# Patient Record
Sex: Male | Born: 1940 | Race: White | Hispanic: No | Marital: Married | Smoking: Former smoker
Health system: Southern US, Community
[De-identification: ages and names within clinical notes are randomized; demographics above are authoritative.]

## PROBLEM LIST (undated history)

## (undated) DIAGNOSIS — E7800 Pure hypercholesterolemia, unspecified: Secondary | ICD-10-CM

## (undated) DIAGNOSIS — I2510 Atherosclerotic heart disease of native coronary artery without angina pectoris: Secondary | ICD-10-CM

## (undated) DIAGNOSIS — J189 Pneumonia, unspecified organism: Secondary | ICD-10-CM

## (undated) DIAGNOSIS — I10 Essential (primary) hypertension: Secondary | ICD-10-CM

## (undated) DIAGNOSIS — E119 Type 2 diabetes mellitus without complications: Secondary | ICD-10-CM

## (undated) HISTORY — PX: BACK SURGERY: SHX140

## (undated) HISTORY — PX: ROTATOR CUFF REPAIR: SHX139

## (undated) HISTORY — PX: CARDIAC SURGERY: SHX584

## (undated) HISTORY — PX: CORONARY ARTERY BYPASS GRAFT: SHX141

## (undated) HISTORY — PX: APPENDECTOMY: SHX54

## (undated) HISTORY — PX: KNEE SURGERY: SHX244

---

## 2010-07-07 ENCOUNTER — Ambulatory Visit: Admission: RE | Admit: 2010-07-07 | Discharge: 2010-07-07 | Payer: Self-pay | Source: Home / Self Care

## 2010-07-08 ENCOUNTER — Ambulatory Visit: Admit: 2010-07-08 | Payer: Self-pay

## 2010-07-08 NOTE — Assessment & Plan Note (Unsigned)
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  The patient is doing well.  His sternum is stable.  His wounds are well healed.  He is walking for exercise.  He has seen the cardiologist and they are pleased with his progress.  The patient was reminded about his sternal precautions, not to lift anything more than 15 pounds for the next 6 weeks.  He will continue his followup with the cardiologist and he will return to see [place] on a p.r.n. basis.  Tera Mater. Arvilla Market, MD  BC/MEDQ  D:  07/07/2010  T:  07/08/2010  Job:  [id number]

## 2010-07-15 DIAGNOSIS — Z0279 Encounter for issue of other medical certificate: Secondary | ICD-10-CM

## 2013-02-11 ENCOUNTER — Encounter (HOSPITAL_BASED_OUTPATIENT_CLINIC_OR_DEPARTMENT_OTHER): Payer: Self-pay | Admitting: Emergency Medicine

## 2013-02-11 ENCOUNTER — Emergency Department (HOSPITAL_BASED_OUTPATIENT_CLINIC_OR_DEPARTMENT_OTHER)
Admission: EM | Admit: 2013-02-11 | Discharge: 2013-02-12 | Disposition: A | Discharge status: Home / Self Care | Payer: Medicare Other | Attending: Emergency Medicine | Admitting: Emergency Medicine

## 2013-02-11 DIAGNOSIS — C7A020 Malignant carcinoid tumor of the appendix: Secondary | ICD-10-CM | POA: Insufficient documentation

## 2013-02-11 DIAGNOSIS — E119 Type 2 diabetes mellitus without complications: Secondary | ICD-10-CM | POA: Insufficient documentation

## 2013-02-11 DIAGNOSIS — Z7982 Long term (current) use of aspirin: Secondary | ICD-10-CM | POA: Insufficient documentation

## 2013-02-11 DIAGNOSIS — Z951 Presence of aortocoronary bypass graft: Secondary | ICD-10-CM | POA: Insufficient documentation

## 2013-02-11 DIAGNOSIS — Z79899 Other long term (current) drug therapy: Secondary | ICD-10-CM | POA: Insufficient documentation

## 2013-02-11 DIAGNOSIS — N200 Calculus of kidney: Secondary | ICD-10-CM | POA: Insufficient documentation

## 2013-02-11 DIAGNOSIS — I2510 Atherosclerotic heart disease of native coronary artery without angina pectoris: Secondary | ICD-10-CM | POA: Insufficient documentation

## 2013-02-11 DIAGNOSIS — Z9889 Other specified postprocedural states: Secondary | ICD-10-CM | POA: Insufficient documentation

## 2013-02-11 DIAGNOSIS — E7800 Pure hypercholesterolemia, unspecified: Secondary | ICD-10-CM | POA: Insufficient documentation

## 2013-02-11 DIAGNOSIS — R112 Nausea with vomiting, unspecified: Secondary | ICD-10-CM | POA: Insufficient documentation

## 2013-02-11 DIAGNOSIS — D3A020 Benign carcinoid tumor of the appendix: Secondary | ICD-10-CM

## 2013-02-11 DIAGNOSIS — I10 Essential (primary) hypertension: Secondary | ICD-10-CM | POA: Insufficient documentation

## 2013-02-11 HISTORY — DX: Essential (primary) hypertension: I10

## 2013-02-11 HISTORY — DX: Pure hypercholesterolemia, unspecified: E78.00

## 2013-02-11 HISTORY — DX: Atherosclerotic heart disease of native coronary artery without angina pectoris: I25.10

## 2013-02-11 HISTORY — DX: Type 2 diabetes mellitus without complications: E11.9

## 2013-02-11 LAB — URINALYSIS, ROUTINE W REFLEX MICROSCOPIC
Glucose, UA: >1000 mg/dL — AB
Leukocytes, UA: NEGATIVE
Specific Gravity, Urine: 1.026 (ref 1.005–1.030)
pH: 5.5 (ref 5.0–8.0)

## 2013-02-11 LAB — URINE MICROSCOPIC-ADD ON

## 2013-02-11 LAB — CBC WITH DIFFERENTIAL/PLATELET
Basophils Absolute: 0 10*3/uL (ref 0.0–0.1)
HCT: 46.7 % (ref 39.0–52.0)
Hemoglobin: 16.7 g/dL (ref 13.0–17.0)
Lymphocytes Relative: 8 % — ABNORMAL LOW (ref 12–46)
Monocytes Absolute: 0.6 10*3/uL (ref 0.1–1.0)
Monocytes Relative: 5 % (ref 3–12)
Neutro Abs: 11.4 10*3/uL — ABNORMAL HIGH (ref 1.7–7.7)
Neutrophils Relative %: 87 % — ABNORMAL HIGH (ref 43–77)
RBC: 4.95 MIL/uL (ref 4.22–5.81)
WBC: 13.1 10*3/uL — ABNORMAL HIGH (ref 4.0–10.5)

## 2013-02-11 NOTE — ED Notes (Signed)
Pt states he is having LLQ abdominal pain since this am.  Pt vomited after eating lunch.  Pt states he feels bloated.

## 2013-02-12 ENCOUNTER — Emergency Department (HOSPITAL_BASED_OUTPATIENT_CLINIC_OR_DEPARTMENT_OTHER): Payer: Medicare Other

## 2013-02-12 LAB — BASIC METABOLIC PANEL
BUN: 26 mg/dL — ABNORMAL HIGH (ref 6–23)
CO2: 23 mEq/L (ref 19–32)
Chloride: 99 mEq/L (ref 96–112)
Creatinine, Ser: 1.1 mg/dL (ref 0.50–1.35)
GFR calc non Af Amer: 54 mL/min — ABNORMAL LOW (ref >90)
Glucose, Bld: 281 mg/dL — ABNORMAL HIGH (ref 70–99)
Potassium: 4.6 mEq/L (ref 3.5–5.1)
Potassium: 5.9 mEq/L — ABNORMAL HIGH (ref 3.5–5.1)
Sodium: 137 mEq/L (ref 135–145)

## 2013-02-12 MED ORDER — MORPHINE SULFATE 2 MG/ML IJ SOLN
2.0000 mg | Freq: Once | INTRAMUSCULAR | Status: AC
  Administered 2013-02-12: 2 mg via INTRAVENOUS
  Filled 2013-02-12: qty 1

## 2013-02-12 MED ORDER — ONDANSETRON HCL 4 MG/2ML IJ SOLN
4.0000 mg | Freq: Once | INTRAMUSCULAR | Status: AC
  Administered 2013-02-12: 4 mg via INTRAVENOUS
  Filled 2013-02-12: qty 2

## 2013-02-12 MED ORDER — HYDROCODONE-ACETAMINOPHEN 5-325 MG PO TABS: 1.0000 | ORAL_TABLET | Freq: Four times a day (QID) | ORAL | Status: DC | PRN

## 2013-02-12 MED ORDER — TAMSULOSIN HCL 0.4 MG PO CAPS: 0.4000 mg | ORAL_CAPSULE | Freq: Every day | ORAL | Status: DC

## 2013-02-12 MED ORDER — ONDANSETRON 8 MG PO TBDP: ORAL_TABLET | ORAL | Status: DC

## 2013-02-12 MED ORDER — KETOROLAC TROMETHAMINE 30 MG/ML IJ SOLN
30.0000 mg | Freq: Once | INTRAMUSCULAR | Status: AC
  Administered 2013-02-12: 30 mg via INTRAVENOUS
  Filled 2013-02-12: qty 1

## 2013-02-12 MED ORDER — IBUPROFEN 800 MG PO TABS: 800.0000 mg | ORAL_TABLET | Freq: Three times a day (TID) | ORAL | Status: DC

## 2013-02-12 MED ORDER — SULFAMETHOXAZOLE-TMP DS 800-160 MG PO TABS
1.0000 | ORAL_TABLET | Freq: Once | ORAL | Status: AC
  Administered 2013-02-12: 1 via ORAL
  Filled 2013-02-12: qty 1

## 2013-02-12 MED ORDER — SULFAMETHOXAZOLE-TMP DS 800-160 MG PO TABS: 1.0000 | ORAL_TABLET | Freq: Two times a day (BID) | ORAL | Status: DC

## 2013-02-12 NOTE — ED Notes (Signed)
Pt ambulating independently w/ steady gait on d/c in no acute distress, A&Ox4. D/c instructions reviewed w/ pt and family - pt and family deny any further questions or concerns at present. Rx given x5, Pt instructed to not use alcohol, drive, or operate heavy machinery while take the prescription pain medications as they could make him drowsy - pt verbalized understanding.

## 2013-02-12 NOTE — ED Provider Notes (Signed)
CSN: [id number]     Arrival date & time 02/11/13  2228 History   This chart was scribed for Michael Morton Smitty Cords, MD by Caryn Bee, ED Scribe. This patient was seen in room MH08/MH08 and the patient's care was started 12:12 AM.    Chief Complaint  Patient presents with  . Abdominal Pain  . Emesis   Patient is a 72 y.o. male presenting with abdominal pain and vomiting. The history is provided by the patient. No language interpreter was used.  Abdominal Pain Pain location:  L flank (right flank and inguinal area) Pain quality: aching and dull   Pain radiates to:  Does not radiate Pain severity:  Moderate Onset quality:  Gradual Duration:  1 day Timing:  Constant Progression:  Unchanged Chronicity:  New Context: not suspicious food intake   Relieved by:  Nothing Worsened by:  Nothing tried Associated symptoms: dysuria, hematuria, nausea and vomiting   Associated symptoms comment:  Hematuria Risk factors: being elderly   Emesis Associated symptoms: abdominal pain (right flank)    HPI Comments: Michael Morton is a 72 y.o. male who presents to the Emergency Department complaining of constant, unchanged left lower flank that began about 9 hours ago. He describes his pain as dull and aching. Pt has associated emesis (about 3-4 episodes today). He believes it may be related to a hamburger he ate today, but his wife thinks it's related to the intensity of the pain. Pt has associated dysuria and hematuria without clotting. His last normal BM was this morning. Pt feels bloated and states he was unable to pass gas today. Pt has a h/o kidney stones. Pt denies back pain or increased urinary frequency. He reports that he is not allergic to medications. Pt denies any h/o abdominal surgeries.   Past Medical History  Diagnosis Date  . Diabetes mellitus without complication   . Hypertension   . Hypercholesteremia   . Coronary artery disease    Past Surgical History  Procedure Laterality Date   . Cardiac surgery    . Back surgery    . Knee surgery    . Rotator cuff repair    . Coronary artery bypass graft     No family history on file. History  Substance Use Topics  . Smoking status: Former Games developer  . Smokeless tobacco: Not on file  . Alcohol Use: Yes    Review of Systems  Gastrointestinal: Positive for nausea, vomiting and abdominal pain (right flank).  Endocrine: Negative for polyuria.  Genitourinary: Positive for dysuria, hematuria and flank pain.  All other systems reviewed and are negative.    Allergies  Ace inhibitors and Oxycodone  Home Medications   Current Outpatient Rx  Name  Route  Sig  Dispense  Refill  . acyclovir (ZOVIRAX) 400 MG tablet   Oral   Take 400 mg by mouth every 4 (four) hours while awake.         Marland Kitchen aspirin 81 MG tablet   Oral   Take 81 mg by mouth daily.         Marland Kitchen etodolac (LODINE) 400 MG tablet   Oral   Take 400 mg by mouth 2 (two) times daily.         Marland Kitchen glyBURIDE (DIABETA) 5 MG tablet   Oral   Take 5 mg by mouth 2 (two) times daily with a meal.         . metoprolol (LOPRESSOR) 50 MG tablet   Oral   Take 50  mg by mouth 2 (two) times daily.         . nitroGLYCERIN (NITROSTAT) 0.4 MG SL tablet   Sublingual   Place 0.4 mg under the tongue every 5 (five) minutes as needed for chest pain.         . simvastatin (ZOCOR) 40 MG tablet   Oral   Take 40 mg by mouth every evening.         . traZODone (DESYREL) 50 MG tablet   Oral   Take 50 mg by mouth at bedtime.         Marland Kitchen venlafaxine (EFFEXOR) 50 MG tablet   Oral   Take 25 mg by mouth 3 (three) times daily.          BP 174/87  Pulse 85  Temp(Src) 98.7 F (37.1 C) (Oral)  Resp 16  Ht 6' (1.829 m)  Wt 200 lb (90.719 kg)  BMI 27.12 kg/m2  SpO2 99% Physical Exam  Nursing note and vitals reviewed. Constitutional: He is oriented to person, place, and time. He appears well-developed and well-nourished.  HENT:  Head: Normocephalic and atraumatic.   Mouth/Throat: Oropharynx is clear and moist.  Eyes: Pupils are equal, round, and reactive to light.  Neck: Normal range of motion. Neck supple.  Cardiovascular: Normal rate, regular rhythm and normal heart sounds.   Pulmonary/Chest: Effort normal and breath sounds normal. He has no wheezes. He has no rales.  Abdominal: Soft. Bowel sounds are normal. He exhibits no distension. There is no tenderness. There is no rebound and no guarding.  Musculoskeletal: Normal range of motion.  Neurological: He is alert and oriented to person, place, and time.  Skin: Skin is warm and dry.  Psychiatric: He has a normal mood and affect.    ED Course  Procedures (including critical care time) DIAGNOSTIC STUDIES: Oxygen Saturation is 99% on room air, normal by my interpretation.    COORDINATION OF CARE: 12:40 AM-Discussed treatment plan with pt at bedside and pt agreed to plan.   Labs Review Labs Reviewed  URINALYSIS, ROUTINE W REFLEX MICROSCOPIC - Abnormal; Notable for the following:    Glucose, UA >1000 (*)    Hgb urine dipstick TRACE (*)    Bilirubin Urine MODERATE (*)    Ketones, ur 15 (*)    All other components within normal limits  CBC WITH DIFFERENTIAL - Abnormal; Notable for the following:    WBC 13.1 (*)    Neutrophils Relative % 87 (*)    Neutro Abs 11.4 (*)    Lymphocytes Relative 8 (*)    All other components within normal limits  URINE MICROSCOPIC-ADD ON  BASIC METABOLIC PANEL  URINALYSIS, ROUTINE W REFLEX MICROSCOPIC   Imaging Review No results found.  MDM  No diagnosis found. Case d/w Dr. Ezzard Standing of surgery will need to follow up closely in the office for elective appendectomy.    Patient and wife informed of all CT findings including likely mass in the appendix.  Will need to follow up with urology and general surgery  Medical screening examination/treatment/procedure(s) were performed by non-physician practitioner and as supervising physician I was immediately available  for consultation/collaboration.     Jasmine Awe, MD 02/12/13 [phone]

## 2013-10-28 ENCOUNTER — Emergency Department (HOSPITAL_BASED_OUTPATIENT_CLINIC_OR_DEPARTMENT_OTHER): Payer: Medicare Other

## 2013-10-28 ENCOUNTER — Encounter (HOSPITAL_BASED_OUTPATIENT_CLINIC_OR_DEPARTMENT_OTHER): Payer: Self-pay | Admitting: Emergency Medicine

## 2013-10-28 ENCOUNTER — Emergency Department (HOSPITAL_BASED_OUTPATIENT_CLINIC_OR_DEPARTMENT_OTHER)
Admission: EM | Admit: 2013-10-28 | Discharge: 2013-10-28 | Disposition: A | Discharge status: Home / Self Care | Payer: Medicare Other | Attending: Emergency Medicine | Admitting: Emergency Medicine

## 2013-10-28 DIAGNOSIS — Z951 Presence of aortocoronary bypass graft: Secondary | ICD-10-CM | POA: Insufficient documentation

## 2013-10-28 DIAGNOSIS — R5381 Other malaise: Secondary | ICD-10-CM | POA: Insufficient documentation

## 2013-10-28 DIAGNOSIS — Z7982 Long term (current) use of aspirin: Secondary | ICD-10-CM | POA: Insufficient documentation

## 2013-10-28 DIAGNOSIS — E119 Type 2 diabetes mellitus without complications: Secondary | ICD-10-CM | POA: Insufficient documentation

## 2013-10-28 DIAGNOSIS — R079 Chest pain, unspecified: Secondary | ICD-10-CM | POA: Insufficient documentation

## 2013-10-28 DIAGNOSIS — Z792 Long term (current) use of antibiotics: Secondary | ICD-10-CM | POA: Insufficient documentation

## 2013-10-28 DIAGNOSIS — Z791 Long term (current) use of non-steroidal anti-inflammatories (NSAID): Secondary | ICD-10-CM | POA: Insufficient documentation

## 2013-10-28 DIAGNOSIS — IMO0001 Reserved for inherently not codable concepts without codable children: Secondary | ICD-10-CM | POA: Insufficient documentation

## 2013-10-28 DIAGNOSIS — I2510 Atherosclerotic heart disease of native coronary artery without angina pectoris: Secondary | ICD-10-CM | POA: Insufficient documentation

## 2013-10-28 DIAGNOSIS — R531 Weakness: Secondary | ICD-10-CM

## 2013-10-28 DIAGNOSIS — I10 Essential (primary) hypertension: Secondary | ICD-10-CM | POA: Insufficient documentation

## 2013-10-28 DIAGNOSIS — K5900 Constipation, unspecified: Secondary | ICD-10-CM | POA: Insufficient documentation

## 2013-10-28 DIAGNOSIS — Z79899 Other long term (current) drug therapy: Secondary | ICD-10-CM | POA: Insufficient documentation

## 2013-10-28 DIAGNOSIS — R5383 Other fatigue: Principal | ICD-10-CM

## 2013-10-28 DIAGNOSIS — Z8509 Personal history of malignant neoplasm of other digestive organs: Secondary | ICD-10-CM | POA: Insufficient documentation

## 2013-10-28 DIAGNOSIS — Z87891 Personal history of nicotine dependence: Secondary | ICD-10-CM | POA: Insufficient documentation

## 2013-10-28 DIAGNOSIS — L97509 Non-pressure chronic ulcer of other part of unspecified foot with unspecified severity: Secondary | ICD-10-CM | POA: Insufficient documentation

## 2013-10-28 DIAGNOSIS — E785 Hyperlipidemia, unspecified: Secondary | ICD-10-CM | POA: Insufficient documentation

## 2013-10-28 DIAGNOSIS — Z9889 Other specified postprocedural states: Secondary | ICD-10-CM | POA: Insufficient documentation

## 2013-10-28 DIAGNOSIS — R011 Cardiac murmur, unspecified: Secondary | ICD-10-CM | POA: Insufficient documentation

## 2013-10-28 LAB — CBC WITH DIFFERENTIAL/PLATELET
Basophils Absolute: 0 10*3/uL (ref 0.0–0.1)
Basophils Relative: 0 % (ref 0–1)
Eosinophils Absolute: 0.1 10*3/uL (ref 0.0–0.7)
Eosinophils Relative: 1 % (ref 0–5)
HCT: 38.6 % — ABNORMAL LOW (ref 39.0–52.0)
HEMOGLOBIN: 13.5 g/dL (ref 13.0–17.0)
LYMPHS ABS: 1.2 10*3/uL (ref 0.7–4.0)
LYMPHS PCT: 11 % — AB (ref 12–46)
MCH: 34.3 pg — ABNORMAL HIGH (ref 26.0–34.0)
MCHC: 35 g/dL (ref 30.0–36.0)
MCV: 98 fL (ref 78.0–100.0)
MONOS PCT: 12 % (ref 3–12)
Monocytes Absolute: 1.3 10*3/uL — ABNORMAL HIGH (ref 0.1–1.0)
NEUTROS ABS: 8.5 10*3/uL — AB (ref 1.7–7.7)
NEUTROS PCT: 77 % (ref 43–77)
Platelets: 167 10*3/uL (ref 150–400)
RBC: 3.94 MIL/uL — AB (ref 4.22–5.81)
RDW: 11.9 % (ref 11.5–15.5)
WBC: 11.1 10*3/uL — ABNORMAL HIGH (ref 4.0–10.5)

## 2013-10-28 LAB — COMPREHENSIVE METABOLIC PANEL
ALT: 27 U/L (ref 0–53)
AST: 16 U/L (ref 0–37)
Albumin: 3.5 g/dL (ref 3.5–5.2)
Alkaline Phosphatase: 99 U/L (ref 39–117)
BUN: 31 mg/dL — ABNORMAL HIGH (ref 6–23)
CALCIUM: 9.8 mg/dL (ref 8.4–10.5)
CO2: 23 meq/L (ref 19–32)
CREATININE: 1.3 mg/dL (ref 0.50–1.35)
Chloride: 100 mEq/L (ref 96–112)
GFR, EST AFRICAN AMERICAN: 62 mL/min — AB (ref >90)
GFR, EST NON AFRICAN AMERICAN: 54 mL/min — AB (ref >90)
GLUCOSE: 216 mg/dL — AB (ref 70–99)
Potassium: 4.9 mEq/L (ref 3.7–5.3)
Sodium: 139 mEq/L (ref 137–147)
Total Bilirubin: 0.7 mg/dL (ref 0.3–1.2)
Total Protein: 7.7 g/dL (ref 6.0–8.3)

## 2013-10-28 LAB — URINE MICROSCOPIC-ADD ON

## 2013-10-28 LAB — URINALYSIS, ROUTINE W REFLEX MICROSCOPIC
GLUCOSE, UA: NEGATIVE mg/dL
HGB URINE DIPSTICK: NEGATIVE
Ketones, ur: NEGATIVE mg/dL
Leukocytes, UA: NEGATIVE
Nitrite: NEGATIVE
Protein, ur: 30 mg/dL — AB
SPECIFIC GRAVITY, URINE: 1.026 (ref 1.005–1.030)
UROBILINOGEN UA: 0.2 mg/dL (ref 0.0–1.0)
pH: 5 (ref 5.0–8.0)

## 2013-10-28 LAB — CK: Total CK: 84 U/L (ref 7–232)

## 2013-10-28 LAB — TROPONIN I

## 2013-10-28 MED ORDER — SODIUM CHLORIDE 0.9 % IV BOLUS (SEPSIS)
1000.0000 mL | Freq: Once | INTRAVENOUS | Status: AC
Start: 2013-10-28 — End: 2013-10-28
  Administered 2013-10-28: 1000 mL via INTRAVENOUS

## 2013-10-28 MED ORDER — ACETAMINOPHEN 325 MG PO TABS
975.0000 mg | ORAL_TABLET | Freq: Once | ORAL | Status: AC
  Administered 2013-10-28: 975 mg via ORAL
  Filled 2013-10-28: qty 3

## 2013-10-28 NOTE — ED Notes (Signed)
Patient c/o generalized body aches and fatigue since Tuesday. Went to primary MD on Wednesday for labs, but no meds given

## 2013-10-28 NOTE — Discharge Instructions (Signed)
Stop amlodipine (norvasc) for the next 2 days. Take Tylenol every 4 hours as directed for aches as needed. Call your primary care physician to arrange to be seen in the office this week. Your blood pressure should be rechecked in the office urine, and you may need further evaluation Drink six eight ounce glasses of water daily. Return if you feel worse for any reason

## 2013-10-28 NOTE — ED Provider Notes (Signed)
CSN: [id number]     Arrival date & time 10/28/13  8099 History   First MD Initiated Contact with Patient 10/28/13 [phone]     Chief Complaint  Patient presents with  . Fatigue     (Consider location/radiation/quality/duration/timing/severity/associated sxs/prior Treatment) HPI Complains of bilateral rib pain, generalized weakness and diffuse bodyaches onset 5 days ago, constant. Symptoms accompanied by diminished appetite, constipation. Last bowel movement was yesterday after taking a laxative. No blood per rectum. Nothing makes symptoms better or worse. He did eat quesadilla and eggs , half of a hot dog and half a hamburger yesterday . He denies fever denies cough denies shortness of breath. rib pain is worse when he takes a deep breath. He saw his primary care physician 4 days ago stating "she didn't know what was ". He tried treating himself with Tamiflu, without relief. No other associated symptoms. No fever. No abdominal pain No urinary symptoms. No other associated symptoms Nothing makes symptoms better or worse. Past Medical History  Diagnosis Date  . Diabetes mellitus without complication   . Hypertension   . Hypercholesteremia   . Coronary artery disease    cancer the appendix, heart murmur Past Surgical History  Procedure Laterality Date  . Cardiac surgery    . Back surgery    . Knee surgery    . Rotator cuff repair    . Coronary artery bypass graft     appendectomy No family history on file. History  Substance Use Topics  . Smoking status: Former Research scientist (life sciences)  . Smokeless tobacco: Not on file  . Alcohol Use: Yes    Review of Systems  Constitutional: Positive for appetite change and fatigue.  HENT: Negative.   Respiratory: Negative.   Cardiovascular: Negative.        Bilateral rib pain  Gastrointestinal: Positive for constipation.  Musculoskeletal: Positive for myalgias.       Diffuse myalgias  Skin: Positive for wound.       Ulcer at right great toe x1.5 years   Neurological: Negative.   Psychiatric/Behavioral: Negative.   All other systems reviewed and are negative.     Allergies  Ace inhibitors and Oxycodone  Home Medications   Prior to Admission medications   Medication Sig Start Date End Date Taking? Authorizing Provider  acyclovir (ZOVIRAX) 400 MG tablet Take 400 mg by mouth every 4 (four) hours while awake.    Historical Provider, MD  aspirin 81 MG tablet Take 81 mg by mouth daily.    Historical Provider, MD  etodolac (LODINE) 400 MG tablet Take 400 mg by mouth 2 (two) times daily.    Historical Provider, MD  glyBURIDE (DIABETA) 5 MG tablet Take 5 mg by mouth 2 (two) times daily with a meal.    Historical Provider, MD  HYDROcodone-acetaminophen (NORCO) 5-325 MG per tablet Take 1 tablet by mouth every 6 (six) hours as needed for pain. 02/12/13   [name], MD  ibuprofen (ADVIL,MOTRIN) 800 MG tablet Take 1 tablet (800 mg total) by mouth 3 (three) times daily. 02/12/13   [name], MD  metoprolol (LOPRESSOR) 50 MG tablet Take 50 mg by mouth 2 (two) times daily.    Historical Provider, MD  nitroGLYCERIN (NITROSTAT) 0.4 MG SL tablet Place 0.4 mg under the tongue every 5 (five) minutes as needed for chest pain.    Historical Provider, MD  ondansetron (ZOFRAN ODT) 8 MG disintegrating tablet 8mg  ODT q8 hours prn nausea 02/12/13   April Alfonso Patten, MD  simvastatin (ZOCOR) 40 MG tablet Take 40 mg by mouth every evening.    Historical Provider, MD  sulfamethoxazole-trimethoprim (BACTRIM DS) 800-160 MG per tablet Take 1 tablet by mouth 2 (two) times daily. 02/12/13   [name], MD  tamsulosin (FLOMAX) 0.4 MG CAPS capsule Take 1 capsule (0.4 mg total) by mouth daily. 02/12/13   [name], MD  traZODone (DESYREL) 50 MG tablet Take 50 mg by mouth at bedtime.    Historical Provider, MD  venlafaxine (EFFEXOR) 50 MG tablet Take 25 mg by mouth 3 (three) times daily.    Historical Provider, MD   BP 127/61  Pulse  80  Temp(Src) 97.7 F (36.5 C) (Oral)  Resp 18  Ht 6\' 2"  (1.88 m)  Wt 198 lb (89.812 kg)  BMI 25.41 kg/m2  SpO2 100% Physical Exam  Nursing note and vitals reviewed. Constitutional: He appears well-developed and well-nourished. No distress.  HENT:  Head: Normocephalic and atraumatic.  Eyes: Conjunctivae are normal. Pupils are equal, round, and reactive to light.  Neck: Neck supple. No tracheal deviation present. No thyromegaly present.  Cardiovascular: Normal rate and regular rhythm.   Murmur heard. 2/6 systolic left sternal border  Pulmonary/Chest: Effort normal and breath sounds normal.  Abdominal: Soft. Bowel sounds are normal. He exhibits no distension. There is no tenderness.  Musculoskeletal: Normal range of motion. He exhibits no edema and no tenderness.  Neurological: He is alert. Coordination normal.  Skin: Skin is warm and dry. No rash noted.  Dime sized clean appearing ulcer at plantar surface of right great toe. No rash  Psychiatric: He has a normal mood and affect.    ED Course  Procedures (including critical care time) Labs Review Labs Reviewed - No data to display  Imaging Review No results found.   EKG Interpretation   Date/Time:  Sunday Oct 28 2013 07:26:07 EDT Ventricular Rate:  80 PR Interval:  178 QRS Duration: 146 QT Interval:  372 QTC Calculation: 429 R Axis:   86 Text Interpretation:  Normal sinus rhythm Right bundle branch block  Inferior infarct , age undetermined Anterolateral infarct , age  undetermined Abnormal ECG No old tracing to compare Confirmed by  Winfred Leeds  MD, [facility] [phone]) on 10/28/2013 7:31:05 AM     Chest xray viewed by me. Results for orders placed during the hospital encounter of 10/28/13  COMPREHENSIVE METABOLIC PANEL      Result Value Ref Range   Sodium 139  137 - 147 mEq/L   Potassium 4.9  3.7 - 5.3 mEq/L   Chloride 100  96 - 112 mEq/L   CO2 23  19 - 32 mEq/L   Glucose, Bld 216 (*) 70 - 99 mg/dL   BUN 31 (*) 6 - 23  mg/dL   Creatinine, Ser 1.30  0.50 - 1.35 mg/dL   Calcium 9.8  8.4 - 10.5 mg/dL   Total Protein 7.7  6.0 - 8.3 g/dL   Albumin 3.5  3.5 - 5.2 g/dL   AST 16  0 - 37 U/L   ALT 27  0 - 53 U/L   Alkaline Phosphatase 99  39 - 117 U/L   Total Bilirubin 0.7  0.3 - 1.2 mg/dL   GFR calc non Af Amer 54 (*) >90 mL/min   GFR calc Af Amer 62 (*) >90 mL/min  CBC WITH DIFFERENTIAL      Result Value Ref Range   WBC 11.1 (*) 4.0 - 10.5 K/uL   RBC 3.94 (*) 4.22 -  5.81 MIL/uL   Hemoglobin 13.5  13.0 - 17.0 g/dL   HCT 38.6 (*) 39.0 - 52.0 %   MCV 98.0  78.0 - 100.0 fL   MCH 34.3 (*) 26.0 - 34.0 pg   MCHC 35.0  30.0 - 36.0 g/dL   RDW 11.9  11.5 - 15.5 %   Platelets 167  150 - 400 K/uL   Neutrophils Relative % 77  43 - 77 %   Neutro Abs 8.5 (*) 1.7 - 7.7 K/uL   Lymphocytes Relative 11 (*) 12 - 46 %   Lymphs Abs 1.2  0.7 - 4.0 K/uL   Monocytes Relative 12  3 - 12 %   Monocytes Absolute 1.3 (*) 0.1 - 1.0 K/uL   Eosinophils Relative 1  0 - 5 %   Eosinophils Absolute 0.1  0.0 - 0.7 K/uL   Basophils Relative 0  0 - 1 %   Basophils Absolute 0.0  0.0 - 0.1 K/uL  URINALYSIS, ROUTINE W REFLEX MICROSCOPIC      Result Value Ref Range   Color, Urine YELLOW  YELLOW   APPearance CLOUDY (*) CLEAR   Specific Gravity, Urine 1.026  1.005 - 1.030   pH 5.0  5.0 - 8.0   Glucose, UA NEGATIVE  NEGATIVE mg/dL   Hgb urine dipstick NEGATIVE  NEGATIVE   Bilirubin Urine LARGE (*) NEGATIVE   Ketones, ur NEGATIVE  NEGATIVE mg/dL   Protein, ur 30 (*) NEGATIVE mg/dL   Urobilinogen, UA 0.2  0.0 - 1.0 mg/dL   Nitrite NEGATIVE  NEGATIVE   Leukocytes, UA NEGATIVE  NEGATIVE  TROPONIN I      Result Value Ref Range   Troponin I <0.30  <0.30 ng/mL  CK      Result Value Ref Range   Total CK 84  7 - 232 U/L  URINE MICROSCOPIC-ADD ON      Result Value Ref Range   Squamous Epithelial / LPF RARE  RARE   Bacteria, UA MANY (*) RARE   Crystals URIC ACID CRYSTALS (*) NEGATIVE   Urine-Other MUCOUS PRESENT     Dg Chest 2  View  10/28/2013   CLINICAL DATA:  Several week history of body aches  EXAM: CHEST  2 VIEW  COMPARISON:  Prior chest x-ray 05/28/2010  FINDINGS: Cardiac and mediastinal contours are within normal limits. Patient is status post median sternotomy with evidence of prior multivessel CABG. Trace atherosclerotic calcifications in the transverse aorta. No focal airspace consolidation, pulmonary edema, pleural effusion or pneumothorax. Central bronchitic changes and mild interstitial prominence are stable compared to prior. Multilevel degenerative change throughout the thoracic spine. No acute osseous abnormality there is a  IMPRESSION: No active cardiopulmonary disease.   Electronically Signed   By: Jacqulynn Cadet M.D.   On: 10/28/2013 07:49    10 AM feels much improved after treatment with intravenous fluids and Tylenol.  MDM  I suspect the patient feels weak secondary to mild dehydration and relative hypotension. He has a history of hypertension. He is mildly hyperglycemia which may be leading to dehydration Final diagnoses:  None   plan encourage oral hydration. Stop Norvasc for the next 2 days. Tylenol as directed for pain Diagnosis #1 generalized weakness #2 dehydration #3 myalgias #4 hyperglycemia #5 renal insufficiency     Orlie Dakin, MD 10/28/13 1012

## 2013-10-30 ENCOUNTER — Encounter (HOSPITAL_BASED_OUTPATIENT_CLINIC_OR_DEPARTMENT_OTHER): Payer: Self-pay | Admitting: Emergency Medicine

## 2013-10-30 ENCOUNTER — Emergency Department (HOSPITAL_BASED_OUTPATIENT_CLINIC_OR_DEPARTMENT_OTHER): Payer: Medicare Other

## 2013-10-30 ENCOUNTER — Emergency Department (HOSPITAL_BASED_OUTPATIENT_CLINIC_OR_DEPARTMENT_OTHER)
Admission: EM | Admit: 2013-10-30 | Discharge: 2013-10-31 | Disposition: A | Discharge status: Home / Self Care | Payer: Medicare Other | Source: Home / Self Care | Attending: Emergency Medicine | Admitting: Emergency Medicine

## 2013-10-30 DIAGNOSIS — Z951 Presence of aortocoronary bypass graft: Secondary | ICD-10-CM | POA: Insufficient documentation

## 2013-10-30 DIAGNOSIS — I10 Essential (primary) hypertension: Secondary | ICD-10-CM

## 2013-10-30 DIAGNOSIS — Z87891 Personal history of nicotine dependence: Secondary | ICD-10-CM | POA: Insufficient documentation

## 2013-10-30 DIAGNOSIS — E7800 Pure hypercholesterolemia, unspecified: Secondary | ICD-10-CM

## 2013-10-30 DIAGNOSIS — Z79899 Other long term (current) drug therapy: Secondary | ICD-10-CM

## 2013-10-30 DIAGNOSIS — J159 Unspecified bacterial pneumonia: Secondary | ICD-10-CM | POA: Insufficient documentation

## 2013-10-30 DIAGNOSIS — Z7982 Long term (current) use of aspirin: Secondary | ICD-10-CM

## 2013-10-30 DIAGNOSIS — Z791 Long term (current) use of non-steroidal anti-inflammatories (NSAID): Secondary | ICD-10-CM | POA: Insufficient documentation

## 2013-10-30 DIAGNOSIS — A419 Sepsis, unspecified organism: Secondary | ICD-10-CM | POA: Diagnosis not present

## 2013-10-30 DIAGNOSIS — I2510 Atherosclerotic heart disease of native coronary artery without angina pectoris: Secondary | ICD-10-CM

## 2013-10-30 DIAGNOSIS — Z87442 Personal history of urinary calculi: Secondary | ICD-10-CM

## 2013-10-30 DIAGNOSIS — Z792 Long term (current) use of antibiotics: Secondary | ICD-10-CM | POA: Insufficient documentation

## 2013-10-30 DIAGNOSIS — E119 Type 2 diabetes mellitus without complications: Secondary | ICD-10-CM | POA: Insufficient documentation

## 2013-10-30 DIAGNOSIS — J189 Pneumonia, unspecified organism: Secondary | ICD-10-CM

## 2013-10-30 DIAGNOSIS — G061 Intraspinal abscess and granuloma: Secondary | ICD-10-CM | POA: Diagnosis not present

## 2013-10-30 LAB — CBC WITH DIFFERENTIAL/PLATELET
Basophils Absolute: 0 10*3/uL (ref 0.0–0.1)
Basophils Relative: 0 % (ref 0–1)
EOS ABS: 0.1 10*3/uL (ref 0.0–0.7)
Eosinophils Relative: 1 % (ref 0–5)
HEMATOCRIT: 35.7 % — AB (ref 39.0–52.0)
Hemoglobin: 12.5 g/dL — ABNORMAL LOW (ref 13.0–17.0)
Lymphocytes Relative: 10 % — ABNORMAL LOW (ref 12–46)
Lymphs Abs: 1.2 10*3/uL (ref 0.7–4.0)
MCH: 34.4 pg — AB (ref 26.0–34.0)
MCHC: 35 g/dL (ref 30.0–36.0)
MCV: 98.3 fL (ref 78.0–100.0)
MONO ABS: 1.2 10*3/uL — AB (ref 0.1–1.0)
Monocytes Relative: 11 % (ref 3–12)
Neutro Abs: 8.9 10*3/uL — ABNORMAL HIGH (ref 1.7–7.7)
Neutrophils Relative %: 78 % — ABNORMAL HIGH (ref 43–77)
PLATELETS: 224 10*3/uL (ref 150–400)
RBC: 3.63 MIL/uL — ABNORMAL LOW (ref 4.22–5.81)
RDW: 12.2 % (ref 11.5–15.5)
WBC: 11.4 10*3/uL — ABNORMAL HIGH (ref 4.0–10.5)

## 2013-10-30 LAB — COMPREHENSIVE METABOLIC PANEL
ALT: 42 U/L (ref 0–53)
AST: 22 U/L (ref 0–37)
Albumin: 3.1 g/dL — ABNORMAL LOW (ref 3.5–5.2)
Alkaline Phosphatase: 130 U/L — ABNORMAL HIGH (ref 39–117)
BUN: 25 mg/dL — ABNORMAL HIGH (ref 6–23)
CALCIUM: 9.6 mg/dL (ref 8.4–10.5)
CO2: 24 mEq/L (ref 19–32)
Chloride: 100 mEq/L (ref 96–112)
Creatinine, Ser: 1.1 mg/dL (ref 0.50–1.35)
GFR calc non Af Amer: 66 mL/min — ABNORMAL LOW (ref >90)
GFR, EST AFRICAN AMERICAN: 76 mL/min — AB (ref >90)
Glucose, Bld: 243 mg/dL — ABNORMAL HIGH (ref 70–99)
Potassium: 4.4 mEq/L (ref 3.7–5.3)
SODIUM: 139 meq/L (ref 137–147)
TOTAL PROTEIN: 7.8 g/dL (ref 6.0–8.3)
Total Bilirubin: 0.5 mg/dL (ref 0.3–1.2)

## 2013-10-30 LAB — URINE MICROSCOPIC-ADD ON

## 2013-10-30 LAB — URINALYSIS, ROUTINE W REFLEX MICROSCOPIC
Glucose, UA: 500 mg/dL — AB
Hgb urine dipstick: NEGATIVE
KETONES UR: NEGATIVE mg/dL
LEUKOCYTES UA: NEGATIVE
NITRITE: NEGATIVE
PROTEIN: 30 mg/dL — AB
Specific Gravity, Urine: 1.024 (ref 1.005–1.030)
Urobilinogen, UA: 1 mg/dL (ref 0.0–1.0)
pH: 7 (ref 5.0–8.0)

## 2013-10-30 MED ORDER — IOHEXOL 350 MG/ML SOLN
100.0000 mL | Freq: Once | INTRAVENOUS | Status: AC | PRN
  Administered 2013-10-30: 100 mL via INTRAVENOUS

## 2013-10-30 MED ORDER — AZITHROMYCIN 250 MG PO TABS: 250.0000 mg | ORAL_TABLET | Freq: Every day | ORAL | Status: DC

## 2013-10-30 MED ORDER — SODIUM CHLORIDE 0.9 % IV SOLN
Freq: Once | INTRAVENOUS | Status: AC
  Administered 2013-10-30: 1000 mL via INTRAVENOUS

## 2013-10-30 MED ORDER — HYDROCODONE-ACETAMINOPHEN 5-325 MG PO TABS: 1.0000 | ORAL_TABLET | Freq: Four times a day (QID) | ORAL | Status: DC | PRN

## 2013-10-30 MED ORDER — AZITHROMYCIN 250 MG PO TABS
500.0000 mg | ORAL_TABLET | Freq: Once | ORAL | Status: AC
  Administered 2013-10-30: 500 mg via ORAL
  Filled 2013-10-30: qty 2

## 2013-10-30 MED ORDER — MORPHINE SULFATE 4 MG/ML IJ SOLN
4.0000 mg | Freq: Once | INTRAMUSCULAR | Status: AC
  Administered 2013-10-30: 4 mg via INTRAVENOUS
  Filled 2013-10-30: qty 1

## 2013-10-30 MED ORDER — ONDANSETRON HCL 4 MG/2ML IJ SOLN
4.0000 mg | Freq: Once | INTRAMUSCULAR | Status: AC
  Administered 2013-10-30: 4 mg via INTRAVENOUS
  Filled 2013-10-30: qty 2

## 2013-10-30 NOTE — ED Notes (Signed)
Patient transported to CT 

## 2013-10-30 NOTE — ED Notes (Signed)
Pain in his left lateral abdomen for a week. Started out as sore ribs then he thought he had a crick in his neck. Pain increases with deep breath.

## 2013-10-30 NOTE — ED Provider Notes (Addendum)
CSN: [id number]     Arrival date & time 10/30/13  1854 History  This chart was scribed for Michael Dessert, MD by Delphia Grates, ED Scribe. This patient was seen in room MH10/MH10 and the patient's care was started at 7:56 PM.    Chief Complaint  Patient presents with  . Abdominal Pain     The history is provided by the patient. No language interpreter was used.    HPI Comments: Michael Morton is a 73 y.o. male with history of DM and HTN who presents to the Emergency Department complaining of gradually worsening, intermittent chest pain in his ribs and left upper abd pain that began 1 week ago. Patient states the pain feels like it is "in his ribs", radiates to his back and shoulder, and is worse on the left side. Pain in ribs since Tuesday of last week and radiates to his back and shoulder blade. There is nonproductive cough and SOB. Patient states the pain is worsened with deep breathing. He denies fever, dysuria, hematuria or similar episodes. He reports hisotry of kidney stones, but states this pain does not feel similar.. Patient denies taking any long trips recently. Patient has an appointment tomorrow with a urologist. Patient has past surgical history of CABG, but reports no complications since the surgery. He also has past surgical history of an appendectomy due to a benign mass on his appendix. Patient has history of EtOH consumption. (1-2 beers per week).   Seen 2 days ago with normal cardiac markers and CXR but sx are worsening and felt he needed something for the pain.   Past Medical History  Diagnosis Date  . Diabetes mellitus without complication   . Hypertension   . Hypercholesteremia   . Coronary artery disease    Past Surgical History  Procedure Laterality Date  . Cardiac surgery    . Back surgery    . Knee surgery    . Rotator cuff repair    . Coronary artery bypass graft     No family history on file. History  Substance Use Topics  . Smoking status: Former  Research scientist (life sciences)  . Smokeless tobacco: Not on file  . Alcohol Use: Yes    Review of Systems  A complete 10 system review of systems was obtained and all systems are negative except as noted in the HPI and PMH.    Allergies  Ace inhibitors and Oxycodone  Home Medications   Prior to Admission medications   Medication Sig Start Date End Date Taking? Authorizing Provider  acyclovir (ZOVIRAX) 400 MG tablet Take 400 mg by mouth every 4 (four) hours while awake.    Historical Provider, MD  amLODipine (NORVASC) 5 MG tablet Take 5 mg by mouth daily.    Historical Provider, MD  aspirin 81 MG tablet Take 325 mg by mouth daily.     Historical Provider, MD  etodolac (LODINE) 400 MG tablet Take 400 mg by mouth 2 (two) times daily.    Historical Provider, MD  glyBURIDE (DIABETA) 5 MG tablet Take 5 mg by mouth 2 (two) times daily with a meal.    Historical Provider, MD  HYDROcodone-acetaminophen (NORCO) 5-325 MG per tablet Take 1 tablet by mouth every 6 (six) hours as needed for pain. 02/12/13   [name], MD  ibuprofen (ADVIL,MOTRIN) 800 MG tablet Take 1 tablet (800 mg total) by mouth 3 (three) times daily. 02/12/13   [name], MD  meloxicam (MOBIC) 7.5 MG tablet Take 7.5 mg by  mouth daily.    Historical Provider, MD  metoprolol (LOPRESSOR) 50 MG tablet Take 50 mg by mouth 2 (two) times daily.    Historical Provider, MD  nitroGLYCERIN (NITROSTAT) 0.4 MG SL tablet Place 0.4 mg under the tongue every 5 (five) minutes as needed for chest pain.    Historical Provider, MD  ondansetron (ZOFRAN ODT) 8 MG disintegrating tablet 8mg  ODT q8 hours prn nausea 02/12/13   [name], MD  sertraline (ZOLOFT) 50 MG tablet Take 50 mg by mouth daily.    Historical Provider, MD  simvastatin (ZOCOR) 40 MG tablet Take 40 mg by mouth every evening.    Historical Provider, MD  sulfamethoxazole-trimethoprim (BACTRIM DS) 800-160 MG per tablet Take 1 tablet by mouth 2 (two) times daily. 02/12/13   April K  Palumbo-Rasch, MD  tamsulosin (FLOMAX) 0.4 MG CAPS capsule Take 1 capsule (0.4 mg total) by mouth daily. 02/12/13   [name], MD  traZODone (DESYREL) 50 MG tablet Take 100 mg by mouth at bedtime.     Historical Provider, MD  venlafaxine (EFFEXOR) 50 MG tablet Take 25 mg by mouth 3 (three) times daily.    Historical Provider, MD   Triage Vitals: BP 161/72  Pulse 97  Temp(Src) 99 F (37.2 C) (Oral)  Resp 20  Ht 6\' 2"  (1.88 m)  Wt 198 lb (89.812 kg)  BMI 25.41 kg/m2  SpO2 92%  Physical Exam  Nursing note and vitals reviewed. Constitutional: He is oriented to person, place, and time. He appears well-developed and well-nourished. No distress.  HENT:  Head: Normocephalic and atraumatic.  Eyes: EOM are normal.  Neck: Neck supple. No tracheal deviation present.  Cardiovascular: Normal rate.   Pulmonary/Chest: Effort normal. No respiratory distress.  Abdominal: Soft. Bowel sounds are normal. There is no tenderness. There is no rebound and no guarding.  No flank pain.  Musculoskeletal: Normal range of motion.  Neurological: He is alert and oriented to person, place, and time.  Skin: Skin is warm and dry.  Psychiatric: He has a normal mood and affect. His behavior is normal.    ED Course  Procedures (including critical care time)  DIAGNOSTIC STUDIES: Oxygen Saturation is 92% on room air, adequate by my interpretation.    COORDINATION OF CARE: At 2007 Discussed treatment plan with patient which includes CT scan. Patient agrees.   Labs Review Labs Reviewed  URINALYSIS, ROUTINE W REFLEX MICROSCOPIC - Abnormal; Notable for the following:    Glucose, UA 500 (*)    Bilirubin Urine LARGE (*)    Protein, ur 30 (*)    All other components within normal limits  CBC WITH DIFFERENTIAL - Abnormal; Notable for the following:    WBC 11.4 (*)    RBC 3.63 (*)    Hemoglobin 12.5 (*)    HCT 35.7 (*)    MCH 34.4 (*)    Neutrophils Relative % 78 (*)    Neutro Abs 8.9 (*)     Lymphocytes Relative 10 (*)    Monocytes Absolute 1.2 (*)    All other components within normal limits  COMPREHENSIVE METABOLIC PANEL - Abnormal; Notable for the following:    Glucose, Bld 243 (*)    BUN 25 (*)    Albumin 3.1 (*)    Alkaline Phosphatase 130 (*)    GFR calc non Af Amer 66 (*)    GFR calc Af Amer 76 (*)    All other components within normal limits  URINE MICROSCOPIC-ADD ON  Imaging Review Ct Abdomen Pelvis Wo Contrast  10/30/2013   CLINICAL DATA:  LEFT flank pain, history kidney stones, hypertension, diabetes  EXAM: CT ABDOMEN AND PELVIS WITHOUT CONTRAST  TECHNIQUE: Multidetector CT imaging of the abdomen and pelvis was performed following the standard protocol without IV contrast. Sagittal and coronal MPR images reconstructed from axial data set.  COMPARISON:  03/20/2013  FINDINGS: Atelectasis at both lung bases with small LEFT pleural effusion new since previous exam.  Extensive atherosclerotic calcifications aorta and coronary arteries.  Nonobstructing tiny calculi in both kidneys.  No definite hydronephrosis or ureteral dilatation are identified but a tiny nonobstructing calculus is identified in the distal LEFT ureter.  Small cyst upper pole LEFT kidney again identified, 2.2 x 1.9 cm image 24.  Within limits of a nonenhanced exam, liver, spleen, pancreas, kidneys, and adrenal glands otherwise normal appearance.  Stomach and bowel loops unremarkable for technique.  Interval appendectomy.  Bladder and prostate gland unremarkable.  Question RIGHT inguinal hernia containing fat.  No mass, adenopathy, free fluid or inflammatory process.  IMPRESSION: Tiny nonobstructing distal LEFT ureteral calculus without hydronephrosis or hydroureter.  Additional BILATERAL nonobstructing renal calculi.  Stable LEFT renal cyst.  RIGHT inguinal hernia containing fat.  Extensive atherosclerotic disease.   Electronically Signed   By: Lavonia Dana M.D.   On: 10/30/2013 22:43   Ct Angio Chest Pe W/cm  &/or Wo Cm  10/30/2013   CLINICAL DATA:  Right shoulder pain, pleuritic left chest pain  EXAM: CT ANGIOGRAPHY CHEST WITH CONTRAST  TECHNIQUE: Multidetector CT imaging of the chest was performed using the standard protocol during bolus administration of intravenous contrast. Multiplanar CT image reconstructions and MIPs were obtained to evaluate the vascular anatomy.  CONTRAST:  143mL OMNIPAQUE IOHEXOL 350 MG/ML SOLN  COMPARISON:  10/28/2013 chest x-ray  FINDINGS: Pulmonary arteries are well visualized and patent. No filling defect or significant pulmonary embolus demonstrated by CTA. Normal heart size. No pericardial effusion. Prior coronary bypass changes noted. Atherosclerotic changes of the aorta. Negative for significant aneurysm or dissection. No mediastinal hemorrhage or hematoma. No adenopathy.  Small left pleural effusion noted.  Lung windows demonstrate left lower lobe and lingula compressive atelectasis/ consolidation. Pneumonia is not entirely excluded. Minimal right base atelectasis. Upper lobes remain clear. No pneumothorax. Trachea and central airways are patent. Calcified punctate granuloma noted within the left lower lobe.  Diffuse degenerative changes and osteophytes of the thoracic spine.  Review of the MIP images confirms the above findings.  IMPRESSION: Negative for significant acute pulmonary embolus by CTA.  Small left effusion with lingula and left lower lobe partial collapse/ consolidation.  Minimal right base atelectasis dependently  Calcified left lower lobe granuloma  Thoracic atherosclerosis without aneurysm or dissection   Electronically Signed   By: Daryll Brod M.D.   On: 10/30/2013 22:08     EKG Interpretation   Date/Time:  Tuesday Oct 30 2013 19:07:49 EDT Ventricular Rate:  98 PR Interval:  180 QRS Duration: 142 QT Interval:  364 QTC Calculation: 464 R Axis:   65 Text Interpretation:  Normal sinus rhythm Right bundle branch block  Inferior infarct , age undetermined  No significant change since last  tracing Confirmed by Maryan Rued  MD, Loree Fee ([id number]) on 10/30/2013 7:39:42 PM      MDM   Final diagnoses:  CAP (community acquired pneumonia)   Patient presents with pleuritic-type chest pain is bilateral but significantly worse in the left lower ribs left upper side. It is worse with deep breathing, movement.  He was seen approximately 2 days ago and had a full cardiac workup labs and a chest x-ray which all were within normal limits.  However because the pain is worsening he returned for further evaluation. He denies fever, productive cough, risk factors for PE, true abdominal pain, dysuria.  He has not taken anything for the pain. Does have a history of kidney stones a 5 mm obstructing stone approximately 9 months ago but states that this feels different.  Vital signs today patient has a low-grade temperature of 99, satting 93% on room air and a heart rate of 97. On exam he has significant pain when moving in bed and taking deep breaths but no reducible pain with palpation to the chest or abdomen.  Will ensure renal function has not changed with a CMP and CBC with persistent white blood cell count of 11,000 and normal hemoglobin. We'll do a CTA of his chest to evaluate for PE versus infectious etiology as well as a noncontrasted scan of the abdomen to evaluate for kidney stones. Patient given pain medication.  10:49 PM CT shows nonobstructing kidney stones. Also shows left lower lobe atelectasis versus consolidation cannot rule out pneumonia. Given patient's symptoms and pain feel that that is a likely cause for his issues at this time. We'll start him on antibiotics and pain control have a followup with his PCP.  I personally performed the services described in this documentation, which was scribed in my presence.  The recorded information has been reviewed and considered.    Michael Dessert, MD 10/30/13 [place], MD 10/30/13 [phone]

## 2013-11-01 ENCOUNTER — Emergency Department (HOSPITAL_BASED_OUTPATIENT_CLINIC_OR_DEPARTMENT_OTHER): Payer: Medicare Other

## 2013-11-01 ENCOUNTER — Emergency Department (HOSPITAL_BASED_OUTPATIENT_CLINIC_OR_DEPARTMENT_OTHER)
Admission: EM | Admit: 2013-11-01 | Discharge: 2013-11-02 | Disposition: A | Discharge status: Home / Self Care | Payer: Medicare Other | Source: Home / Self Care | Attending: Emergency Medicine | Admitting: Emergency Medicine

## 2013-11-01 ENCOUNTER — Other Ambulatory Visit: Payer: Self-pay

## 2013-11-01 ENCOUNTER — Encounter (HOSPITAL_BASED_OUTPATIENT_CLINIC_OR_DEPARTMENT_OTHER): Payer: Self-pay | Admitting: Emergency Medicine

## 2013-11-01 DIAGNOSIS — E119 Type 2 diabetes mellitus without complications: Secondary | ICD-10-CM | POA: Insufficient documentation

## 2013-11-01 DIAGNOSIS — IMO0001 Reserved for inherently not codable concepts without codable children: Secondary | ICD-10-CM

## 2013-11-01 DIAGNOSIS — Z79899 Other long term (current) drug therapy: Secondary | ICD-10-CM | POA: Insufficient documentation

## 2013-11-01 DIAGNOSIS — Z87891 Personal history of nicotine dependence: Secondary | ICD-10-CM

## 2013-11-01 DIAGNOSIS — Z791 Long term (current) use of non-steroidal anti-inflammatories (NSAID): Secondary | ICD-10-CM

## 2013-11-01 DIAGNOSIS — I2510 Atherosclerotic heart disease of native coronary artery without angina pectoris: Secondary | ICD-10-CM | POA: Insufficient documentation

## 2013-11-01 DIAGNOSIS — J90 Pleural effusion, not elsewhere classified: Secondary | ICD-10-CM

## 2013-11-01 DIAGNOSIS — E7800 Pure hypercholesterolemia, unspecified: Secondary | ICD-10-CM | POA: Insufficient documentation

## 2013-11-01 DIAGNOSIS — I10 Essential (primary) hypertension: Secondary | ICD-10-CM | POA: Insufficient documentation

## 2013-11-01 DIAGNOSIS — Z792 Long term (current) use of antibiotics: Secondary | ICD-10-CM

## 2013-11-01 DIAGNOSIS — M791 Myalgia, unspecified site: Secondary | ICD-10-CM

## 2013-11-01 DIAGNOSIS — Z8701 Personal history of pneumonia (recurrent): Secondary | ICD-10-CM

## 2013-11-01 DIAGNOSIS — Z951 Presence of aortocoronary bypass graft: Secondary | ICD-10-CM | POA: Insufficient documentation

## 2013-11-01 DIAGNOSIS — Z7982 Long term (current) use of aspirin: Secondary | ICD-10-CM

## 2013-11-01 DIAGNOSIS — Z9889 Other specified postprocedural states: Secondary | ICD-10-CM | POA: Insufficient documentation

## 2013-11-01 HISTORY — DX: Pneumonia, unspecified organism: J18.9

## 2013-11-01 LAB — CBC WITH DIFFERENTIAL/PLATELET
Basophils Absolute: 0 10*3/uL (ref 0.0–0.1)
Basophils Relative: 0 % (ref 0–1)
Eosinophils Absolute: 0.2 10*3/uL (ref 0.0–0.7)
Eosinophils Relative: 2 % (ref 0–5)
HEMATOCRIT: 31.8 % — AB (ref 39.0–52.0)
Hemoglobin: 11 g/dL — ABNORMAL LOW (ref 13.0–17.0)
Lymphocytes Relative: 9 % — ABNORMAL LOW (ref 12–46)
Lymphs Abs: 0.9 10*3/uL (ref 0.7–4.0)
MCH: 34.1 pg — ABNORMAL HIGH (ref 26.0–34.0)
MCHC: 34.6 g/dL (ref 30.0–36.0)
MCV: 98.5 fL (ref 78.0–100.0)
MONOS PCT: 9 % (ref 3–12)
Monocytes Absolute: 0.9 10*3/uL (ref 0.1–1.0)
NEUTROS ABS: 7.6 10*3/uL (ref 1.7–7.7)
Neutrophils Relative %: 80 % — ABNORMAL HIGH (ref 43–77)
Platelets: 271 10*3/uL (ref 150–400)
RBC: 3.23 MIL/uL — AB (ref 4.22–5.81)
RDW: 12.1 % (ref 11.5–15.5)
WBC: 9.6 10*3/uL (ref 4.0–10.5)

## 2013-11-01 LAB — BASIC METABOLIC PANEL
BUN: 30 mg/dL — AB (ref 6–23)
CHLORIDE: 99 meq/L (ref 96–112)
CO2: 21 meq/L (ref 19–32)
CREATININE: 1.3 mg/dL (ref 0.50–1.35)
Calcium: 9 mg/dL (ref 8.4–10.5)
GFR calc non Af Amer: 54 mL/min — ABNORMAL LOW (ref >90)
GFR, EST AFRICAN AMERICAN: 62 mL/min — AB (ref >90)
Glucose, Bld: 262 mg/dL — ABNORMAL HIGH (ref 70–99)
POTASSIUM: 4.4 meq/L (ref 3.7–5.3)
Sodium: 136 mEq/L — ABNORMAL LOW (ref 137–147)

## 2013-11-01 LAB — TROPONIN I: Troponin I: <0.3 ng/mL (ref <0.30)

## 2013-11-01 MED ORDER — METHOCARBAMOL 500 MG PO TABS
1000.0000 mg | ORAL_TABLET | Freq: Once | ORAL | Status: AC
  Administered 2013-11-01: 1000 mg via ORAL
  Filled 2013-11-01: qty 2

## 2013-11-01 MED ORDER — ACETAMINOPHEN 325 MG PO TABS
650.0000 mg | ORAL_TABLET | Freq: Once | ORAL | Status: AC
  Administered 2013-11-01: 650 mg via ORAL
  Filled 2013-11-01: qty 2

## 2013-11-01 MED ORDER — KETOROLAC TROMETHAMINE 60 MG/2ML IM SOLN
60.0000 mg | Freq: Once | INTRAMUSCULAR | Status: AC
  Administered 2013-11-01: 60 mg via INTRAMUSCULAR
  Filled 2013-11-01: qty 2

## 2013-11-01 NOTE — ED Notes (Signed)
Intermittent right scapula, shoulder and arm pain x2 weeks. Right flank pain.  Bil neck pain.  Sts he was diagnosed with pneumonia here 2 days ago. Sts he was checked for "about everything" when he was here but it is getting worse.

## 2013-11-01 NOTE — ED Provider Notes (Signed)
CSN: [id number]     Arrival date & time 11/01/13  2039 History   First MD Initiated Contact with Patient 11/01/13 2259     Chief Complaint  Patient presents with  . Arm Pain     (Consider location/radiation/quality/duration/timing/severity/associated sxs/prior Treatment) Patient is a 73 y.o. male presenting with arm pain. The history is provided by the patient and the spouse.  Arm Pain This is a chronic problem. The current episode started more than 1 week ago. The problem occurs constantly. The problem has not changed since onset.Pertinent negatives include no chest pain, no abdominal pain, no headaches and no shortness of breath. Nothing aggravates the symptoms. Nothing relieves the symptoms. He has tried nothing for the symptoms. The treatment provided no relief.  Seen and diagnosed with pneumonia after chest and abdomen CT 36 hours ago but not better  Past Medical History  Diagnosis Date  . Diabetes mellitus without complication   . Hypertension   . Hypercholesteremia   . Coronary artery disease   . Pneumonia    Past Surgical History  Procedure Laterality Date  . Cardiac surgery    . Back surgery    . Knee surgery    . Rotator cuff repair    . Coronary artery bypass graft    . Appendectomy     No family history on file. History  Substance Use Topics  . Smoking status: Former Research scientist (life sciences)  . Smokeless tobacco: Not on file  . Alcohol Use: Yes    Review of Systems  Constitutional: Negative for fever.  Respiratory: Negative for cough and shortness of breath.   Cardiovascular: Negative for chest pain.  Gastrointestinal: Negative for abdominal pain.  Musculoskeletal: Negative for neck pain and neck stiffness.  Neurological: Negative for headaches.  All other systems reviewed and are negative.     Allergies  Ace inhibitors and Oxycodone  Home Medications   Prior to Admission medications   Medication Sig Start Date End Date Taking? Authorizing Provider  acyclovir  (ZOVIRAX) 400 MG tablet Take 400 mg by mouth every 4 (four) hours while awake.    Historical Provider, MD  amLODipine (NORVASC) 5 MG tablet Take 5 mg by mouth daily.    Historical Provider, MD  aspirin 81 MG tablet Take 325 mg by mouth daily.     Historical Provider, MD  azithromycin (ZITHROMAX) 250 MG tablet Take 1 tablet (250 mg total) by mouth daily. Take first 2 tablets together, then 1 every day until finished. 10/30/13   [name], MD  etodolac (LODINE) 400 MG tablet Take 400 mg by mouth 2 (two) times daily.    Historical Provider, MD  glyBURIDE (DIABETA) 5 MG tablet Take 5 mg by mouth 2 (two) times daily with a meal.    Historical Provider, MD  HYDROcodone-acetaminophen (NORCO) 5-325 MG per tablet Take 1 tablet by mouth every 6 (six) hours as needed for pain. 02/12/13   Sakina Briones K Keevan Wolz-Rasch, MD  HYDROcodone-acetaminophen (NORCO/VICODIN) 5-325 MG per tablet Take 1-2 tablets by mouth every 6 (six) hours as needed. 10/30/13   [name], MD  ibuprofen (ADVIL,MOTRIN) 800 MG tablet Take 1 tablet (800 mg total) by mouth 3 (three) times daily. 02/12/13   Mauri Tolen K Breely Panik-Rasch, MD  meloxicam (MOBIC) 7.5 MG tablet Take 7.5 mg by mouth daily.    Historical Provider, MD  metoprolol (LOPRESSOR) 50 MG tablet Take 50 mg by mouth 2 (two) times daily.    Historical Provider, MD  nitroGLYCERIN (NITROSTAT) 0.4 MG SL tablet Place  0.4 mg under the tongue every 5 (five) minutes as needed for chest pain.    Historical Provider, MD  ondansetron (ZOFRAN ODT) 8 MG disintegrating tablet 8mg  ODT q8 hours prn nausea 02/12/13   Shateria Paternostro K Aldrick Derrig-Rasch, MD  sertraline (ZOLOFT) 50 MG tablet Take 50 mg by mouth daily.    Historical Provider, MD  simvastatin (ZOCOR) 40 MG tablet Take 40 mg by mouth every evening.    Historical Provider, MD  sulfamethoxazole-trimethoprim (BACTRIM DS) 800-160 MG per tablet Take 1 tablet by mouth 2 (two) times daily. 02/12/13   Maelie Chriswell K Tauriel Scronce-Rasch, MD  tamsulosin (FLOMAX) 0.4 MG CAPS capsule  Take 1 capsule (0.4 mg total) by mouth daily. 02/12/13   Gurpreet Mikhail K Emmerson Taddei-Rasch, MD  traZODone (DESYREL) 50 MG tablet Take 100 mg by mouth at bedtime.     Historical Provider, MD  venlafaxine (EFFEXOR) 50 MG tablet Take 25 mg by mouth 3 (three) times daily.    Historical Provider, MD   BP 159/58  Pulse 96  Temp(Src) 100.2 F (37.9 C) (Oral)  Resp 20  Ht 6\' 2"  (1.88 m)  Wt 198 lb (89.812 kg)  BMI 25.41 kg/m2  SpO2 92% Physical Exam  Constitutional: He is oriented to person, place, and time. He appears well-developed and well-nourished. No distress.  HENT:  Head: Normocephalic and atraumatic.  Mouth/Throat: Oropharynx is clear and moist.  Eyes: Conjunctivae are normal. Pupils are equal, round, and reactive to light.  Neck: Normal range of motion. Neck supple. No tracheal deviation present.  No point tenderness over the c spine no stiffness FROM minimal spasm in the right trapezius  Cardiovascular: Normal rate, regular rhythm and intact distal pulses.   Pulmonary/Chest: Effort normal and breath sounds normal. No stridor. He has no wheezes. He has no rales.  Abdominal: Soft. Bowel sounds are normal. There is no tenderness. There is no rebound and no guarding.  Musculoskeletal: Normal range of motion. He exhibits no edema and no tenderness.  No winging of the scapula on the right 5/5 x 4 extremities.  Intact DTRs.  Negative neers test right hand is neurovascularly intact  Lymphadenopathy:    He has no cervical adenopathy.  Neurological: He is alert and oriented to person, place, and time. He has normal reflexes.  Skin: Skin is warm and dry. No rash noted. No erythema.  Psychiatric: He has a normal mood and affect.    ED Course  Procedures (including critical care time) Labs Review Labs Reviewed  CBC WITH DIFFERENTIAL  BASIC METABOLIC PANEL  TROPONIN I    Imaging Review No results found.   EKG Interpretation None      MDM   Final diagnoses:  None     Date: 11/02/2013   Rate: 85  Rhythm: normal sinus rhythm  QRS Axis: normal  Intervals: normal  ST/T Wave abnormalities: normal  Conduction Disutrbances:right bundle branch block  Narrative Interpretation:   Old EKG Reviewed: unchanged  Has been ruled out for PE, ACS and all abdominal pathology this week.  Suspect MSK pain.  Pain is on the right not the left Will change antibiotics to augmentin for 10 days. Will add NSAIDs and muscle relaxants to pain regimen.  Follow up with your doctor for advanced testing and care    Vasiliy Mccarry K Salah Nakamura-Rasch, MD 11/02/13 [phone]

## 2013-11-02 MED ORDER — METHOCARBAMOL 500 MG PO TABS: 500.0000 mg | ORAL_TABLET | Freq: Two times a day (BID) | ORAL | Status: DC

## 2013-11-02 MED ORDER — AMOXICILLIN-POT CLAVULANATE 875-125 MG PO TABS: 1.0000 | ORAL_TABLET | Freq: Two times a day (BID) | ORAL | Status: DC

## 2013-11-02 MED ORDER — MELOXICAM 7.5 MG PO TABS
7.5000 mg | ORAL_TABLET | Freq: Every day | ORAL | Status: DC
Start: 2013-11-02 — End: 2018-10-02

## 2013-11-02 NOTE — Patient Instructions (Signed)
Instructed patient on the proper use of a flutter valve. Patient able to achieve X 10 without difficulty.

## 2013-11-03 ENCOUNTER — Emergency Department (HOSPITAL_COMMUNITY): Payer: Medicare Other

## 2013-11-03 ENCOUNTER — Inpatient Hospital Stay (HOSPITAL_COMMUNITY)
Admission: EM | Admit: 2013-11-03 | Discharge: 2013-11-13 | DRG: 853 | Disposition: A | Discharge status: Home Health | Payer: Medicare Other | Attending: Neurosurgery | Admitting: Neurosurgery

## 2013-11-03 ENCOUNTER — Encounter (HOSPITAL_COMMUNITY): Payer: Self-pay | Admitting: Emergency Medicine

## 2013-11-03 ENCOUNTER — Encounter (HOSPITAL_COMMUNITY)
Admission: EM | Disposition: A | Discharge status: Home Health | Payer: Self-pay | Source: Home / Self Care | Attending: Neurosurgery

## 2013-11-03 ENCOUNTER — Encounter (HOSPITAL_COMMUNITY): Payer: Medicare Other | Admitting: Anesthesiology

## 2013-11-03 ENCOUNTER — Emergency Department (HOSPITAL_COMMUNITY): Payer: Medicare Other | Admitting: Anesthesiology

## 2013-11-03 DIAGNOSIS — F329 Major depressive disorder, single episode, unspecified: Secondary | ICD-10-CM | POA: Diagnosis present

## 2013-11-03 DIAGNOSIS — G4733 Obstructive sleep apnea (adult) (pediatric): Secondary | ICD-10-CM | POA: Diagnosis present

## 2013-11-03 DIAGNOSIS — R0902 Hypoxemia: Secondary | ICD-10-CM

## 2013-11-03 DIAGNOSIS — A419 Sepsis, unspecified organism: Principal | ICD-10-CM

## 2013-11-03 DIAGNOSIS — J90 Pleural effusion, not elsewhere classified: Secondary | ICD-10-CM | POA: Diagnosis present

## 2013-11-03 DIAGNOSIS — R209 Unspecified disturbances of skin sensation: Secondary | ICD-10-CM | POA: Diagnosis present

## 2013-11-03 DIAGNOSIS — A4901 Methicillin susceptible Staphylococcus aureus infection, unspecified site: Secondary | ICD-10-CM | POA: Diagnosis present

## 2013-11-03 DIAGNOSIS — Z7982 Long term (current) use of aspirin: Secondary | ICD-10-CM

## 2013-11-03 DIAGNOSIS — G061 Intraspinal abscess and granuloma: Secondary | ICD-10-CM | POA: Diagnosis present

## 2013-11-03 DIAGNOSIS — L97509 Non-pressure chronic ulcer of other part of unspecified foot with unspecified severity: Secondary | ICD-10-CM | POA: Diagnosis present

## 2013-11-03 DIAGNOSIS — I10 Essential (primary) hypertension: Secondary | ICD-10-CM | POA: Diagnosis present

## 2013-11-03 DIAGNOSIS — I2510 Atherosclerotic heart disease of native coronary artery without angina pectoris: Secondary | ICD-10-CM | POA: Diagnosis present

## 2013-11-03 DIAGNOSIS — Z79899 Other long term (current) drug therapy: Secondary | ICD-10-CM

## 2013-11-03 DIAGNOSIS — N200 Calculus of kidney: Secondary | ICD-10-CM | POA: Diagnosis present

## 2013-11-03 DIAGNOSIS — Z951 Presence of aortocoronary bypass graft: Secondary | ICD-10-CM

## 2013-11-03 DIAGNOSIS — F3289 Other specified depressive episodes: Secondary | ICD-10-CM | POA: Diagnosis present

## 2013-11-03 DIAGNOSIS — E1169 Type 2 diabetes mellitus with other specified complication: Secondary | ICD-10-CM | POA: Diagnosis present

## 2013-11-03 DIAGNOSIS — N138 Other obstructive and reflux uropathy: Secondary | ICD-10-CM | POA: Diagnosis present

## 2013-11-03 DIAGNOSIS — N401 Enlarged prostate with lower urinary tract symptoms: Secondary | ICD-10-CM | POA: Diagnosis present

## 2013-11-03 DIAGNOSIS — J9819 Other pulmonary collapse: Secondary | ICD-10-CM | POA: Diagnosis present

## 2013-11-03 DIAGNOSIS — R7881 Bacteremia: Secondary | ICD-10-CM

## 2013-11-03 DIAGNOSIS — J9811 Atelectasis: Secondary | ICD-10-CM

## 2013-11-03 DIAGNOSIS — J189 Pneumonia, unspecified organism: Secondary | ICD-10-CM | POA: Diagnosis present

## 2013-11-03 DIAGNOSIS — D649 Anemia, unspecified: Secondary | ICD-10-CM | POA: Diagnosis present

## 2013-11-03 DIAGNOSIS — Z87891 Personal history of nicotine dependence: Secondary | ICD-10-CM

## 2013-11-03 DIAGNOSIS — E871 Hypo-osmolality and hyponatremia: Secondary | ICD-10-CM | POA: Diagnosis present

## 2013-11-03 DIAGNOSIS — E785 Hyperlipidemia, unspecified: Secondary | ICD-10-CM | POA: Diagnosis present

## 2013-11-03 HISTORY — PX: POSTERIOR CERVICAL LAMINECTOMY FOR EPIDURAL ABSCESS: SHX6034

## 2013-11-03 LAB — CBC WITH DIFFERENTIAL/PLATELET
BASOS ABS: 0 10*3/uL (ref 0.0–0.1)
BASOS PCT: 0 % (ref 0–1)
Eosinophils Absolute: 0.1 10*3/uL (ref 0.0–0.7)
Eosinophils Relative: 1 % (ref 0–5)
HCT: 31.6 % — ABNORMAL LOW (ref 39.0–52.0)
Hemoglobin: 10.9 g/dL — ABNORMAL LOW (ref 13.0–17.0)
LYMPHS PCT: 9 % — AB (ref 12–46)
Lymphs Abs: 0.9 10*3/uL (ref 0.7–4.0)
MCH: 33 pg (ref 26.0–34.0)
MCHC: 34.5 g/dL (ref 30.0–36.0)
MCV: 95.8 fL (ref 78.0–100.0)
Monocytes Absolute: 0.8 10*3/uL (ref 0.1–1.0)
Monocytes Relative: 8 % (ref 3–12)
NEUTROS ABS: 8 10*3/uL — AB (ref 1.7–7.7)
NEUTROS PCT: 82 % — AB (ref 43–77)
PLATELETS: 303 10*3/uL (ref 150–400)
RBC: 3.3 MIL/uL — ABNORMAL LOW (ref 4.22–5.81)
RDW: 12.6 % (ref 11.5–15.5)
WBC: 9.7 10*3/uL (ref 4.0–10.5)

## 2013-11-03 LAB — COMPREHENSIVE METABOLIC PANEL
ALT: 36 U/L (ref 0–53)
AST: 16 U/L (ref 0–37)
Albumin: 2.3 g/dL — ABNORMAL LOW (ref 3.5–5.2)
Alkaline Phosphatase: 146 U/L — ABNORMAL HIGH (ref 39–117)
BILIRUBIN TOTAL: 0.5 mg/dL (ref 0.3–1.2)
BUN: 22 mg/dL (ref 6–23)
CHLORIDE: 99 meq/L (ref 96–112)
CO2: 21 meq/L (ref 19–32)
CREATININE: 0.97 mg/dL (ref 0.50–1.35)
Calcium: 9 mg/dL (ref 8.4–10.5)
GFR calc Af Amer: >90 mL/min (ref >90)
GFR calc non Af Amer: 81 mL/min — ABNORMAL LOW (ref >90)
Glucose, Bld: 207 mg/dL — ABNORMAL HIGH (ref 70–99)
Potassium: 4.9 mEq/L (ref 3.7–5.3)
SODIUM: 131 meq/L — AB (ref 137–147)
Total Protein: 7 g/dL (ref 6.0–8.3)

## 2013-11-03 LAB — SEDIMENTATION RATE: Sed Rate: 122 mm/hr — ABNORMAL HIGH (ref 0–16)

## 2013-11-03 LAB — CBG MONITORING, ED: GLUCOSE-CAPILLARY: 202 mg/dL — AB (ref 70–99)

## 2013-11-03 SURGERY — POSTERIOR CERVICAL LAMINECTOMY FOR EPIDURAL ABSCESS
Anesthesia: General | Site: Neck

## 2013-11-03 MED ORDER — GLYCOPYRROLATE 0.2 MG/ML IJ SOLN
INTRAMUSCULAR | Status: AC
  Filled 2013-11-03: qty 1

## 2013-11-03 MED ORDER — PROPOFOL 10 MG/ML IV BOLUS
INTRAVENOUS | Status: AC
  Filled 2013-11-03: qty 20

## 2013-11-03 MED ORDER — SUCCINYLCHOLINE CHLORIDE 20 MG/ML IJ SOLN
INTRAMUSCULAR | Status: AC
  Filled 2013-11-03: qty 1

## 2013-11-03 MED ORDER — FENTANYL CITRATE 0.05 MG/ML IJ SOLN
50.0000 ug | Freq: Once | INTRAMUSCULAR | Status: AC
  Administered 2013-11-03: 50 ug via INTRAVENOUS

## 2013-11-03 MED ORDER — ROCURONIUM BROMIDE 100 MG/10ML IV SOLN
INTRAVENOUS | Status: DC | PRN
Start: 2013-11-03 — End: 2013-11-03
  Administered 2013-11-03: 20 mg via INTRAVENOUS
  Administered 2013-11-03: 10 mg via INTRAVENOUS
  Administered 2013-11-03: 20 mg via INTRAVENOUS

## 2013-11-03 MED ORDER — ROCURONIUM BROMIDE 50 MG/5ML IV SOLN
INTRAVENOUS | Status: AC
  Filled 2013-11-03: qty 1

## 2013-11-03 MED ORDER — ONDANSETRON HCL 4 MG/2ML IJ SOLN
4.0000 mg | INTRAMUSCULAR | Status: DC | PRN
  Administered 2013-11-03 (×2): 4 mg via INTRAVENOUS
  Filled 2013-11-03 (×2): qty 2

## 2013-11-03 MED ORDER — VANCOMYCIN HCL 1000 MG IV SOLR
1000.0000 mg | INTRAVENOUS | Status: DC | PRN
  Administered 2013-11-03: 1000 mg via INTRAVENOUS

## 2013-11-03 MED ORDER — DEXTROSE 5 % IV SOLN
INTRAVENOUS | Status: DC | PRN
  Administered 2013-11-03: 22:00:00 via INTRAVENOUS

## 2013-11-03 MED ORDER — LACTATED RINGERS IV SOLN
INTRAVENOUS | Status: DC | PRN
  Administered 2013-11-03 (×2): via INTRAVENOUS

## 2013-11-03 MED ORDER — PROPOFOL 10 MG/ML IV BOLUS
INTRAVENOUS | Status: DC | PRN
  Administered 2013-11-03: 100 mg via INTRAVENOUS

## 2013-11-03 MED ORDER — LIDOCAINE HCL (CARDIAC) 20 MG/ML IV SOLN
INTRAVENOUS | Status: AC
  Filled 2013-11-03: qty 5

## 2013-11-03 MED ORDER — MIDAZOLAM HCL 2 MG/2ML IJ SOLN
INTRAMUSCULAR | Status: AC
  Filled 2013-11-03: qty 2

## 2013-11-03 MED ORDER — GADOBENATE DIMEGLUMINE 529 MG/ML IV SOLN
20.0000 mL | Freq: Once | INTRAVENOUS | Status: AC | PRN
  Administered 2013-11-03: 20 mL via INTRAVENOUS

## 2013-11-03 MED ORDER — FENTANYL CITRATE 0.05 MG/ML IJ SOLN
100.0000 ug | INTRAMUSCULAR | Status: AC | PRN
  Administered 2013-11-03: 100 ug via INTRAVENOUS
  Administered 2013-11-03 (×3): 50 ug via INTRAVENOUS
  Administered 2013-11-03: 100 ug via INTRAVENOUS
  Administered 2013-11-03: 50 ug via INTRAVENOUS
  Filled 2013-11-03 (×5): qty 2

## 2013-11-03 MED ORDER — SODIUM CHLORIDE 0.9 % IV BOLUS (SEPSIS)
500.0000 mL | Freq: Once | INTRAVENOUS | Status: AC
  Administered 2013-11-03: 500 mL via INTRAVENOUS

## 2013-11-03 MED ORDER — LORAZEPAM 2 MG/ML IJ SOLN
0.2500 mg | Freq: Once | INTRAMUSCULAR | Status: AC
  Administered 2013-11-03: 0.25 mg via INTRAVENOUS

## 2013-11-03 MED ORDER — GLYCOPYRROLATE 0.2 MG/ML IJ SOLN
INTRAMUSCULAR | Status: DC | PRN
  Administered 2013-11-03: 0.4 mg via INTRAVENOUS

## 2013-11-03 MED ORDER — FENTANYL CITRATE 0.05 MG/ML IJ SOLN
INTRAMUSCULAR | Status: DC | PRN
  Administered 2013-11-03 (×2): 100 ug via INTRAVENOUS
  Administered 2013-11-03: 50 ug via INTRAVENOUS

## 2013-11-03 MED ORDER — LIDOCAINE HCL (CARDIAC) 20 MG/ML IV SOLN
INTRAVENOUS | Status: DC | PRN
  Administered 2013-11-03: 50 mg via INTRAVENOUS

## 2013-11-03 MED ORDER — FENTANYL CITRATE 0.05 MG/ML IJ SOLN
INTRAMUSCULAR | Status: AC
  Filled 2013-11-03: qty 5

## 2013-11-03 MED ORDER — BUPIVACAINE HCL (PF) 0.5 % IJ SOLN
INTRAMUSCULAR | Status: DC | PRN
  Administered 2013-11-03: 30 mL

## 2013-11-03 MED ORDER — LORAZEPAM 2 MG/ML IJ SOLN
2.0000 mg | Freq: Once | INTRAMUSCULAR | Status: AC
  Administered 2013-11-03: 0.5 mg via INTRAVENOUS

## 2013-11-03 MED ORDER — NEOSTIGMINE METHYLSULFATE 10 MG/10ML IV SOLN
INTRAVENOUS | Status: DC | PRN
  Administered 2013-11-03: 3.5 mg via INTRAVENOUS

## 2013-11-03 MED ORDER — SUCCINYLCHOLINE CHLORIDE 20 MG/ML IJ SOLN
INTRAMUSCULAR | Status: DC | PRN
  Administered 2013-11-03: 120 mg via INTRAVENOUS

## 2013-11-03 MED ORDER — HYDROMORPHONE HCL PF 1 MG/ML IJ SOLN
0.2500 mg | INTRAMUSCULAR | Status: DC | PRN
  Administered 2013-11-03 – 2013-11-04 (×2): 0.5 mg via INTRAVENOUS
  Administered 2013-11-04: 0.25 mg via INTRAVENOUS
  Filled 2013-11-03: qty 1

## 2013-11-03 MED ORDER — HYDROMORPHONE HCL PF 1 MG/ML IJ SOLN
INTRAMUSCULAR | Status: AC
  Filled 2013-11-03: qty 1

## 2013-11-03 MED ORDER — 0.9 % SODIUM CHLORIDE (POUR BTL) OPTIME
TOPICAL | Status: DC | PRN
  Administered 2013-11-03: 1000 mL

## 2013-11-03 MED ORDER — THROMBIN 20000 UNITS EX KIT
PACK | CUTANEOUS | Status: DC | PRN
  Administered 2013-11-03: 22:00:00 via TOPICAL

## 2013-11-03 MED ORDER — LORAZEPAM 2 MG/ML IJ SOLN
0.5000 mg | Freq: Once | INTRAMUSCULAR | Status: AC
  Administered 2013-11-03: 0.5 mg via INTRAVENOUS

## 2013-11-03 MED ORDER — SODIUM CHLORIDE 0.9 % IV SOLN
INTRAVENOUS | Status: DC
  Administered 2013-11-03: 125 mL/h via INTRAVENOUS

## 2013-11-03 MED ORDER — LORAZEPAM 2 MG/ML IJ SOLN
0.5000 mg | Freq: Once | INTRAMUSCULAR | Status: AC
  Administered 2013-11-03: 0.5 mg via INTRAVENOUS
  Filled 2013-11-03: qty 1

## 2013-11-03 MED ORDER — ONDANSETRON HCL 4 MG/2ML IJ SOLN
INTRAMUSCULAR | Status: DC | PRN
  Administered 2013-11-03: 4 mg via INTRAVENOUS

## 2013-11-03 MED ORDER — ONDANSETRON HCL 4 MG/2ML IJ SOLN
INTRAMUSCULAR | Status: AC
  Filled 2013-11-03: qty 2

## 2013-11-03 MED ORDER — NEOSTIGMINE METHYLSULFATE 10 MG/10ML IV SOLN
INTRAVENOUS | Status: AC
  Filled 2013-11-03: qty 1

## 2013-11-03 MED ORDER — VANCOMYCIN HCL IN DEXTROSE 1-5 GM/200ML-% IV SOLN
INTRAVENOUS | Status: AC
  Filled 2013-11-03: qty 200

## 2013-11-03 SURGICAL SUPPLY — 57 items
BAG DECANTER FOR FLEXI CONT (MISCELLANEOUS) ×3 IMPLANT
BENZOIN TINCTURE PRP APPL 2/3 (GAUZE/BANDAGES/DRESSINGS) ×6 IMPLANT
BLADE 10 SAFETY STRL DISP (BLADE) IMPLANT
BLADE SURG ROTATE 9660 (MISCELLANEOUS) ×3 IMPLANT
BLADE ULTRA TIP 2M (BLADE) IMPLANT
BUR MATCHSTICK NEURO 3.0 LAGG (BURR) ×3 IMPLANT
CANISTER SUCT 3000ML (MISCELLANEOUS) ×3 IMPLANT
CLOSURE WOUND 1/2 X4 (GAUZE/BANDAGES/DRESSINGS)
CONT SPEC 4OZ CLIKSEAL STRL BL (MISCELLANEOUS) ×3 IMPLANT
CONT SPEC STER OR (MISCELLANEOUS) ×3 IMPLANT
DECANTER SPIKE VIAL GLASS SM (MISCELLANEOUS) ×3 IMPLANT
DRAPE C-ARM 42X72 X-RAY (DRAPES) IMPLANT
DRAPE LAPAROTOMY 100X72 PEDS (DRAPES) ×3 IMPLANT
DRAPE POUCH INSTRU U-SHP 10X18 (DRAPES) ×3 IMPLANT
DRESSING TELFA 8X3 (GAUZE/BANDAGES/DRESSINGS) IMPLANT
DRSG OPSITE POSTOP 4X10 (GAUZE/BANDAGES/DRESSINGS) ×3 IMPLANT
DRSG TELFA 3X8 NADH (GAUZE/BANDAGES/DRESSINGS) ×3 IMPLANT
DURAPREP 6ML APPLICATOR 50/CS (WOUND CARE) ×3 IMPLANT
ELECT BLADE 4.0 EZ CLEAN MEGAD (MISCELLANEOUS) ×3
ELECT REM PT RETURN 9FT ADLT (ELECTROSURGICAL) ×3
ELECTRODE BLDE 4.0 EZ CLN MEGD (MISCELLANEOUS) ×1 IMPLANT
ELECTRODE REM PT RTRN 9FT ADLT (ELECTROSURGICAL) ×1 IMPLANT
GAUZE SPONGE 4X4 16PLY XRAY LF (GAUZE/BANDAGES/DRESSINGS) IMPLANT
GLOVE BIO SURGEON STRL SZ7 (GLOVE) ×6 IMPLANT
GLOVE ECLIPSE 6.5 STRL STRAW (GLOVE) ×3 IMPLANT
GLOVE EXAM NITRILE LRG STRL (GLOVE) IMPLANT
GLOVE EXAM NITRILE MD LF STRL (GLOVE) IMPLANT
GLOVE EXAM NITRILE XL STR (GLOVE) IMPLANT
GLOVE EXAM NITRILE XS STR PU (GLOVE) IMPLANT
GLOVE INDICATOR 7.5 STRL GRN (GLOVE) ×3 IMPLANT
GOWN STRL REUS W/ TWL LRG LVL3 (GOWN DISPOSABLE) ×2 IMPLANT
GOWN STRL REUS W/ TWL XL LVL3 (GOWN DISPOSABLE) ×1 IMPLANT
GOWN STRL REUS W/TWL 2XL LVL3 (GOWN DISPOSABLE) IMPLANT
GOWN STRL REUS W/TWL LRG LVL3 (GOWN DISPOSABLE) ×4
GOWN STRL REUS W/TWL XL LVL3 (GOWN DISPOSABLE) ×2
KIT BASIN OR (CUSTOM PROCEDURE TRAY) ×3 IMPLANT
KIT ROOM TURNOVER OR (KITS) ×3 IMPLANT
NEEDLE HYPO 25X1 1.5 SAFETY (NEEDLE) ×3 IMPLANT
NS IRRIG 1000ML POUR BTL (IV SOLUTION) ×3 IMPLANT
PACK LAMINECTOMY NEURO (CUSTOM PROCEDURE TRAY) ×3 IMPLANT
PAD ARMBOARD 7.5X6 YLW CONV (MISCELLANEOUS) ×9 IMPLANT
PIN MAYFIELD SKULL DISP (PIN) ×3 IMPLANT
SPONGE GAUZE 4X4 12PLY (GAUZE/BANDAGES/DRESSINGS) IMPLANT
SPONGE LAP 4X18 X RAY DECT (DISPOSABLE) IMPLANT
SPONGE SURGIFOAM ABS GEL 100 (HEMOSTASIS) ×3 IMPLANT
STAPLER SKIN PROX WIDE 3.9 (STAPLE) ×3 IMPLANT
STRIP CLOSURE SKIN 1/2X4 (GAUZE/BANDAGES/DRESSINGS) IMPLANT
SUT ETHILON 3 0 FSL (SUTURE) IMPLANT
SUT VIC AB 0 CT1 18XCR BRD8 (SUTURE) ×4 IMPLANT
SUT VIC AB 0 CT1 8-18 (SUTURE) ×8
SUT VIC AB 2-0 CT1 18 (SUTURE) IMPLANT
SUT VIC AB 3-0 SH 8-18 (SUTURE) ×3 IMPLANT
SYR 20ML ECCENTRIC (SYRINGE) ×3 IMPLANT
TOWEL OR 17X24 6PK STRL BLUE (TOWEL DISPOSABLE) ×3 IMPLANT
TOWEL OR 17X26 10 PK STRL BLUE (TOWEL DISPOSABLE) ×3 IMPLANT
UNDERPAD 30X30 INCONTINENT (UNDERPADS AND DIAPERS) ×3 IMPLANT
WATER STERILE IRR 1000ML POUR (IV SOLUTION) ×3 IMPLANT

## 2013-11-03 NOTE — ED Notes (Addendum)
PT denies tenderness to R side upon palpation. Denies n/v. Reports tingling in R arm at this time. PT also reports urinary frequency and can't make it to the bathroom to urinate. Has had body aches/fever intermittently over the past 2 weeks. Currently diaphoretic

## 2013-11-03 NOTE — Op Note (Signed)
11/03/2013  11:40 PM  PATIENT:  Michael Morton  73 y.o. 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 was taken to the operating room, intubated and placed under a general anesthetic without difficulty. With adequate anesthesia administered I placed a 3 pin Mayfield head holder on his head. He was then rolled prone onto the operating room table and secured to the table with the Mayfield adapter. His head was in some flexion. With all pressure points properly padded we then prepared for surgery.  His back and neck were prepared and draped in a sterile manner. I opened the cervical portion of the incision and exposed the lamina of C6,C5, and C7. I confirmed my location with intraoperative xray.  I performed a laminectomy of C6 and with the removal of the ligamentum flavum pus emanated with some force. I sent cultures of this fluid to the microbiology laboratory. I used blunt tip right angle dissectors to explore rostrally and caudally freeing more pus, especially from the caudal direction. I then opened over what I believed to be the T4 spinous process. I performed a laminectomy and again found purulent material underneath the ligamentum flavum. Once more I used right angle dissectors to break up any loculated areas in the epidural space, and also as I had done at C6 used a pediatric feeding tube to irrigate beyond the  laminectomy sites. From the caudal laminectomy I threaded the feeding tube rostrally and irrigated. The irrigant exited at the C6 laminectomy defect. I satisfied myself after a few rounds of irrigation that I had evacuated the bulk of the abscess. I did not believe in a gentleman of this age that removing all the lamina from C5-T4 was warranted. I achieved hemostasis, then closed the wound. I used vicryl sutures to approximate the thoracolumbar fascia, and subcutaneous planes. I approximated the skin with staples. I applied a honeycomb dressing. We then detached the Mayfied from the bed and rolled the patient supine onto a stretcher.  PLAN OF CARE: Admit to inpatient   PATIENT DISPOSITION:  PACU - hemodynamically stable.   Delay start of Pharmacological VTE agent (>24hrs) due to surgical blood loss or risk of bleeding:  yes

## 2013-11-03 NOTE — Anesthesia Procedure Notes (Signed)
Procedure Name: Intubation Date/Time: 11/03/2013 9:01 PM Performed by: Mosie Epstein Pre-anesthesia Checklist: Patient identified, Timeout performed, Emergency Drugs available, Suction available and Patient being monitored Patient Re-evaluated:Patient Re-evaluated prior to inductionOxygen Delivery Method: Circle system utilized Preoxygenation: Pre-oxygenation with 100% oxygen Intubation Type: IV induction Ventilation: Mask ventilation without difficulty Laryngoscope size: glidescope. Grade View: Grade I Tube size: 7.5 mm Number of attempts: 1 Airway Equipment and Method: Video-laryngoscopy Placement Confirmation: ETT inserted through vocal cords under direct vision,  breath sounds checked- equal and bilateral and positive ETCO2 Secured at: 22 cm Tube secured with: Tape

## 2013-11-03 NOTE — Consult Note (Signed)
Reason for Consult:epidural abcess Referring Physician: Artha, Michael Morton is an 73 y.o. male.  HPI: whom over the last 2 weeks has complained of increasingly severe pain in his shoulders, arms, back, and neck. He has been lethargic, fatigued, and weak. His wife states he has had chills. He has made multiple visits to the ER in the last week. He was found to have a pleural effusion, and a consolidated left lower lobe.The effusion was not tapped, he was given antibiotics to treat an assumed pneumonia. Finally today when he returned to the ED an MRI of the Cspine was ordered which revealed an epidural fluid collection consistent with an epidural abcess.   Past Medical History  Diagnosis Date  . Diabetes mellitus without complication   . Hypertension   . Hypercholesteremia   . Coronary artery disease   . Pneumonia     Past Surgical History  Procedure Laterality Date  . Cardiac surgery    . Back surgery    . Knee surgery    . Rotator cuff repair    . Coronary artery bypass graft    . Appendectomy      No family history on file.  Social History:  reports that he has quit smoking. He does not have any smokeless tobacco history on file. He reports that he drinks alcohol. He reports that he does not use illicit drugs.  Allergies:  Allergies  Allergen Reactions  . Ace Inhibitors Other (See Comments)    Unknown   . Oxycodone Other (See Comments)    Patient states it makes him feel disoriented    Medications: I have reviewed the patient's current medications.  Results for orders placed during the hospital encounter of 11/03/13 (from the past 48 hour(s))  CBC WITH DIFFERENTIAL     Status: Abnormal   Collection Time    11/03/13 12:13 PM      Result Value Ref Range   WBC 9.7  4.0 - 10.5 K/uL   RBC 3.30 (*) 4.22 - 5.81 MIL/uL   Hemoglobin 10.9 (*) 13.0 - 17.0 g/dL   HCT 31.6 (*) 39.0 - 52.0 %   MCV 95.8  78.0 - 100.0 fL   MCH 33.0  26.0 - 34.0 pg   MCHC 34.5  30.0 - 36.0 g/dL    RDW 12.6  11.5 - 15.5 %   Platelets 303  150 - 400 K/uL   Neutrophils Relative % 82 (*) 43 - 77 %   Neutro Abs 8.0 (*) 1.7 - 7.7 K/uL   Lymphocytes Relative 9 (*) 12 - 46 %   Lymphs Abs 0.9  0.7 - 4.0 K/uL   Monocytes Relative 8  3 - 12 %   Monocytes Absolute 0.8  0.1 - 1.0 K/uL   Eosinophils Relative 1  0 - 5 %   Eosinophils Absolute 0.1  0.0 - 0.7 K/uL   Basophils Relative 0  0 - 1 %   Basophils Absolute 0.0  0.0 - 0.1 K/uL  COMPREHENSIVE METABOLIC PANEL     Status: Abnormal   Collection Time    11/03/13 12:13 PM      Result Value Ref Range   Sodium 131 (*) 137 - 147 mEq/L   Potassium 4.9  3.7 - 5.3 mEq/L   Chloride 99  96 - 112 mEq/L   CO2 21  19 - 32 mEq/L   Glucose, Bld 207 (*) 70 - 99 mg/dL   BUN 22  6 - 23 mg/dL  Creatinine, Ser 0.97  0.50 - 1.35 mg/dL   Calcium 9.0  8.4 - 10.5 mg/dL   Total Protein 7.0  6.0 - 8.3 g/dL   Albumin 2.3 (*) 3.5 - 5.2 g/dL   AST 16  0 - 37 U/L   ALT 36  0 - 53 U/L   Alkaline Phosphatase 146 (*) 39 - 117 U/L   Total Bilirubin 0.5  0.3 - 1.2 mg/dL   GFR calc non Af Amer 81 (*) >90 mL/min   GFR calc Af Amer >90  >90 mL/min   Comment: (NOTE)     The eGFR has been calculated using the CKD EPI equation.     This calculation has not been validated in all clinical situations.     eGFR's persistently <90 mL/min signify possible Chronic Kidney     Disease.  SEDIMENTATION RATE     Status: Abnormal   Collection Time    11/03/13 12:13 PM      Result Value Ref Range   Sed Rate 122 (*) 0 - 16 mm/hr    Dg Chest 2 View  11/03/2013   CLINICAL DATA:  Right-sided flank pain for 2 weeks with history of kidney stones  EXAM: CHEST  2 VIEW  COMPARISON:  11/01/2013  FINDINGS: Patient status post prior CABG. Heart size and vascular pattern normal. Right lung is clear. On the left side, there is a small pleural effusion with extensive underlying consolidation in the left lower lobe. This stable when compared to the prior study.  IMPRESSION: Unchanged left  lower lobe effusion and consolidation.   Electronically Signed   By: Skipper Cliche M.D.   On: 11/03/2013 13:10   Dg Chest 2 View  11/02/2013   CLINICAL DATA:  Recent diagnosis of pneumonia 2 days ago, intermittent RIGHT scapular shoulder and arm pain for 2 weeks, RIGHT flank and BILATERAL neck pain, history diabetes, hypertension, coronary artery disease  EXAM: CHEST  2 VIEW  COMPARISON:  10/28/2013  FINDINGS: Normal heart size post CABG.  Mediastinal contours and pulmonary vascularity normal.  New atelectasis and effusion at the LEFT lung base.  Difficult to exclude underlying consolidation in the LEFT lower lobe.  Loculated fluid within LEFT major fissure.  RIGHT lung clear.  No pneumothorax.  Bones unremarkable.  IMPRESSION: New atelectasis and pleural effusion at LEFT lung base, cannot completely exclude underlying consolidation in LEFT lower lobe.  Post CABG.   Electronically Signed   By: Lavonia Dana M.D.   On: 11/02/2013 00:17    Review of Systems  Constitutional: Positive for chills, malaise/fatigue and diaphoresis.  Musculoskeletal: Positive for back pain, myalgias and neck pain.  Neurological: Positive for weakness.   Blood pressure 162/79, pulse 87, temperature 99 F (37.2 C), temperature source Oral, resp. rate 21, SpO2 97.00%. Physical Exam  Constitutional: He appears well-developed and well-nourished. He appears distressed.  Neurological:  Unable to perform  Detailed physical exam due to patient being heavily sedated in order to obtain an MRI. He does move all his extremities. Perrl, symmetric facies Does not arouse to voice.     Assessment/Plan: OR for urgent decompression of the Cervical-thoracic epidural fluid collection. Risks and benefits were discussed with his wife and son in law. Bleeding, infection, need for further surgery, inability to drain fluid, paralysis, increased weakness, and other risks were explained. They understand and wish to proceed based on my  recommendation.   Michael Morton 11/03/2013, 8:01 PM

## 2013-11-03 NOTE — ED Notes (Signed)
Per EMS, pt called b/c he's had R sided pain for ~2wks. PT has cardiac hx w/ 5 stents. PT has had intermittent R shoulder pain, bilateral neck pain. BP en route 164/72, CBG 192. States he feels like he has abdominal distention

## 2013-11-03 NOTE — Transfer of Care (Signed)
Immediate Anesthesia Transfer of Care Note  Patient: Michael Morton  Procedure(s) Performed: Procedure(s): POSTERIOR CERVICAL AND THORACIC EVACUATION OF EPIDURAL ABSCESS, CERVICAL SIX AND THORACIC FOUR LAMINECTOMIES (N/A)  Patient Location: PACU  Anesthesia Type:General  Level of Consciousness: awake, alert  and oriented  Airway & Oxygen Therapy: Patient Spontanous Breathing and Patient connected to face mask oxygen  Post-op Assessment: Report given to PACU RN and Post -op Vital signs reviewed and stable  Post vital signs: Reviewed and stable  Complications: No apparent anesthesia complications

## 2013-11-03 NOTE — ED Provider Notes (Signed)
CSN: [id number]     Arrival date & time 11/03/13  1124 History   First MD Initiated Contact with Patient 11/03/13 1136     Chief Complaint  Patient presents with  . Flank Pain     (Consider location/radiation/quality/duration/timing/severity/associated sxs/prior Treatment) Patient is a 73 y.o. male presenting with flank pain. The history is provided by the patient and the spouse.  Flank Pain   He presents for evaluation of pain in her right arm, right scapula, and right hip for 2 weeks. He also has had generalized myalgia, urinary frequency, urinary incontinence, and a sensation of tingling in the right arm. Additionally, he has periods of chills, and diaphoresis without documented fever. He is to decrease stooling recently. He denies cough, shortness of breath, chest pain, focal weakness, or other areas of paresthesia. The pain is worse with movement, such as trying to lie down or walking. He has had multiple evaluations for this problem and been treated for a community-acquired pneumonia, and with anti-inflammatories and muscle relaxants, all without relief. He is taking his other medications as prescribed. There are no other known modifying factors.  Past Medical History  Diagnosis Date  . Diabetes mellitus without complication   . Hypertension   . Hypercholesteremia   . Coronary artery disease   . Pneumonia    Past Surgical History  Procedure Laterality Date  . Cardiac surgery    . Back surgery    . Knee surgery    . Rotator cuff repair    . Coronary artery bypass graft    . Appendectomy     No family history on file. History  Substance Use Topics  . Smoking status: Former Research scientist (life sciences)  . Smokeless tobacco: Not on file  . Alcohol Use: Yes    Review of Systems  Genitourinary: Positive for flank pain.  All other systems reviewed and are negative.     Allergies  Ace inhibitors and Oxycodone  Home Medications   Prior to Admission medications   Medication Sig Start Date  End Date Taking? Authorizing Provider  acyclovir (ZOVIRAX) 400 MG tablet Take 800 mg by mouth daily.    Yes Historical Provider, MD  amLODipine (NORVASC) 5 MG tablet Take 5 mg by mouth daily.   Yes Historical Provider, MD  amoxicillin-clavulanate (AUGMENTIN) 875-125 MG per tablet Take 1 tablet by mouth 2 (two) times daily. One po bid x 10 days 11/02/13  Yes [name], MD  aspirin 81 MG tablet Take 325 mg by mouth daily.    Yes Historical Provider, MD  etodolac (LODINE) 400 MG tablet Take 400 mg by mouth 2 (two) times daily.   Yes Historical Provider, MD  glyBURIDE (DIABETA) 5 MG tablet Take 5 mg by mouth 2 (two) times daily with a meal.   Yes Historical Provider, MD  HYDROcodone-acetaminophen (NORCO/VICODIN) 5-325 MG per tablet Take 1-2 tablets by mouth every 6 (six) hours as needed. 10/30/13  Yes [name], MD  meloxicam (MOBIC) 7.5 MG tablet Take 1 tablet (7.5 mg total) by mouth daily. 11/02/13  Yes [name], MD  methocarbamol (ROBAXIN) 500 MG tablet Take 1 tablet (500 mg total) by mouth 2 (two) times daily. 11/02/13  Yes [name], MD  metoprolol (LOPRESSOR) 50 MG tablet Take 50 mg by mouth 2 (two) times daily.   Yes Historical Provider, MD  nitroGLYCERIN (NITROSTAT) 0.4 MG SL tablet Place 0.4 mg under the tongue every 5 (five) minutes as needed for chest pain.   Yes Historical  Provider, MD  sertraline (ZOLOFT) 50 MG tablet Take 50 mg by mouth daily.   Yes Historical Provider, MD  simvastatin (ZOCOR) 40 MG tablet Take 40 mg by mouth every evening.   Yes Historical Provider, MD  tamsulosin (FLOMAX) 0.4 MG CAPS capsule Take 1 capsule (0.4 mg total) by mouth daily. 02/12/13  Yes [name], MD  traZODone (DESYREL) 50 MG tablet Take 100 mg by mouth at bedtime.    Yes Historical Provider, MD  venlafaxine (EFFEXOR) 50 MG tablet Take 25 mg by mouth 3 (three) times daily.   Yes Historical Provider, MD   BP 153/73  Pulse 75  Temp(Src) 99 F (37.2 C)  (Oral)  Resp 16  SpO2 95% Physical Exam  Nursing note and vitals reviewed. Constitutional: He is oriented to person, place, and time. He appears well-developed and well-nourished.  HENT:  Head: Normocephalic and atraumatic.  Right Ear: External ear normal.  Left Ear: External ear normal.  Eyes: Conjunctivae and EOM are normal. Pupils are equal, round, and reactive to light.  Neck: Normal range of motion and phonation normal. Neck supple.  Cardiovascular: Normal rate, regular rhythm, normal heart sounds and intact distal pulses.   Pulmonary/Chest: Effort normal and breath sounds normal. No respiratory distress. He has no wheezes. He exhibits no tenderness and no bony tenderness.  Abdominal: Soft. He exhibits no mass. There is no tenderness. There is no guarding.  Musculoskeletal: Normal range of motion.  No significant pain with passive range of motion of the arms or legs, bilaterally. No palpable tenderness of the cervical, thoracic, or lumbar spinous processes. No deformity of the posterior chest wall.  Neurological: He is alert and oriented to person, place, and time. No cranial nerve deficit or sensory deficit. He exhibits normal muscle tone. Coordination normal.  Absent DTR right elbow, and right knee.  Skin: Skin is warm and intact. No rash noted. No erythema.  He is diaphoretic  Psychiatric: He has a normal mood and affect. His behavior is normal. Judgment and thought content normal.    ED Course  Procedures (including critical care time)  12:30-Pain without reproduction on exam, and his multiple symptoms, are most consistent with an acute spinal myelopathy. This is an elusive diagnosis and will require advance imaging. Will repeat chest x-ray and get screening blood work including cultures pending further evaluation here, today.  17:43- I was contacted by Radiology, who states that the patient has a cervical and thoracic epidural process, not completely differentiated, yet because  of incomplete scanning. Patient will require additional sedation to complete scanning images. I have placed a consult request to neurosurgery, to obtain assistance with management of this case.  18:00- Dr. Christella Noa, Neurosurgery, will see the patient.  7:47 PM- he has returned from radiology with MRIs, results indicating a cervical and thoracic epidural abscess. Dr. Christella Noa will take him to the OR for a "washout". He required multiple doses of Ativan and fentanyl to complete the imaging. The patient is sedated now, but remains alert and even joking. He still is in moderate pain.   CRITICAL CARE Performed by: Richarda Blade Total critical care time: 90 min Critical care time was exclusive of separately billable procedures and treating other patients. Critical care was necessary to treat or prevent imminent or life-threatening deterioration. Critical care was time spent personally by me on the following activities: development of treatment plan with patient and/or surrogate as well as nursing, discussions with consultants, evaluation of patient's response to treatment, examination of  patient, obtaining history from patient or surrogate, ordering and performing treatments and interventions, ordering and review of laboratory studies, ordering and review of radiographic studies, pulse oximetry and re-evaluation of patient's condition.  Labs Review Labs Reviewed  CBC WITH DIFFERENTIAL - Abnormal; Notable for the following:    RBC 3.30 (*)    Hemoglobin 10.9 (*)    HCT 31.6 (*)    Neutrophils Relative % 82 (*)    Neutro Abs 8.0 (*)    Lymphocytes Relative 9 (*)    All other components within normal limits  COMPREHENSIVE METABOLIC PANEL - Abnormal; Notable for the following:    Sodium 131 (*)    Glucose, Bld 207 (*)    Albumin 2.3 (*)    Alkaline Phosphatase 146 (*)    GFR calc non Af Amer 81 (*)    All other components within normal limits  SEDIMENTATION RATE - Abnormal; Notable for the  following:    Sed Rate 122 (*)    All other components within normal limits  CULTURE, BLOOD (ROUTINE X 2)  CULTURE, BLOOD (ROUTINE X 2)    Imaging Review Dg Chest 2 View  11/03/2013   CLINICAL DATA:  Right-sided flank pain for 2 weeks with history of kidney stones  EXAM: CHEST  2 VIEW  COMPARISON:  11/01/2013  FINDINGS: Patient status post prior CABG. Heart size and vascular pattern normal. Right lung is clear. On the left side, there is a small pleural effusion with extensive underlying consolidation in the left lower lobe. This stable when compared to the prior study.  IMPRESSION: Unchanged left lower lobe effusion and consolidation.   Electronically Signed   By: Skipper Cliche M.D.   On: 11/03/2013 13:10   Dg Chest 2 View  11/02/2013   CLINICAL DATA:  Recent diagnosis of pneumonia 2 days ago, intermittent RIGHT scapular shoulder and arm pain for 2 weeks, RIGHT flank and BILATERAL neck pain, history diabetes, hypertension, coronary artery disease  EXAM: CHEST  2 VIEW  COMPARISON:  10/28/2013  FINDINGS: Normal heart size post CABG.  Mediastinal contours and pulmonary vascularity normal.  New atelectasis and effusion at the LEFT lung base.  Difficult to exclude underlying consolidation in the LEFT lower lobe.  Loculated fluid within LEFT major fissure.  RIGHT lung clear.  No pneumothorax.  Bones unremarkable.  IMPRESSION: New atelectasis and pleural effusion at LEFT lung base, cannot completely exclude underlying consolidation in LEFT lower lobe.  Post CABG.   Electronically Signed   By: Lavonia Dana M.D.   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

## 2013-11-03 NOTE — ED Notes (Signed)
MD at bedside. 

## 2013-11-03 NOTE — ED Notes (Signed)
MRI called b/c PT was not able to tolerate lying still d/t arm pain. Obtained order for meds and delivered/administrated medications to PT @ MRI.

## 2013-11-03 NOTE — ED Notes (Signed)
Dr Eulis Foster reported that PT in MRI unable to remain still for critical imaging. Ordered to adm 0.25mg  ativan q3-57min up to 2mg  and 21mcg fentanyl q3-78min PRN. Called charge RN to let her know i'd be off the floor for the duration of the MRI and asked Terrance, RN to cover my PT's charge will help if Celesta Gentile needs it-I let him know this.

## 2013-11-03 NOTE — Anesthesia Preprocedure Evaluation (Addendum)
Anesthesia Evaluation  Patient identified by MRN, date of birth, ID band Patient awake    Reviewed: Allergy & Precautions, H&P , NPO status , Patient's Chart, lab work & pertinent test results, reviewed documented beta blocker date and time   History of Anesthesia Complications Negative for: history of anesthetic complications  Airway Mallampati: II TM Distance: >3 FB Neck ROM: Full    Dental no notable dental hx. (+) Teeth Intact, Dental Advisory Given Implants upper bridge - not removeable:   Pulmonary neg pulmonary ROS, former smoker,    Pulmonary exam normal       Cardiovascular hypertension, On Medications, On Home Beta Blockers and Pt. on home beta blockers + CAD     Neuro/Psych negative neurological ROS  negative psych ROS   GI/Hepatic negative GI ROS, Neg liver ROS,   Endo/Other  diabetes, Type 2, Oral Hypoglycemic Agents  Renal/GU negative Renal ROS  negative genitourinary   Musculoskeletal  (+) Arthritis -, Osteoarthritis,    Abdominal   Peds  Hematology negative hematology ROS (+)   Anesthesia Other Findings   Reproductive/Obstetrics negative OB ROS                        Anesthesia Physical Anesthesia Plan  ASA: III and emergent  Anesthesia Plan: General   Post-op Pain Management:    Induction: Intravenous  Airway Management Planned: Oral ETT  Additional Equipment:   Intra-op Plan:   Post-operative Plan: Extubation in OR  Informed Consent: I have reviewed the patients History and Physical, chart, labs and discussed the procedure including the risks, benefits and alternatives for the proposed anesthesia with the patient or authorized representative who has indicated his/her understanding and acceptance.   Dental advisory given  Plan Discussed with: CRNA, Anesthesiologist and Surgeon  Anesthesia Plan Comments:        Anesthesia Quick Evaluation

## 2013-11-03 NOTE — ED Notes (Signed)
Removed PT's wedding band and gave it to his wife in [facility] waiting area. Michael Morton witnessed exchange

## 2013-11-04 DIAGNOSIS — E1169 Type 2 diabetes mellitus with other specified complication: Secondary | ICD-10-CM | POA: Diagnosis present

## 2013-11-04 DIAGNOSIS — J189 Pneumonia, unspecified organism: Secondary | ICD-10-CM | POA: Diagnosis present

## 2013-11-04 DIAGNOSIS — Z7982 Long term (current) use of aspirin: Secondary | ICD-10-CM | POA: Diagnosis not present

## 2013-11-04 DIAGNOSIS — F329 Major depressive disorder, single episode, unspecified: Secondary | ICD-10-CM | POA: Diagnosis present

## 2013-11-04 DIAGNOSIS — Z79899 Other long term (current) drug therapy: Secondary | ICD-10-CM | POA: Diagnosis not present

## 2013-11-04 DIAGNOSIS — Z951 Presence of aortocoronary bypass graft: Secondary | ICD-10-CM | POA: Diagnosis not present

## 2013-11-04 DIAGNOSIS — D649 Anemia, unspecified: Secondary | ICD-10-CM | POA: Diagnosis present

## 2013-11-04 DIAGNOSIS — N200 Calculus of kidney: Secondary | ICD-10-CM | POA: Diagnosis present

## 2013-11-04 DIAGNOSIS — A4901 Methicillin susceptible Staphylococcus aureus infection, unspecified site: Secondary | ICD-10-CM | POA: Diagnosis present

## 2013-11-04 DIAGNOSIS — F3289 Other specified depressive episodes: Secondary | ICD-10-CM | POA: Diagnosis present

## 2013-11-04 DIAGNOSIS — L97509 Non-pressure chronic ulcer of other part of unspecified foot with unspecified severity: Secondary | ICD-10-CM | POA: Diagnosis present

## 2013-11-04 DIAGNOSIS — E785 Hyperlipidemia, unspecified: Secondary | ICD-10-CM | POA: Diagnosis present

## 2013-11-04 DIAGNOSIS — I10 Essential (primary) hypertension: Secondary | ICD-10-CM | POA: Diagnosis present

## 2013-11-04 DIAGNOSIS — N401 Enlarged prostate with lower urinary tract symptoms: Secondary | ICD-10-CM | POA: Diagnosis present

## 2013-11-04 DIAGNOSIS — G061 Intraspinal abscess and granuloma: Secondary | ICD-10-CM | POA: Diagnosis present

## 2013-11-04 DIAGNOSIS — J90 Pleural effusion, not elsewhere classified: Secondary | ICD-10-CM | POA: Diagnosis present

## 2013-11-04 DIAGNOSIS — J9819 Other pulmonary collapse: Secondary | ICD-10-CM | POA: Diagnosis present

## 2013-11-04 DIAGNOSIS — Z87891 Personal history of nicotine dependence: Secondary | ICD-10-CM | POA: Diagnosis not present

## 2013-11-04 DIAGNOSIS — R209 Unspecified disturbances of skin sensation: Secondary | ICD-10-CM | POA: Diagnosis present

## 2013-11-04 DIAGNOSIS — I2510 Atherosclerotic heart disease of native coronary artery without angina pectoris: Secondary | ICD-10-CM | POA: Diagnosis present

## 2013-11-04 DIAGNOSIS — N138 Other obstructive and reflux uropathy: Secondary | ICD-10-CM | POA: Diagnosis present

## 2013-11-04 DIAGNOSIS — G4733 Obstructive sleep apnea (adult) (pediatric): Secondary | ICD-10-CM | POA: Diagnosis present

## 2013-11-04 DIAGNOSIS — A419 Sepsis, unspecified organism: Secondary | ICD-10-CM | POA: Diagnosis present

## 2013-11-04 DIAGNOSIS — J449 Chronic obstructive pulmonary disease, unspecified: Secondary | ICD-10-CM

## 2013-11-04 DIAGNOSIS — E871 Hypo-osmolality and hyponatremia: Secondary | ICD-10-CM | POA: Diagnosis present

## 2013-11-04 LAB — CBC
HCT: 31 % — ABNORMAL LOW (ref 39.0–52.0)
Hemoglobin: 10.5 g/dL — ABNORMAL LOW (ref 13.0–17.0)
MCH: 32.8 pg (ref 26.0–34.0)
MCHC: 33.9 g/dL (ref 30.0–36.0)
MCV: 96.9 fL (ref 78.0–100.0)
PLATELETS: 310 10*3/uL (ref 150–400)
RBC: 3.2 MIL/uL — ABNORMAL LOW (ref 4.22–5.81)
RDW: 12.7 % (ref 11.5–15.5)
WBC: 14.9 10*3/uL — AB (ref 4.0–10.5)

## 2013-11-04 LAB — BLOOD GAS, ARTERIAL
Acid-base deficit: 3.8 mmol/L — ABNORMAL HIGH (ref 0.0–2.0)
BICARBONATE: 21.2 meq/L (ref 20.0–24.0)
Delivery systems: POSITIVE
Drawn by: 23604
EXPIRATORY PAP: 15
Inspiratory PAP: 15
O2 CONTENT: 10 L/min
O2 SAT: 93.5 %
PATIENT TEMPERATURE: 98.6
TCO2: 22.4 mmol/L (ref 0–100)
pCO2 arterial: 41.3 mmHg (ref 35.0–45.0)
pH, Arterial: 7.33 — ABNORMAL LOW (ref 7.350–7.450)
pO2, Arterial: 87 mmHg (ref 80.0–100.0)

## 2013-11-04 LAB — BASIC METABOLIC PANEL
BUN: 18 mg/dL (ref 6–23)
CALCIUM: 8.6 mg/dL (ref 8.4–10.5)
CO2: 22 mEq/L (ref 19–32)
CREATININE: 0.97 mg/dL (ref 0.50–1.35)
Chloride: 97 mEq/L (ref 96–112)
GFR calc Af Amer: >90 mL/min (ref >90)
GFR calc non Af Amer: 81 mL/min — ABNORMAL LOW (ref >90)
Glucose, Bld: 270 mg/dL — ABNORMAL HIGH (ref 70–99)
Potassium: 5.1 mEq/L (ref 3.7–5.3)
Sodium: 133 mEq/L — ABNORMAL LOW (ref 137–147)

## 2013-11-04 LAB — GLUCOSE, CAPILLARY
Glucose-Capillary: 259 mg/dL — ABNORMAL HIGH (ref 70–99)
Glucose-Capillary: 182 mg/dL — ABNORMAL HIGH (ref 70–99)
Glucose-Capillary: 164 mg/dL — ABNORMAL HIGH (ref 70–99)
Glucose-Capillary: 187 mg/dL — ABNORMAL HIGH (ref 70–99)
Glucose-Capillary: 148 mg/dL — ABNORMAL HIGH (ref 70–99)

## 2013-11-04 LAB — GRAM STAIN

## 2013-11-04 LAB — HEMOGLOBIN A1C
Hgb A1c MFr Bld: 7.1 % — ABNORMAL HIGH (ref <5.7)
Mean Plasma Glucose: 157 mg/dL — ABNORMAL HIGH (ref <117)

## 2013-11-04 LAB — MRSA PCR SCREENING: MRSA by PCR: NEGATIVE

## 2013-11-04 MED ORDER — ONDANSETRON HCL 4 MG/2ML IJ SOLN
4.0000 mg | INTRAMUSCULAR | Status: DC | PRN
  Administered 2013-11-07: 4 mg via INTRAVENOUS
  Filled 2013-11-04: qty 2

## 2013-11-04 MED ORDER — POTASSIUM CHLORIDE IN NACL 20-0.9 MEQ/L-% IV SOLN
INTRAVENOUS | Status: DC
  Administered 2013-11-04 (×2): via INTRAVENOUS
  Filled 2013-11-04 (×4): qty 1000

## 2013-11-04 MED ORDER — GLYBURIDE 5 MG PO TABS
5.0000 mg | ORAL_TABLET | Freq: Two times a day (BID) | ORAL | Status: DC
  Administered 2013-11-04 – 2013-11-13 (×18): 5 mg via ORAL
  Filled 2013-11-04 (×21): qty 1

## 2013-11-04 MED ORDER — TAMSULOSIN HCL 0.4 MG PO CAPS
0.4000 mg | ORAL_CAPSULE | Freq: Every day | ORAL | Status: DC
  Administered 2013-11-04 – 2013-11-13 (×10): 0.4 mg via ORAL
  Filled 2013-11-04 (×10): qty 1

## 2013-11-04 MED ORDER — SODIUM CHLORIDE 0.9 % IJ SOLN: 3.0000 mL | INTRAMUSCULAR | Status: DC | PRN

## 2013-11-04 MED ORDER — INSULIN ASPART 100 UNIT/ML ~~LOC~~ SOLN
0.0000 [IU] | Freq: Three times a day (TID) | SUBCUTANEOUS | Status: DC
Start: 2013-11-04 — End: 2013-11-04

## 2013-11-04 MED ORDER — ACYCLOVIR 800 MG PO TABS
800.0000 mg | ORAL_TABLET | Freq: Every day | ORAL | Status: DC
  Administered 2013-11-04 – 2013-11-08 (×5): 800 mg via ORAL
  Filled 2013-11-04 (×5): qty 1

## 2013-11-04 MED ORDER — FAMOTIDINE 20 MG PO TABS
20.0000 mg | ORAL_TABLET | Freq: Two times a day (BID) | ORAL | Status: DC
  Administered 2013-11-04 – 2013-11-05 (×3): 20 mg via ORAL
  Filled 2013-11-04 (×4): qty 1

## 2013-11-04 MED ORDER — NALOXONE HCL 0.4 MG/ML IJ SOLN
INTRAMUSCULAR | Status: AC
  Filled 2013-11-04: qty 1

## 2013-11-04 MED ORDER — CHLORHEXIDINE GLUCONATE 0.12 % MT SOLN
15.0000 mL | Freq: Two times a day (BID) | OROMUCOSAL | Status: DC
  Administered 2013-11-04: 15 mL via OROMUCOSAL
  Filled 2013-11-04: qty 15

## 2013-11-04 MED ORDER — SIMVASTATIN 40 MG PO TABS
40.0000 mg | ORAL_TABLET | Freq: Every evening | ORAL | Status: DC
  Filled 2013-11-04: qty 1

## 2013-11-04 MED ORDER — ATORVASTATIN CALCIUM 20 MG PO TABS
20.0000 mg | ORAL_TABLET | Freq: Every day | ORAL | Status: DC
  Administered 2013-11-04 – 2013-11-12 (×9): 20 mg via ORAL
  Filled 2013-11-04 (×10): qty 1

## 2013-11-04 MED ORDER — ACETAMINOPHEN 650 MG RE SUPP: 650.0000 mg | RECTAL | Status: DC | PRN

## 2013-11-04 MED ORDER — INSULIN ASPART 100 UNIT/ML ~~LOC~~ SOLN
2.0000 [IU] | SUBCUTANEOUS | Status: DC
  Administered 2013-11-04: 2 [IU] via SUBCUTANEOUS
  Administered 2013-11-04 (×3): 4 [IU] via SUBCUTANEOUS
  Administered 2013-11-04: 6 [IU] via SUBCUTANEOUS
  Administered 2013-11-05 (×2): 2 [IU] via SUBCUTANEOUS

## 2013-11-04 MED ORDER — ALUM & MAG HYDROXIDE-SIMETH 200-200-20 MG/5ML PO SUSP: 15.0000 mL | ORAL | Status: DC | PRN

## 2013-11-04 MED ORDER — TRAZODONE HCL 100 MG PO TABS
100.0000 mg | ORAL_TABLET | Freq: Every day | ORAL | Status: DC
  Administered 2013-11-04 – 2013-11-12 (×9): 100 mg via ORAL
  Filled 2013-11-04 (×10): qty 1

## 2013-11-04 MED ORDER — NITROGLYCERIN 0.4 MG SL SUBL: 0.4000 mg | SUBLINGUAL_TABLET | SUBLINGUAL | Status: DC | PRN

## 2013-11-04 MED ORDER — PHENOL 1.4 % MT LIQD: 1.0000 | OROMUCOSAL | Status: DC | PRN

## 2013-11-04 MED ORDER — POLYETHYLENE GLYCOL 3350 17 G PO PACK
17.0000 g | PACK | Freq: Every day | ORAL | Status: DC | PRN
  Administered 2013-11-05 – 2013-11-06 (×2): 17 g via ORAL
  Filled 2013-11-04 (×2): qty 1

## 2013-11-04 MED ORDER — AMLODIPINE BESYLATE 5 MG PO TABS
5.0000 mg | ORAL_TABLET | Freq: Every day | ORAL | Status: DC
  Administered 2013-11-04 – 2013-11-13 (×10): 5 mg via ORAL
  Filled 2013-11-04 (×10): qty 1

## 2013-11-04 MED ORDER — BIOTENE DRY MOUTH MT LIQD
15.0000 mL | Freq: Two times a day (BID) | OROMUCOSAL | Status: DC
  Administered 2013-11-04: 15 mL via OROMUCOSAL

## 2013-11-04 MED ORDER — SENNA 8.6 MG PO TABS
1.0000 | ORAL_TABLET | Freq: Two times a day (BID) | ORAL | Status: DC
  Administered 2013-11-04 – 2013-11-13 (×19): 8.6 mg via ORAL
  Filled 2013-11-04 (×20): qty 1

## 2013-11-04 MED ORDER — SODIUM CHLORIDE 0.9 % IV SOLN: 250.0000 mL | INTRAVENOUS | Status: DC

## 2013-11-04 MED ORDER — SERTRALINE HCL 50 MG PO TABS
50.0000 mg | ORAL_TABLET | Freq: Every day | ORAL | Status: DC
  Administered 2013-11-04 – 2013-11-13 (×10): 50 mg via ORAL
  Filled 2013-11-04 (×10): qty 1

## 2013-11-04 MED ORDER — MORPHINE SULFATE 2 MG/ML IJ SOLN: 1.0000 mg | INTRAMUSCULAR | Status: DC | PRN

## 2013-11-04 MED ORDER — INSULIN ASPART 100 UNIT/ML ~~LOC~~ SOLN: 0.0000 [IU] | Freq: Every day | SUBCUTANEOUS | Status: DC

## 2013-11-04 MED ORDER — ASPIRIN EC 81 MG PO TBEC
81.0000 mg | DELAYED_RELEASE_TABLET | Freq: Every day | ORAL | Status: DC
  Administered 2013-11-04 – 2013-11-13 (×10): 81 mg via ORAL
  Filled 2013-11-04 (×10): qty 1

## 2013-11-04 MED ORDER — METOPROLOL TARTRATE 50 MG PO TABS
50.0000 mg | ORAL_TABLET | Freq: Two times a day (BID) | ORAL | Status: DC
  Administered 2013-11-04 – 2013-11-13 (×19): 50 mg via ORAL
  Filled 2013-11-04 (×20): qty 1

## 2013-11-04 MED ORDER — MENTHOL 3 MG MT LOZG: 1.0000 | LOZENGE | OROMUCOSAL | Status: DC | PRN

## 2013-11-04 MED ORDER — VENLAFAXINE HCL 25 MG PO TABS
25.0000 mg | ORAL_TABLET | Freq: Three times a day (TID) | ORAL | Status: DC
  Administered 2013-11-04 – 2013-11-13 (×28): 25 mg via ORAL
  Filled 2013-11-04 (×30): qty 1

## 2013-11-04 MED ORDER — DIAZEPAM 5 MG PO TABS
5.0000 mg | ORAL_TABLET | Freq: Four times a day (QID) | ORAL | Status: DC | PRN
  Administered 2013-11-05 – 2013-11-11 (×8): 5 mg via ORAL
  Filled 2013-11-04 (×9): qty 1

## 2013-11-04 MED ORDER — HYDROCODONE-ACETAMINOPHEN 5-325 MG PO TABS
1.0000 | ORAL_TABLET | ORAL | Status: DC | PRN
  Administered 2013-11-05 – 2013-11-13 (×16): 2 via ORAL
  Filled 2013-11-04 (×17): qty 2

## 2013-11-04 MED ORDER — ACETAMINOPHEN 325 MG PO TABS
650.0000 mg | ORAL_TABLET | ORAL | Status: DC | PRN
  Administered 2013-11-05: 650 mg via ORAL
  Filled 2013-11-04: qty 2

## 2013-11-04 MED ORDER — DEXTROSE 5 % IV SOLN
2.0000 g | Freq: Three times a day (TID) | INTRAVENOUS | Status: DC
  Administered 2013-11-04 – 2013-11-06 (×8): 2 g via INTRAVENOUS
  Filled 2013-11-04 (×11): qty 2

## 2013-11-04 MED ORDER — SODIUM CHLORIDE 0.9 % IJ SOLN
3.0000 mL | Freq: Two times a day (BID) | INTRAMUSCULAR | Status: DC
  Administered 2013-11-04 – 2013-11-10 (×6): 3 mL via INTRAVENOUS

## 2013-11-04 MED ORDER — SODIUM CHLORIDE 0.9 % IV SOLN
INTRAVENOUS | Status: DC
  Administered 2013-11-05: 11:00:00 via INTRAVENOUS

## 2013-11-04 MED ORDER — VANCOMYCIN HCL 10 G IV SOLR
1250.0000 mg | Freq: Two times a day (BID) | INTRAVENOUS | Status: DC
  Administered 2013-11-04 – 2013-11-06 (×5): 1250 mg via INTRAVENOUS
  Filled 2013-11-04 (×7): qty 1250

## 2013-11-04 MED ORDER — METRONIDAZOLE IN NACL 5-0.79 MG/ML-% IV SOLN
500.0000 mg | Freq: Three times a day (TID) | INTRAVENOUS | Status: DC
  Administered 2013-11-04 – 2013-11-06 (×8): 500 mg via INTRAVENOUS
  Filled 2013-11-04 (×11): qty 100

## 2013-11-04 NOTE — Progress Notes (Signed)
Patient ID: Michael Morton, male   DOB: Sep 11, 1940, 73 y.o.   MRN: [id number] BP 144/57  Pulse 96  Temp(Src) 98.6 F (37 C) (Oral)  Resp 28  Ht 6\' 2"  (1.88 m)  Wt 90.3 kg (199 lb 1.2 oz)  BMI 25.55 kg/m2  SpO2 99% Alert and oriented x 4, speech is clear, fluent Moving all extremities well Dressing intact Has less pain in neck.  Appreciate critical care help.

## 2013-11-04 NOTE — Evaluation (Signed)
Physical Therapy Evaluation Patient Details Name: Michael Morton MRN: [id number] DOB: 1940-08-08 Today's Date: 11/04/2013   History of Present Illness  Pt is a 73 yo male whom over the last 2 weeks has complained of increasingly severe pain in his shoulders, arms, back, and neck. He has been lethargic, fatigued, and weak. He has made multiple visits to the ER in the last week. He was found to have a pleural effusion, and a consolidated left lower lobe.The effusion was not tapped, he was given antibiotics to treat an assumed pneumonia. Finally today when he returned to the ED an MRI of the Cspine was ordered which revealed an epidural fluid collection consistent with an epidural abcess. On 5/30 pt s/p POSTERIOR CERVICAL AND THORACIC EVACUATION OF EPIDURAL ABSCESS, C6 AND T4 LAMINECTOMIES.  Clinical Impression  Pt tolerated OOB mobility well for first time up since surgery late last night. Pt is presenting with depressed mood but pt aware and reports "I'll be better tomorrow." Suspect pt to progress well and be able to d/c home with assist of spouse. Acute PT to progress ambulation and stair negotiation.    Follow Up Recommendations No PT follow up;Supervision/Assistance - 24 hour    Equipment Recommendations  None recommended by PT    Recommendations for Other Services       Precautions / Restrictions Precautions Precautions: Cervical Precaution Comments: did not give handout due to patients depressed mood and report "i'm sorry i'm no good today. I'll be better tomorrow." will plan on giving pt handout tomorrow Restrictions Weight Bearing Restrictions: No      Mobility  Bed Mobility Overal bed mobility: Needs Assistance Bed Mobility: Rolling;Sidelying to Sit Rolling: Modified independent (Device/Increase time) (with use of bed rail) Sidelying to sit: Mod assist       General bed mobility comments: assist for trunk elevation from sidelying, pt indep with LE  management  Transfers Overall transfer level: Needs assistance Equipment used: 1 person hand held assist Transfers: Sit to/from Stand;Stand Pivot Transfers Sit to Stand: Min assist;+2 safety/equipment (RN present to assist but could do it with 1 person) Stand pivot transfers: Min assist;+2 safety/equipment       General transfer comment: pt with safe technique, bilat HHA but could've done it with 1 person assist. pt able to take 5 steps to chair without difficulty  Ambulation/Gait                Stairs            Wheelchair Mobility    Modified Rankin (Stroke Patients Only)       Balance Overall balance assessment: Needs assistance Sitting-balance support: Feet supported Sitting balance-Leahy Scale: Fair     Standing balance support: Single extremity supported;During functional activity Standing balance-Leahy Scale: Fair                               Pertinent Vitals/Pain 8/10 surgical pain    Home Living Family/patient expects to be discharged to:: Private residence Living Arrangements: Spouse/significant other Available Help at Discharge: Family;Available 24 hours/day Type of Home: House Home Access: Stairs to enter   [employer] of Steps: 1 Home Layout: One level Home Equipment: None      Prior Function Level of Independence: Independent               Hand Dominance   Dominant Hand: Right    Extremity/Trunk Assessment   Upper Extremity Assessment: Overall  WFL for tasks assessed (denies numbness and tingling)           Lower Extremity Assessment: Overall WFL for tasks assessed      Cervical / Trunk Assessment: Kyphotic (recent cervical/thoracic surgery)  Communication   Communication: No difficulties  Cognition Arousal/Alertness: Awake/alert Behavior During Therapy: Flat affect (depressed mood) Overall Cognitive Status: Within Functional Limits for tasks assessed                       General Comments      Exercises        Assessment/Plan    PT Assessment Patient needs continued PT services  PT Diagnosis Acute pain;Difficulty walking   PT Problem List Decreased activity tolerance;Decreased balance;Decreased mobility  PT Treatment Interventions DME instruction;Stair training;Gait training;Functional mobility training;Therapeutic activities;Therapeutic exercise;Balance training   PT Goals (Current goals can be found in the Care Plan section) Acute Rehab PT Goals Patient Stated Goal: home PT Goal Formulation: With patient Time For Goal Achievement: 11/11/13 Potential to Achieve Goals: Good    Frequency Min 5X/week   Barriers to discharge        Co-evaluation               End of Session Equipment Utilized During Treatment: Gait belt Activity Tolerance: Patient tolerated treatment well Patient left: in chair;with call bell/phone within reach;with nursing/sitter in room Nurse Communication: Mobility status         Time: 9163-8466 PT Time Calculation (min): 20 min   Charges:   PT Evaluation $Initial PT Evaluation Tier I: 1 Procedure PT Treatments $Therapeutic Activity: 8-22 mins   PT G CodesBerline Lopes 11/04/2013, 3:39 PM  Kittie Plater, PT, DPT Pager #: [phone] Office #: [phone]

## 2013-11-04 NOTE — Anesthesia Postprocedure Evaluation (Signed)
  Anesthesia Post-op Note  Patient: Michael Morton  Procedure(s) Performed: Procedure(s): POSTERIOR CERVICAL AND THORACIC EVACUATION OF EPIDURAL ABSCESS, CERVICAL SIX AND THORACIC FOUR LAMINECTOMIES (N/A)  Patient Location: PACU  Anesthesia Type:General  Level of Consciousness: sedated and responds to stimulation  Airway and Oxygen Therapy: Patient Spontanous Breathing and placed on CPAP  Post-op Pain: none  Post-op Assessment: Post-op Vital signs reviewed, Patient's Cardiovascular Status Stable, Respiratory Function Stable and Pain level controlled  Post-op Vital Signs: Reviewed and stable  Last Vitals:  Filed Vitals:   11/04/13 0130  BP:   Pulse: 102  Temp:   Resp: 14    Complications: No apparent anesthesia complications

## 2013-11-04 NOTE — H&P (Signed)
Referring Physician: Eyoel, Michael Morton is an 73 y.o. male.  HPI: whom over the last 2 weeks has complained of increasingly severe pain in his shoulders, arms, back, and neck. He has been lethargic, fatigued, and weak. His wife states he has had chills. He has made multiple visits to the ER in the last week. He was found to have a pleural effusion, and a consolidated left lower lobe.The effusion was not tapped, he was given antibiotics to treat an assumed pneumonia. Finally today when he returned to the ED an MRI of the Cspine was ordered which revealed an epidural fluid collection consistent with an epidural abcess.  Past Medical History   Diagnosis  Date   .  Diabetes mellitus without complication    .  Hypertension    .  Hypercholesteremia    .  Coronary artery disease    .  Pneumonia     Past Surgical History   Procedure  Laterality  Date   .  Cardiac surgery     .  Back surgery     .  Knee surgery     .  Rotator cuff repair     .  Coronary artery bypass graft     .  Appendectomy      No family history on file.  Social History: reports that he has quit smoking. He does not have any smokeless tobacco history on file. He reports that he drinks alcohol. He reports that he does not use illicit drugs.  Allergies:  Allergies   Allergen  Reactions   .  Ace Inhibitors  Other (See Comments)     Unknown   .  Oxycodone  Other (See Comments)     Patient states it makes him feel disoriented    Medications: I have reviewed the patient's current medications.  Results for orders placed during the hospital encounter of 11/03/13 (from the past 48 hour(s))   CBC WITH DIFFERENTIAL Status: Abnormal    Collection Time    11/03/13 12:13 PM   Result  Value  Ref Range    WBC  9.7  4.0 - 10.5 K/uL    RBC  3.30 (*)  4.22 - 5.81 MIL/uL    Hemoglobin  10.9 (*)  13.0 - 17.0 g/dL    HCT  31.6 (*)  39.0 - 52.0 %    MCV  95.8  78.0 - 100.0 fL    MCH  33.0  26.0 - 34.0 pg    MCHC  34.5  30.0 - 36.0  g/dL    RDW  12.6  11.5 - 15.5 %    Platelets  303  150 - 400 K/uL    Neutrophils Relative %  82 (*)  43 - 77 %    Neutro Abs  8.0 (*)  1.7 - 7.7 K/uL    Lymphocytes Relative  9 (*)  12 - 46 %    Lymphs Abs  0.9  0.7 - 4.0 K/uL    Monocytes Relative  8  3 - 12 %    Monocytes Absolute  0.8  0.1 - 1.0 K/uL    Eosinophils Relative  1  0 - 5 %    Eosinophils Absolute  0.1  0.0 - 0.7 K/uL    Basophils Relative  0  0 - 1 %    Basophils Absolute  0.0  0.0 - 0.1 K/uL   COMPREHENSIVE METABOLIC PANEL Status: Abnormal    Collection Time  11/03/13 12:13 PM   Result  Value  Ref Range    Sodium  131 (*)  137 - 147 mEq/L    Potassium  4.9  3.7 - 5.3 mEq/L    Chloride  99  96 - 112 mEq/L    CO2  21  19 - 32 mEq/L    Glucose, Bld  207 (*)  70 - 99 mg/dL    BUN  22  6 - 23 mg/dL    Creatinine, Ser  0.97  0.50 - 1.35 mg/dL    Calcium  9.0  8.4 - 10.5 mg/dL    Total Protein  7.0  6.0 - 8.3 g/dL    Albumin  2.3 (*)  3.5 - 5.2 g/dL    AST  16  0 - 37 U/L    ALT  36  0 - 53 U/L    Alkaline Phosphatase  146 (*)  39 - 117 U/L    Total Bilirubin  0.5  0.3 - 1.2 mg/dL    GFR calc non Af Amer  81 (*)  >90 mL/min    GFR calc Af Amer  >90  >90 mL/min    Comment:  (NOTE)     The eGFR has been calculated using the CKD EPI equation.     This calculation has not been validated in all clinical situations.     eGFR's persistently <90 mL/min signify possible Chronic Kidney     Disease.   SEDIMENTATION RATE Status: Abnormal    Collection Time    11/03/13 12:13 PM   Result  Value  Ref Range    Sed Rate  122 (*)  0 - 16 mm/hr    Dg Chest 2 View  11/03/2013 CLINICAL DATA: Right-sided flank pain for 2 weeks with history of kidney stones EXAM: CHEST 2 VIEW COMPARISON: 11/01/2013 FINDINGS: Patient status post prior CABG. Heart size and vascular pattern normal. Right lung is clear. On the left side, there is a small pleural effusion with extensive underlying consolidation in the left lower lobe. This stable when  compared to the prior study. IMPRESSION: Unchanged left lower lobe effusion and consolidation. Electronically Signed By: Skipper Cliche M.D. On: 11/03/2013 13:10  Dg Chest 2 View  11/02/2013 CLINICAL DATA: Recent diagnosis of pneumonia 2 days ago, intermittent RIGHT scapular shoulder and arm pain for 2 weeks, RIGHT flank and BILATERAL neck pain, history diabetes, hypertension, coronary artery disease EXAM: CHEST 2 VIEW COMPARISON: 10/28/2013 FINDINGS: Normal heart size post CABG. Mediastinal contours and pulmonary vascularity normal. New atelectasis and effusion at the LEFT lung base. Difficult to exclude underlying consolidation in the LEFT lower lobe. Loculated fluid within LEFT major fissure. RIGHT lung clear. No pneumothorax. Bones unremarkable. IMPRESSION: New atelectasis and pleural effusion at LEFT lung base, cannot completely exclude underlying consolidation in LEFT lower lobe. Post CABG. Electronically Signed By: Lavonia Dana M.D. On: 11/02/2013 00:17   Review of Systems  Constitutional: Positive for chills, malaise/fatigue and diaphoresis.  Musculoskeletal: Positive for back pain, myalgias and neck pain.  Neurological: Positive for weakness.   Blood pressure 162/79, pulse 87, temperature 99 F (37.2 C), temperature source Oral, resp. rate 21, SpO2 97.00%.  Physical Exam  Constitutional: He appears well-developed and well-nourished. He appears distressed.  Neurological:  Unable to perform Detailed physical exam due to patient being heavily sedated in order to obtain an MRI. He does move all his extremities. Perrl, symmetric facies Does not arouse to voice.   Assessment/Plan:  OR for urgent decompression  of the Cervical-thoracic epidural fluid collection. Risks and benefits were discussed with his wife and son in law. Bleeding, infection, need for further surgery, inability to drain fluid, paralysis, increased weakness, and other risks were explained. They understand and wish to proceed based  on my recommendation.

## 2013-11-04 NOTE — Consult Note (Addendum)
PULMONARY / CRITICAL CARE MEDICINE   Name: Michael Morton MRN: [id number] DOB: 02/19/1941    ADMISSION DATE:  11/03/2013 CONSULTATION DATE:  11/04/13  REFERRING MD :  Dr. 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  BRIEF PATIENT DESCRIPTION:  73 yo male with two weeks of weakness, shoulder/arm/back/neck pain.  MRI of spine showed C5-T4 epidural abscess.  PCCM consulted to assist with medical management in ICU.  SIGNIFICANT EVENTS: 5/26 In ER >> Abx for pneumonia 5/30 Posterior cervical and thoracic evacuation of epidural abscess, C6 and T4 laminectomies  STUDIES:  CT chest 5/26 >> small Lt effusion, ATX LLL and lingula CT abd 5/26 >> tiny Lt ureteral stone, b/l renal stones, Rt inguinal hernia  LINES / TUBES: PIV  CULTURES: Epidural abscess 5/30 >>  Blood 5/30 >>  ANTIBIOTICS: Vancomycin 5/30 >> Tressie Ellis 5/30 >> Flagyl 5/30 >>  Acyclovir 5/30 >>   SUBJECTIVE:  Denies chest pain or cough.  C/o soreness in back.  VITAL SIGNS: Temp:  [97.7 F (36.5 C)-99 F (37.2 C)] 98.6 F (37 C) (05/31 0722) Pulse Rate:  [71-102] 96 (05/31 0900) Resp:  [9-25] 22 (05/31 0900) BP: (136-177)/(58-91) 164/67 mmHg (05/31 0900) SpO2:  [91 %-100 %] 100 % (05/31 0900) FiO2 (%):  [100 %] 100 % (05/31 0645) Weight:  [199 lb 1.2 oz (90.3 kg)] 199 lb 1.2 oz (90.3 kg) (05/31 0211) INTAKE / OUTPUT: Intake/Output     05/30 0701 - 05/31 0700 05/31 0701 - 06/01 0700   P.O.  120   I.V. (mL/kg) 2100 (23.3) 160 (1.8)   IV Piggyback 50    Total Intake(mL/kg) 2150 (23.8) 280 (3.1)   Urine (mL/kg/hr) 1045 175 (0.9)   Blood 75    Total Output 1120 175   Net +1030 +105          PHYSICAL EXAMINATION: General: no distress HEENT: no oral lesions Heart: regular Lungs: no wheeze Abdomen: soft, non tender Musculoskeletal: no edema Skin: healing ulcer on Lt first toe Neuro: normal strength   LABS:  CBC  Recent Labs Lab  11/01/13 2309 11/03/13 1213 11/04/13 0339  WBC 9.6 9.7 14.9*  HGB 11.0* 10.9* 10.5*  HCT 31.8* 31.6* 31.0*  PLT 271 303 310   BMET  Recent Labs Lab 11/01/13 2309 11/03/13 1213 11/04/13 0339  NA 136* 131* 133*  K 4.4 4.9 5.1  CL 99 99 97  CO2 21 21 22   BUN 30* 22 18  CREATININE 1.30 0.97 0.97  GLUCOSE 262* 207* 270*   Electrolytes  Recent Labs Lab 11/01/13 2309 11/03/13 1213 11/04/13 0339  CALCIUM 9.0 9.0 8.6   ABG  Recent Labs Lab 11/04/13 0210  PHART 7.330*  PCO2ART 41.3  PO2ART 87.0   Liver Enzymes  Recent Labs Lab 10/30/13 2035 11/03/13 1213  AST 22 16  ALT 42 36  ALKPHOS 130* 146*  BILITOT 0.5 0.5  ALBUMIN 3.1* 2.3*   Cardiac Enzymes  Recent Labs Lab 11/01/13 2309  TROPONINI <0.30   Glucose  Recent Labs Lab 11/03/13 2348 11/04/13 0421 11/04/13 0733  GLUCAP 202* 259* 182*    Imaging Dg Chest 2 View  11/03/2013   CLINICAL DATA:  Right-sided flank pain for 2 weeks with history of kidney stones  EXAM: CHEST  2 VIEW  COMPARISON:  11/01/2013  FINDINGS: Patient status post prior CABG. Heart size and vascular pattern normal. Right lung is clear. On the left side, there is a small pleural effusion with  extensive underlying consolidation in the left lower lobe. This stable when compared to the prior study.  IMPRESSION: Unchanged left lower lobe effusion and consolidation.   Electronically Signed   By: Skipper Cliche M.D.   On: 11/03/2013 13:10   Dg Cervical Spine 1 View  11/03/2013   CLINICAL DATA:  Cervical and thoracic laminectomy for abscess.  EXAM: DG CERVICAL SPINE - 1 VIEW  COMPARISON:  Spinal MRI 5:02 p.m.  FINDINGS: Lateral cervical spine radiography for surgical localization. The cervical spine is seen to the level of the C5 vertebra, below which there is obscuration by the shoulders. Spinal numbering placed on image. There could be a probe in the dorsal soft tissues at the lower cervical or upper thoracic level, but certainty is limited  by technique.  IMPRESSION: Lateral cervical radiography for surgical localization, as above.   Electronically Signed   By: Jorje Guild M.D.   On: 11/03/2013 23:22   Mr Cervical Spine W Wo Contrast  11/03/2013   CLINICAL DATA:  Weakness. Right shoulder pain. Incontinence. Diabetic. Elevated sed rate.  EXAM: MRI CERVICAL SPINE WITHOUT AND WITH CONTRAST; MRI LUMBAR SPINE WITHOUT AND WITH CONTRAST; MRI THORACIC SPINE WITHOUT AND WITH CONTRAST  TECHNIQUE: Multiplanar and multiecho pulse sequences of the cervical spine, to include the craniocervical junction and cervicothoracic junction, were obtained according to standard protocol without and with intravenous contrast.  CONTRAST:  75mL MULTIHANCE GADOBENATE DIMEGLUMINE 529 MG/ML IV SOLN  COMPARISON:  No comparison MR available for review. CT chest, abdomen and pelvis 10/30/2013.  FINDINGS: Exam is motion degraded.  Cervical spine MR:  Epidural process suspicious for epidural abscess extends from the C5-6 level to the lower T4 level. This may originate from the right C5-6 facet joint (septic arthritis). Initially, epidural abscess is located on the right and as epidural abscess extends inferiorly traverses into a dorsal location. This causes spinal stenosis and cord flattening. No abnormal signal within the cord or abnormal enhancement of the cord.  Baseline cervical spondylotic changes including:  C2-3:  Mild central bulge.  C3-4:  Mild bulge greatest centrally.  Mild cord contact.  C4-5:  Mild bulge.  Minimal cord contact.  C5-6: Bulge/protrusion greatest right paracentral position. Mild right-sided cord contact.  C6-7: Minimal bulge.  Mild facet joint degenerative changes.  C7-T1: Facet joint degenerative changes. Minimal anterior slips C7. Minimal bulge.  Thoracic spine MR:  The epidural abscess reaches the lower T4 level as discussed above. Below this region, no thoracic epidural abscess is noted.  T12 vertebral body large hemangioma.  Enhancement inferior  aspect T10 vertebra suggestive of Schmorl's node deformity. No other findings to suggest disk space infection at this level.  Minimal Schmorl's node deformity T8-9.  Left-sided loculated pleural effusion and pneumonia. It is conceivable this contributes to the epidural infection.  Lumbar spine:  Conus L2 level. No abnormal enhancement of the distal cord, conus or nerve roots. No lumbar spine epidural abscess.  Prior laminectomy and fusion L4-5.  L3-4 facet joint degenerative changes and bulge with mild to slightly moderate spinal stenosis and mild bilateral foraminal narrowing.  L5-S1: Prominent facet joint degenerative changes and bony overgrowth. Bulge and spur with extension into the neural foramen bilaterally with bilateral foraminal narrowing and encroachment upon the exiting L5 nerve roots greater on the right. Mild spinal stenosis in the right to left direction.  IMPRESSION: Exam is motion degraded.  Cervical spine MR:  Epidural process suspicious for epidural abscess extends from the C5-6 level to the lower  T4 level. This may originate from the right C5-6 facet joint (septic arthritis). Initially, epidural abscess is located on the right and as epidural abscess extends inferiorly traverses into a dorsal location. This causes spinal stenosis and cord flattening.  Thoracic spine MR:  The epidural abscess reaches the lower T4 level as discussed above. Below this region, no thoracic epidural abscess is noted.  Left-sided loculated pleural effusion and pneumonia. It is conceivable this contributes to the epidural infection.  Lumbar spine:  No lumbar spine epidural abscess.  Prior laminectomy and fusion L4-5.  L3-4 facet joint degenerative changes and bulge with mild to slightly moderate spinal stenosis and mild bilateral foraminal narrowing.  L5-S1: Prominent facet joint degenerative changes and bony overgrowth. Bulge and spur with extension into the neural foramen bilaterally with bilateral foraminal narrowing  and encroachment upon the exiting L5 nerve roots greater on the right. Mild spinal stenosis in the right to left direction.  Critical Value/emergent results were called by telephone at the time of interpretation on 11/03/2013 at 5:35 PM to Dr. Daleen Bo , who verbally acknowledged these results. Images reviewed with Dr. Cyndy Freeze 11/03/2013 7:25 p.m.   Electronically Signed   By: Chauncey Cruel M.D.   On: 11/03/2013 20:18   Mr Thoracic Spine W Wo Contrast  11/03/2013   CLINICAL DATA:  Weakness. Right shoulder pain. Incontinence. Diabetic. Elevated sed rate.  EXAM: MRI CERVICAL SPINE WITHOUT AND WITH CONTRAST; MRI LUMBAR SPINE WITHOUT AND WITH CONTRAST; MRI THORACIC SPINE WITHOUT AND WITH CONTRAST  TECHNIQUE: Multiplanar and multiecho pulse sequences of the cervical spine, to include the craniocervical junction and cervicothoracic junction, were obtained according to standard protocol without and with intravenous contrast.  CONTRAST:  31mL MULTIHANCE GADOBENATE DIMEGLUMINE 529 MG/ML IV SOLN  COMPARISON:  No comparison MR available for review. CT chest, abdomen and pelvis 10/30/2013.  FINDINGS: Exam is motion degraded.  Cervical spine MR:  Epidural process suspicious for epidural abscess extends from the C5-6 level to the lower T4 level. This may originate from the right C5-6 facet joint (septic arthritis). Initially, epidural abscess is located on the right and as epidural abscess extends inferiorly traverses into a dorsal location. This causes spinal stenosis and cord flattening. No abnormal signal within the cord or abnormal enhancement of the cord.  Baseline cervical spondylotic changes including:  C2-3:  Mild central bulge.  C3-4:  Mild bulge greatest centrally.  Mild cord contact.  C4-5:  Mild bulge.  Minimal cord contact.  C5-6: Bulge/protrusion greatest right paracentral position. Mild right-sided cord contact.  C6-7: Minimal bulge.  Mild facet joint degenerative changes.  C7-T1: Facet joint degenerative  changes. Minimal anterior slips C7. Minimal bulge.  Thoracic spine MR:  The epidural abscess reaches the lower T4 level as discussed above. Below this region, no thoracic epidural abscess is noted.  T12 vertebral body large hemangioma.  Enhancement inferior aspect T10 vertebra suggestive of Schmorl's node deformity. No other findings to suggest disk space infection at this level.  Minimal Schmorl's node deformity T8-9.  Left-sided loculated pleural effusion and pneumonia. It is conceivable this contributes to the epidural infection.  Lumbar spine:  Conus L2 level. No abnormal enhancement of the distal cord, conus or nerve roots. No lumbar spine epidural abscess.  Prior laminectomy and fusion L4-5.  L3-4 facet joint degenerative changes and bulge with mild to slightly moderate spinal stenosis and mild bilateral foraminal narrowing.  L5-S1: Prominent facet joint degenerative changes and bony overgrowth. Bulge and spur with extension into the neural  foramen bilaterally with bilateral foraminal narrowing and encroachment upon the exiting L5 nerve roots greater on the right. Mild spinal stenosis in the right to left direction.  IMPRESSION: Exam is motion degraded.  Cervical spine MR:  Epidural process suspicious for epidural abscess extends from the C5-6 level to the lower T4 level. This may originate from the right C5-6 facet joint (septic arthritis). Initially, epidural abscess is located on the right and as epidural abscess extends inferiorly traverses into a dorsal location. This causes spinal stenosis and cord flattening.  Thoracic spine MR:  The epidural abscess reaches the lower T4 level as discussed above. Below this region, no thoracic epidural abscess is noted.  Left-sided loculated pleural effusion and pneumonia. It is conceivable this contributes to the epidural infection.  Lumbar spine:  No lumbar spine epidural abscess.  Prior laminectomy and fusion L4-5.  L3-4 facet joint degenerative changes and bulge  with mild to slightly moderate spinal stenosis and mild bilateral foraminal narrowing.  L5-S1: Prominent facet joint degenerative changes and bony overgrowth. Bulge and spur with extension into the neural foramen bilaterally with bilateral foraminal narrowing and encroachment upon the exiting L5 nerve roots greater on the right. Mild spinal stenosis in the right to left direction.  Critical Value/emergent results were called by telephone at the time of interpretation on 11/03/2013 at 5:35 PM to Dr. Daleen Bo , who verbally acknowledged these results. Images reviewed with Dr. Cyndy Freeze 11/03/2013 7:25 p.m.   Electronically Signed   By: Chauncey Cruel M.D.   On: 11/03/2013 20:18   Mr Lumbar Spine W Wo Contrast  11/03/2013   CLINICAL DATA:  Weakness. Right shoulder pain. Incontinence. Diabetic. Elevated sed rate.  EXAM: MRI CERVICAL SPINE WITHOUT AND WITH CONTRAST; MRI LUMBAR SPINE WITHOUT AND WITH CONTRAST; MRI THORACIC SPINE WITHOUT AND WITH CONTRAST  TECHNIQUE: Multiplanar and multiecho pulse sequences of the cervical spine, to include the craniocervical junction and cervicothoracic junction, were obtained according to standard protocol without and with intravenous contrast.  CONTRAST:  74mL MULTIHANCE GADOBENATE DIMEGLUMINE 529 MG/ML IV SOLN  COMPARISON:  No comparison MR available for review. CT chest, abdomen and pelvis 10/30/2013.  FINDINGS: Exam is motion degraded.  Cervical spine MR:  Epidural process suspicious for epidural abscess extends from the C5-6 level to the lower T4 level. This may originate from the right C5-6 facet joint (septic arthritis). Initially, epidural abscess is located on the right and as epidural abscess extends inferiorly traverses into a dorsal location. This causes spinal stenosis and cord flattening. No abnormal signal within the cord or abnormal enhancement of the cord.  Baseline cervical spondylotic changes including:  C2-3:  Mild central bulge.  C3-4:  Mild bulge greatest  centrally.  Mild cord contact.  C4-5:  Mild bulge.  Minimal cord contact.  C5-6: Bulge/protrusion greatest right paracentral position. Mild right-sided cord contact.  C6-7: Minimal bulge.  Mild facet joint degenerative changes.  C7-T1: Facet joint degenerative changes. Minimal anterior slips C7. Minimal bulge.  Thoracic spine MR:  The epidural abscess reaches the lower T4 level as discussed above. Below this region, no thoracic epidural abscess is noted.  T12 vertebral body large hemangioma.  Enhancement inferior aspect T10 vertebra suggestive of Schmorl's node deformity. No other findings to suggest disk space infection at this level.  Minimal Schmorl's node deformity T8-9.  Left-sided loculated pleural effusion and pneumonia. It is conceivable this contributes to the epidural infection.  Lumbar spine:  Conus L2 level. No abnormal enhancement of the distal cord, conus  or nerve roots. No lumbar spine epidural abscess.  Prior laminectomy and fusion L4-5.  L3-4 facet joint degenerative changes and bulge with mild to slightly moderate spinal stenosis and mild bilateral foraminal narrowing.  L5-S1: Prominent facet joint degenerative changes and bony overgrowth. Bulge and spur with extension into the neural foramen bilaterally with bilateral foraminal narrowing and encroachment upon the exiting L5 nerve roots greater on the right. Mild spinal stenosis in the right to left direction.  IMPRESSION: Exam is motion degraded.  Cervical spine MR:  Epidural process suspicious for epidural abscess extends from the C5-6 level to the lower T4 level. This may originate from the right C5-6 facet joint (septic arthritis). Initially, epidural abscess is located on the right and as epidural abscess extends inferiorly traverses into a dorsal location. This causes spinal stenosis and cord flattening.  Thoracic spine MR:  The epidural abscess reaches the lower T4 level as discussed above. Below this region, no thoracic epidural abscess is  noted.  Left-sided loculated pleural effusion and pneumonia. It is conceivable this contributes to the epidural infection.  Lumbar spine:  No lumbar spine epidural abscess.  Prior laminectomy and fusion L4-5.  L3-4 facet joint degenerative changes and bulge with mild to slightly moderate spinal stenosis and mild bilateral foraminal narrowing.  L5-S1: Prominent facet joint degenerative changes and bony overgrowth. Bulge and spur with extension into the neural foramen bilaterally with bilateral foraminal narrowing and encroachment upon the exiting L5 nerve roots greater on the right. Mild spinal stenosis in the right to left direction.  Critical Value/emergent results were called by telephone at the time of interpretation on 11/03/2013 at 5:35 PM to Dr. Daleen Bo , who verbally acknowledged these results. Images reviewed with Dr. Cyndy Freeze 11/03/2013 7:25 p.m.   Electronically Signed   By: Chauncey Cruel M.D.   On: 11/03/2013 20:18   ASSESSMENT / PLAN:  PULMONARY A: Lt sided ATX with small pleural effusion. Hx of OSA. P:   F/u CXR >> if ATX or effusion increase, then repeat CT chest Oxygen to keep SpO2 > 92% Bronchial hygiene CPAP qhs D/c nasal trumpet 5/31  CARDIOVASCULAR A:  Hx of CAD, HTN, hyperlipidemia. P:  Norvasc, ASA, lopressor, zocor  RENAL A:   Non-obstructive kidney stones. BPH. P:   Monitor renal fx, urine outpt, electrolytes Continue flomax  GASTROINTESTINAL A:   Nutrition. P:   Clear liquid diet for now  HEMATOLOGIC A:   Anemia of critical illness. P:  F/u CBC SCD's  INFECTIOUS A:   C5 -T4 epidural abscess. Doubt PNA. P:   Abx as above Will need ID consult later this week to assist with antibiotic regimen/duration  ENDOCRINE A:   DM. P:   SSI  NEUROLOGIC A:   S/p evacuation of epidural abscess. Hx of depression. P:   Post op care, pain control per neurosurgery Continue zoloft, trazodone, venlafaxine  Updated wife about plan.   Chesley Mires,  MD [facility] Pulmonary/Critical Care 11/04/2013, 9:26 AM Pager:  [phone] After 3pm call: [phone]

## 2013-11-04 NOTE — Progress Notes (Signed)
  D; Pt  Had respiratory distress noted.@ 0130 Notified MD, placed Rt.nasal trumpet by Dr. Ola Spurr. o2 sat dropped periodically.  Applied CPAP per order.  O2 sat kept above 94%. Pt. Moves all extremities, open eyes and back to sleep.  Not following commands. Will check blood gas by Resp. Tx. Report given to Darla ,G>RN via phone. tx pt to 3M12 @ 0154 via stretcher.

## 2013-11-04 NOTE — Progress Notes (Signed)
ANTIBIOTIC CONSULT NOTE - INITIAL  Pharmacy Consult for Vancomycin/Ceftazidime  Indication: Epidural Abscess  Allergies  Allergen Reactions  . Ace Inhibitors Other (See Comments)    Unknown   . Oxycodone Other (See Comments)    Patient states it makes him feel disoriented    Patient Measurements: Height: 6\' 2"  (188 cm) Weight: 199 lb 1.2 oz (90.3 kg) IBW/kg (Calculated) : 82.2  Vital Signs: Temp: 98.8 F (37.1 C) (05/31 0145) BP: 137/61 mmHg (05/31 0245) Pulse Rate: 96 (05/31 0245)  Labs:  Recent Labs  11/01/13 2309 11/03/13 1213  WBC 9.6 9.7  HGB 11.0* 10.9*  PLT 271 303  CREATININE 1.30 0.97   Estimated Creatinine Clearance: 81.2 ml/min (by C-G formula based on Cr of 0.97).  Medical History: Past Medical History  Diagnosis Date  . Diabetes mellitus without complication   . Hypertension   . Hypercholesteremia   . Coronary artery disease   . Pneumonia    Assessment: 73 y/o with epidural abscess s/p surgical evacuation. WBC WNL, renal function appropriate for age, other labs above have been reviewed.   Goal of Therapy:  Vancomycin trough level 15-20 mcg/ml  Plan:  -Vancomycin 1250 mg IV q12h -Ceftazidime 2g IV q8h -Flagyl per MD -Trend WBC, temp, renal function  -Drug levels as indicated  -F/U cultures from abscess  Narda Bonds 11/04/2013,3:05 AM

## 2013-11-04 NOTE — Consult Note (Addendum)
PULMONARY / CRITICAL CARE MEDICINE   Name: Michael Morton MRN: [id number] DOB: Dec 17, 1940    ADMISSION DATE:  11/03/2013 CONSULTATION DATE:  11/04/13  REFERRING MD :  Dr. 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  BRIEF PATIENT DESCRIPTION:  73 years old male with PMH relevant for DM, CAD post CABG, HTN, dyslipidemia. Presented with two weeks history of weakness, severe pain in his shoulders, arms, back and neck. Found to have left loculated pleural effusion. MRI of the spine revealed a c5-t4 epidural abscess. Neurosurgery was consulted and the patient underwent posterior cervical and thoracic evacuation of the epidural abscess as well as c6 and t4 laminectomies. PCCM consulted to assist with medical management.    SIGNIFICANT EVENTS / STUDIES:    LINES / TUBES: - Peripheral IV's  CULTURES: - Abscess culture sent  ANTIBIOTICS: - Ceftazidime - Flagyl - Vancomycin  HISTORY OF PRESENT ILLNESS:   73 years old male with PMH relevant for DM, CAD post CABG, HTN, dyslipidemia. Presented with two weeks history of weakness, severe pain in his shoulders, arms, back and neck. He also had chills and diaphoresis but no clear history of fever. He visited the ED two times last week and on his second visit he was treated for pneumonia with Augmentin after a CTA  showed a left pleural effusion with a LLL consolidation (no PE). Effusion was not sampled. He presented again to the ED yesterday with urinary incontinence, and a sensation of tingling in the right arm. MRI of the spine revealed a c5-t4 epidural abscess. Neurosurgery was consulted and the patient underwent posterior cervical and thoracic evacuation of the epidural abscess as well as c6 and t4 laminectomies. Of note, the MRI of the thoracic spine revealed that the left pleural effusion is now loculated. PCCM consult called to assist with medical management. At the time of my  examination the patient is on BiPAP due to desaturation after extubation in the OR. Breathing comfortably and saturating 98% on 40% FiO2. Hemodynamically stable. Awake, alert. Complains of significant pain on surgical site.   PAST MEDICAL HISTORY :  Past Medical History  Diagnosis Date  . Diabetes mellitus without complication   . Hypertension   . Hypercholesteremia   . Coronary artery disease   . Pneumonia    Past Surgical History  Procedure Laterality Date  . Cardiac surgery    . Back surgery    . Knee surgery    . Rotator cuff repair    . Coronary artery bypass graft    . Appendectomy     Prior to Admission medications   Medication Sig Start Date End Date Taking? Authorizing Provider  acyclovir (ZOVIRAX) 400 MG tablet Take 800 mg by mouth daily.    Yes Historical Provider, MD  amLODipine (NORVASC) 5 MG tablet Take 5 mg by mouth daily.   Yes Historical Provider, MD  amoxicillin-clavulanate (AUGMENTIN) 875-125 MG per tablet Take 1 tablet by mouth 2 (two) times daily. One po bid x 10 days 11/02/13  Yes [name], MD  aspirin 81 MG tablet Take 325 mg by mouth daily.    Yes Historical Provider, MD  etodolac (LODINE) 400 MG tablet Take 400 mg by mouth 2 (two) times daily.   Yes Historical Provider, MD  glyBURIDE (DIABETA) 5 MG tablet Take 5 mg by mouth 2 (two) times daily with a meal.   Yes Historical Provider, MD  HYDROcodone-acetaminophen (NORCO/VICODIN) 5-325 MG per tablet Take 1-2  tablets by mouth every 6 (six) hours as needed. 10/30/13  Yes [name], MD  meloxicam (MOBIC) 7.5 MG tablet Take 1 tablet (7.5 mg total) by mouth daily. 11/02/13  Yes [name], MD  methocarbamol (ROBAXIN) 500 MG tablet Take 1 tablet (500 mg total) by mouth 2 (two) times daily. 11/02/13  Yes [name], MD  metoprolol (LOPRESSOR) 50 MG tablet Take 50 mg by mouth 2 (two) times daily.   Yes Historical Provider, MD  nitroGLYCERIN (NITROSTAT) 0.4 MG SL tablet Place 0.4 mg  under the tongue every 5 (five) minutes as needed for chest pain.   Yes Historical Provider, MD  sertraline (ZOLOFT) 50 MG tablet Take 50 mg by mouth daily.   Yes Historical Provider, MD  simvastatin (ZOCOR) 40 MG tablet Take 40 mg by mouth every evening.   Yes Historical Provider, MD  tamsulosin (FLOMAX) 0.4 MG CAPS capsule Take 1 capsule (0.4 mg total) by mouth daily. 02/12/13  Yes [name], MD  traZODone (DESYREL) 50 MG tablet Take 100 mg by mouth at bedtime.    Yes Historical Provider, MD  venlafaxine (EFFEXOR) 50 MG tablet Take 25 mg by mouth 3 (three) times daily.   Yes Historical Provider, MD   Allergies  Allergen Reactions  . Ace Inhibitors Other (See Comments)    Unknown   . Oxycodone Other (See Comments)    Patient states it makes him feel disoriented    FAMILY HISTORY:  No family history on file. SOCIAL HISTORY:  reports that he has quit smoking. He does not have any smokeless tobacco history on file. He reports that he drinks alcohol. He reports that he does not use illicit drugs.  REVIEW OF SYSTEMS:   All systems reviewed and found negative except for what I mentioned in the HPI.   SUBJECTIVE:   VITAL SIGNS: Temp:  [97.7 F (36.5 C)-99 F (37.2 C)] 97.7 F (36.5 C) (05/31 0045) Pulse Rate:  [71-102] 102 (05/31 0130) Resp:  [11-25] 14 (05/31 0130) BP: (138-177)/(58-91) 153/82 mmHg (05/31 0115) SpO2:  [91 %-99 %] 96 % (05/31 0130) HEMODYNAMICS:   VENTILATOR SETTINGS:   INTAKE / OUTPUT: Intake/Output     05/30 0701 - 05/31 0700   I.V. 1700   Total Intake 1700   Urine 650   Blood 75   Total Output 725   Net +975         PHYSICAL EXAMINATION: General: Pleasant male patient in no acute distress. Eyes: Anicteric sclerae. ENT: Oropharynx clear. Moist mucous membranes. No thrush Lymph: No cervical, supraclavicular, or axillary lymphadenopathy. Heart: Normal S1, S2. No murmurs, rubs, or gallops appreciated. No bruits, equal pulses. Lungs: Normal  excursion, no dullness to percussion. Good air movement bilaterally, without wheezes or crackles. Normal upper airway sounds without evidence of stridor. Abdomen: Abdomen soft, non-tender and not distended, normoactive bowel sounds. No hepatosplenomegaly or masses. Musculoskeletal: No clubbing or synovitis. Skin: No rashes or lesions Neuro: No focal neurologic deficits.   LABS:  CBC  Recent Labs Lab 10/30/13 2035 11/01/13 2309 11/03/13 1213  WBC 11.4* 9.6 9.7  HGB 12.5* 11.0* 10.9*  HCT 35.7* 31.8* 31.6*  PLT 224 271 303   Coag's No results found for this basename: APTT, INR,  in the last 168 hours BMET  Recent Labs Lab 10/30/13 2035 11/01/13 2309 11/03/13 1213  NA 139 136* 131*  K 4.4 4.4 4.9  CL 100 99 99  CO2 24 21 21   BUN 25* 30* 22  CREATININE 1.10 1.30 0.97  GLUCOSE 243* 262* 207*   Electrolytes  Recent Labs Lab 10/30/13 2035 11/01/13 2309 11/03/13 1213  CALCIUM 9.6 9.0 9.0   Sepsis Markers No results found for this basename: LATICACIDVEN, PROCALCITON, O2SATVEN,  in the last 168 hours ABG No results found for this basename: PHART, PCO2ART, PO2ART,  in the last 168 hours Liver Enzymes  Recent Labs Lab 10/28/13 0735 10/30/13 2035 11/03/13 1213  AST 16 22 16   ALT 27 42 36  ALKPHOS 99 130* 146*  BILITOT 0.7 0.5 0.5  ALBUMIN 3.5 3.1* 2.3*   Cardiac Enzymes  Recent Labs Lab 10/28/13 0735 11/01/13 2309  TROPONINI <0.30 <0.30   Glucose  Recent Labs Lab 11/03/13 2348  GLUCAP 202*    Imaging Dg Chest 2 View  11/03/2013   CLINICAL DATA:  Right-sided flank pain for 2 weeks with history of kidney stones  EXAM: CHEST  2 VIEW  COMPARISON:  11/01/2013  FINDINGS: Patient status post prior CABG. Heart size and vascular pattern normal. Right lung is clear. On the left side, there is a small pleural effusion with extensive underlying consolidation in the left lower lobe. This stable when compared to the prior study.  IMPRESSION: Unchanged left lower  lobe effusion and consolidation.   Electronically Signed   By: Skipper Cliche M.D.   On: 11/03/2013 13:10   Dg Cervical Spine 1 View  11/03/2013   CLINICAL DATA:  Cervical and thoracic laminectomy for abscess.  EXAM: DG CERVICAL SPINE - 1 VIEW  COMPARISON:  Spinal MRI 5:02 p.m.  FINDINGS: Lateral cervical spine radiography for surgical localization. The cervical spine is seen to the level of the C5 vertebra, below which there is obscuration by the shoulders. Spinal numbering placed on image. There could be a probe in the dorsal soft tissues at the lower cervical or upper thoracic level, but certainty is limited by technique.  IMPRESSION: Lateral cervical radiography for surgical localization, as above.   Electronically Signed   By: Jorje Guild M.D.   On: 11/03/2013 23:22   Mr Cervical Spine W Wo Contrast  11/03/2013   CLINICAL DATA:  Weakness. Right shoulder pain. Incontinence. Diabetic. Elevated sed rate.  EXAM: MRI CERVICAL SPINE WITHOUT AND WITH CONTRAST; MRI LUMBAR SPINE WITHOUT AND WITH CONTRAST; MRI THORACIC SPINE WITHOUT AND WITH CONTRAST  TECHNIQUE: Multiplanar and multiecho pulse sequences of the cervical spine, to include the craniocervical junction and cervicothoracic junction, were obtained according to standard protocol without and with intravenous contrast.  CONTRAST:  45mL MULTIHANCE GADOBENATE DIMEGLUMINE 529 MG/ML IV SOLN  COMPARISON:  No comparison MR available for review. CT chest, abdomen and pelvis 10/30/2013.  FINDINGS: Exam is motion degraded.  Cervical spine MR:  Epidural process suspicious for epidural abscess extends from the C5-6 level to the lower T4 level. This may originate from the right C5-6 facet joint (septic arthritis). Initially, epidural abscess is located on the right and as epidural abscess extends inferiorly traverses into a dorsal location. This causes spinal stenosis and cord flattening. No abnormal signal within the cord or abnormal enhancement of the cord.   Baseline cervical spondylotic changes including:  C2-3:  Mild central bulge.  C3-4:  Mild bulge greatest centrally.  Mild cord contact.  C4-5:  Mild bulge.  Minimal cord contact.  C5-6: Bulge/protrusion greatest right paracentral position. Mild right-sided cord contact.  C6-7: Minimal bulge.  Mild facet joint degenerative changes.  C7-T1: Facet joint degenerative changes. Minimal anterior slips C7. Minimal bulge.  Thoracic spine  MR:  The epidural abscess reaches the lower T4 level as discussed above. Below this region, no thoracic epidural abscess is noted.  T12 vertebral body large hemangioma.  Enhancement inferior aspect T10 vertebra suggestive of Schmorl's node deformity. No other findings to suggest disk space infection at this level.  Minimal Schmorl's node deformity T8-9.  Left-sided loculated pleural effusion and pneumonia. It is conceivable this contributes to the epidural infection.  Lumbar spine:  Conus L2 level. No abnormal enhancement of the distal cord, conus or nerve roots. No lumbar spine epidural abscess.  Prior laminectomy and fusion L4-5.  L3-4 facet joint degenerative changes and bulge with mild to slightly moderate spinal stenosis and mild bilateral foraminal narrowing.  L5-S1: Prominent facet joint degenerative changes and bony overgrowth. Bulge and spur with extension into the neural foramen bilaterally with bilateral foraminal narrowing and encroachment upon the exiting L5 nerve roots greater on the right. Mild spinal stenosis in the right to left direction.  IMPRESSION: Exam is motion degraded.  Cervical spine MR:  Epidural process suspicious for epidural abscess extends from the C5-6 level to the lower T4 level. This may originate from the right C5-6 facet joint (septic arthritis). Initially, epidural abscess is located on the right and as epidural abscess extends inferiorly traverses into a dorsal location. This causes spinal stenosis and cord flattening.  Thoracic spine MR:  The epidural  abscess reaches the lower T4 level as discussed above. Below this region, no thoracic epidural abscess is noted.  Left-sided loculated pleural effusion and pneumonia. It is conceivable this contributes to the epidural infection.  Lumbar spine:  No lumbar spine epidural abscess.  Prior laminectomy and fusion L4-5.  L3-4 facet joint degenerative changes and bulge with mild to slightly moderate spinal stenosis and mild bilateral foraminal narrowing.  L5-S1: Prominent facet joint degenerative changes and bony overgrowth. Bulge and spur with extension into the neural foramen bilaterally with bilateral foraminal narrowing and encroachment upon the exiting L5 nerve roots greater on the right. Mild spinal stenosis in the right to left direction.  Critical Value/emergent results were called by telephone at the time of interpretation on 11/03/2013 at 5:35 PM to Dr. Daleen Bo , who verbally acknowledged these results. Images reviewed with Dr. Cyndy Freeze 11/03/2013 7:25 p.m.   Electronically Signed   By: Chauncey Cruel M.D.   On: 11/03/2013 20:18   Mr Thoracic Spine W Wo Contrast  11/03/2013   CLINICAL DATA:  Weakness. Right shoulder pain. Incontinence. Diabetic. Elevated sed rate.  EXAM: MRI CERVICAL SPINE WITHOUT AND WITH CONTRAST; MRI LUMBAR SPINE WITHOUT AND WITH CONTRAST; MRI THORACIC SPINE WITHOUT AND WITH CONTRAST  TECHNIQUE: Multiplanar and multiecho pulse sequences of the cervical spine, to include the craniocervical junction and cervicothoracic junction, were obtained according to standard protocol without and with intravenous contrast.  CONTRAST:  16mL MULTIHANCE GADOBENATE DIMEGLUMINE 529 MG/ML IV SOLN  COMPARISON:  No comparison MR available for review. CT chest, abdomen and pelvis 10/30/2013.  FINDINGS: Exam is motion degraded.  Cervical spine MR:  Epidural process suspicious for epidural abscess extends from the C5-6 level to the lower T4 level. This may originate from the right C5-6 facet joint (septic  arthritis). Initially, epidural abscess is located on the right and as epidural abscess extends inferiorly traverses into a dorsal location. This causes spinal stenosis and cord flattening. No abnormal signal within the cord or abnormal enhancement of the cord.  Baseline cervical spondylotic changes including:  C2-3:  Mild central bulge.  C3-4:  Mild  bulge greatest centrally.  Mild cord contact.  C4-5:  Mild bulge.  Minimal cord contact.  C5-6: Bulge/protrusion greatest right paracentral position. Mild right-sided cord contact.  C6-7: Minimal bulge.  Mild facet joint degenerative changes.  C7-T1: Facet joint degenerative changes. Minimal anterior slips C7. Minimal bulge.  Thoracic spine MR:  The epidural abscess reaches the lower T4 level as discussed above. Below this region, no thoracic epidural abscess is noted.  T12 vertebral body large hemangioma.  Enhancement inferior aspect T10 vertebra suggestive of Schmorl's node deformity. No other findings to suggest disk space infection at this level.  Minimal Schmorl's node deformity T8-9.  Left-sided loculated pleural effusion and pneumonia. It is conceivable this contributes to the epidural infection.  Lumbar spine:  Conus L2 level. No abnormal enhancement of the distal cord, conus or nerve roots. No lumbar spine epidural abscess.  Prior laminectomy and fusion L4-5.  L3-4 facet joint degenerative changes and bulge with mild to slightly moderate spinal stenosis and mild bilateral foraminal narrowing.  L5-S1: Prominent facet joint degenerative changes and bony overgrowth. Bulge and spur with extension into the neural foramen bilaterally with bilateral foraminal narrowing and encroachment upon the exiting L5 nerve roots greater on the right. Mild spinal stenosis in the right to left direction.  IMPRESSION: Exam is motion degraded.  Cervical spine MR:  Epidural process suspicious for epidural abscess extends from the C5-6 level to the lower T4 level. This may originate  from the right C5-6 facet joint (septic arthritis). Initially, epidural abscess is located on the right and as epidural abscess extends inferiorly traverses into a dorsal location. This causes spinal stenosis and cord flattening.  Thoracic spine MR:  The epidural abscess reaches the lower T4 level as discussed above. Below this region, no thoracic epidural abscess is noted.  Left-sided loculated pleural effusion and pneumonia. It is conceivable this contributes to the epidural infection.  Lumbar spine:  No lumbar spine epidural abscess.  Prior laminectomy and fusion L4-5.  L3-4 facet joint degenerative changes and bulge with mild to slightly moderate spinal stenosis and mild bilateral foraminal narrowing.  L5-S1: Prominent facet joint degenerative changes and bony overgrowth. Bulge and spur with extension into the neural foramen bilaterally with bilateral foraminal narrowing and encroachment upon the exiting L5 nerve roots greater on the right. Mild spinal stenosis in the right to left direction.  Critical Value/emergent results were called by telephone at the time of interpretation on 11/03/2013 at 5:35 PM to Dr. Daleen Bo , who verbally acknowledged these results. Images reviewed with Dr. Cyndy Freeze 11/03/2013 7:25 p.m.   Electronically Signed   By: Chauncey Cruel M.D.   On: 11/03/2013 20:18   Mr Lumbar Spine W Wo Contrast  11/03/2013   CLINICAL DATA:  Weakness. Right shoulder pain. Incontinence. Diabetic. Elevated sed rate.  EXAM: MRI CERVICAL SPINE WITHOUT AND WITH CONTRAST; MRI LUMBAR SPINE WITHOUT AND WITH CONTRAST; MRI THORACIC SPINE WITHOUT AND WITH CONTRAST  TECHNIQUE: Multiplanar and multiecho pulse sequences of the cervical spine, to include the craniocervical junction and cervicothoracic junction, were obtained according to standard protocol without and with intravenous contrast.  CONTRAST:  33mL MULTIHANCE GADOBENATE DIMEGLUMINE 529 MG/ML IV SOLN  COMPARISON:  No comparison MR available for review. CT  chest, abdomen and pelvis 10/30/2013.  FINDINGS: Exam is motion degraded.  Cervical spine MR:  Epidural process suspicious for epidural abscess extends from the C5-6 level to the lower T4 level. This may originate from the right C5-6 facet joint (septic arthritis). Initially, epidural  abscess is located on the right and as epidural abscess extends inferiorly traverses into a dorsal location. This causes spinal stenosis and cord flattening. No abnormal signal within the cord or abnormal enhancement of the cord.  Baseline cervical spondylotic changes including:  C2-3:  Mild central bulge.  C3-4:  Mild bulge greatest centrally.  Mild cord contact.  C4-5:  Mild bulge.  Minimal cord contact.  C5-6: Bulge/protrusion greatest right paracentral position. Mild right-sided cord contact.  C6-7: Minimal bulge.  Mild facet joint degenerative changes.  C7-T1: Facet joint degenerative changes. Minimal anterior slips C7. Minimal bulge.  Thoracic spine MR:  The epidural abscess reaches the lower T4 level as discussed above. Below this region, no thoracic epidural abscess is noted.  T12 vertebral body large hemangioma.  Enhancement inferior aspect T10 vertebra suggestive of Schmorl's node deformity. No other findings to suggest disk space infection at this level.  Minimal Schmorl's node deformity T8-9.  Left-sided loculated pleural effusion and pneumonia. It is conceivable this contributes to the epidural infection.  Lumbar spine:  Conus L2 level. No abnormal enhancement of the distal cord, conus or nerve roots. No lumbar spine epidural abscess.  Prior laminectomy and fusion L4-5.  L3-4 facet joint degenerative changes and bulge with mild to slightly moderate spinal stenosis and mild bilateral foraminal narrowing.  L5-S1: Prominent facet joint degenerative changes and bony overgrowth. Bulge and spur with extension into the neural foramen bilaterally with bilateral foraminal narrowing and encroachment upon the exiting L5 nerve roots  greater on the right. Mild spinal stenosis in the right to left direction.  IMPRESSION: Exam is motion degraded.  Cervical spine MR:  Epidural process suspicious for epidural abscess extends from the C5-6 level to the lower T4 level. This may originate from the right C5-6 facet joint (septic arthritis). Initially, epidural abscess is located on the right and as epidural abscess extends inferiorly traverses into a dorsal location. This causes spinal stenosis and cord flattening.  Thoracic spine MR:  The epidural abscess reaches the lower T4 level as discussed above. Below this region, no thoracic epidural abscess is noted.  Left-sided loculated pleural effusion and pneumonia. It is conceivable this contributes to the epidural infection.  Lumbar spine:  No lumbar spine epidural abscess.  Prior laminectomy and fusion L4-5.  L3-4 facet joint degenerative changes and bulge with mild to slightly moderate spinal stenosis and mild bilateral foraminal narrowing.  L5-S1: Prominent facet joint degenerative changes and bony overgrowth. Bulge and spur with extension into the neural foramen bilaterally with bilateral foraminal narrowing and encroachment upon the exiting L5 nerve roots greater on the right. Mild spinal stenosis in the right to left direction.  Critical Value/emergent results were called by telephone at the time of interpretation on 11/03/2013 at 5:35 PM to Dr. Daleen Bo , who verbally acknowledged these results. Images reviewed with Dr. Cyndy Freeze 11/03/2013 7:25 p.m.   Electronically Signed   By: Chauncey Cruel M.D.   On: 11/03/2013 20:18     CXR:  10/28/13: Normal chest X ray 11/01/13: Left pleural effusion and consolidation 11/03/13 Unchanged LLL consolidation and pleural effusion.    ASSESSMENT / PLAN:  PULMONARY A: 1) LLL pneumonia with loculated pleural effusion. I personally looked with the [place] and there are areas of consolidated lung and fluid but could not identify a pocket amenable for safe  bedside thoracentesis. Specially that the patient is in significant pain and difficult to position.  2) On BiPAP due to desaturation after extubation in the OR. Now  stable. P:   - Continue BiPAP overnight - Would recommend IR consult for thoracentesis and pigtail chest tube placement. Please send fluid for light's criteria and cultures.  - Consider repeat non contrast CT of the chest to better define pleural effusion.  - Antibiotics on ID section.  CARDIOVASCULAR A:  1) CAD stable 2) Hemodynamically stable 3) Hypertension P:  - ICU monitoring - Continue home antihypertensives  RENAL A:   1) Normal creatinine 2) Mild hyponatremia P:   - Will follow BMP in am  GASTROINTESTINAL A:   1) No issues P:     HEMATOLOGIC A:   1) Anemia stable P:  - SCD's - DVT prophylaxis when cleared by surgical team.  INFECTIOUS A:   1) Epidural abscess c5-t4 post surgical evacuation 2) Left lower lobe pneumonia with loculated pleural effusion P:   - Ceftazidime - Flagyl - Vancomycin - Will follow cultures and adjust antibiotics accordingly.   ENDOCRINE A:   1) DM P:   - Novolog SS per ICU hyperglycemia protocol   NEUROLOGIC A:   1) c5-t4 epidural abscess s/p evacuation and c6 and t4 laminectomies. 2) Awake, alert, no focal neurological signs P:   - Wound care per surgical team  TODAY'S SUMMARY:   I have personally obtained a history, examined the patient, evaluated laboratory and imaging results, formulated the assessment and plan and placed orders. CRITICAL CARE: Critical Care Time devoted to patient care services described in this note is 60 minutes.   Waynetta Pean, MD Pulmonary and [place] Pager: [phone]  11/04/2013, 1:50 AM

## 2013-11-04 NOTE — Progress Notes (Signed)
Patient placed on CPAP per MD order due to difficulty waking up after surgery.  Prior to CPAP placement, patient appeared to be obstructing with most breaths and had sats 75-85% on 10L simple mask.  On CPAP 15 cmH20 with 10L bleed in sats are consistently 95-97%, RR 16, HR 101.  Nasal trumpet is in place throughout process in R nare.  RN at bedside.

## 2013-11-05 ENCOUNTER — Inpatient Hospital Stay (HOSPITAL_COMMUNITY): Payer: Medicare Other

## 2013-11-05 DIAGNOSIS — R0902 Hypoxemia: Secondary | ICD-10-CM

## 2013-11-05 DIAGNOSIS — J9811 Atelectasis: Secondary | ICD-10-CM

## 2013-11-05 DIAGNOSIS — A419 Sepsis, unspecified organism: Secondary | ICD-10-CM

## 2013-11-05 DIAGNOSIS — J9819 Other pulmonary collapse: Secondary | ICD-10-CM

## 2013-11-05 LAB — CBC
HCT: 28.5 % — ABNORMAL LOW (ref 39.0–52.0)
Hemoglobin: 9.7 g/dL — ABNORMAL LOW (ref 13.0–17.0)
MCH: 32.6 pg (ref 26.0–34.0)
MCHC: 34 g/dL (ref 30.0–36.0)
MCV: 95.6 fL (ref 78.0–100.0)
Platelets: 333 10*3/uL (ref 150–400)
RBC: 2.98 MIL/uL — ABNORMAL LOW (ref 4.22–5.81)
RDW: 12.5 % (ref 11.5–15.5)
WBC: 13.2 10*3/uL — ABNORMAL HIGH (ref 4.0–10.5)

## 2013-11-05 LAB — GLUCOSE, CAPILLARY
Glucose-Capillary: 137 mg/dL — ABNORMAL HIGH (ref 70–99)
Glucose-Capillary: 144 mg/dL — ABNORMAL HIGH (ref 70–99)
Glucose-Capillary: 179 mg/dL — ABNORMAL HIGH (ref 70–99)
Glucose-Capillary: 187 mg/dL — ABNORMAL HIGH (ref 70–99)
Glucose-Capillary: 190 mg/dL — ABNORMAL HIGH (ref 70–99)

## 2013-11-05 LAB — BASIC METABOLIC PANEL
BUN: 16 mg/dL (ref 6–23)
CO2: 24 mEq/L (ref 19–32)
CREATININE: 0.91 mg/dL (ref 0.50–1.35)
Calcium: 8.3 mg/dL — ABNORMAL LOW (ref 8.4–10.5)
Chloride: 99 mEq/L (ref 96–112)
GFR calc Af Amer: >90 mL/min (ref >90)
GFR calc non Af Amer: 83 mL/min — ABNORMAL LOW (ref >90)
GLUCOSE: 135 mg/dL — AB (ref 70–99)
Potassium: 3.8 mEq/L (ref 3.7–5.3)
Sodium: 135 mEq/L — ABNORMAL LOW (ref 137–147)

## 2013-11-05 MED ORDER — INSULIN ASPART 100 UNIT/ML ~~LOC~~ SOLN
0.0000 [IU] | Freq: Three times a day (TID) | SUBCUTANEOUS | Status: DC
  Administered 2013-11-05: 3 [IU] via SUBCUTANEOUS
  Administered 2013-11-06: 5 [IU] via SUBCUTANEOUS
  Administered 2013-11-06: 2 [IU] via SUBCUTANEOUS
  Administered 2013-11-07 (×2): 5 [IU] via SUBCUTANEOUS
  Administered 2013-11-07 – 2013-11-08 (×4): 3 [IU] via SUBCUTANEOUS
  Administered 2013-11-09: 5 [IU] via SUBCUTANEOUS
  Administered 2013-11-09 (×2): 3 [IU] via SUBCUTANEOUS
  Administered 2013-11-10: 2 [IU] via SUBCUTANEOUS
  Administered 2013-11-10: 5 [IU] via SUBCUTANEOUS
  Administered 2013-11-10: 3 [IU] via SUBCUTANEOUS
  Administered 2013-11-11 (×2): 2 [IU] via SUBCUTANEOUS
  Administered 2013-11-11 – 2013-11-12 (×2): 3 [IU] via SUBCUTANEOUS
  Administered 2013-11-12: 2 [IU] via SUBCUTANEOUS
  Administered 2013-11-13: 3 [IU] via SUBCUTANEOUS

## 2013-11-05 NOTE — Progress Notes (Signed)
Patient ID: Michael Morton, male   DOB: [birth year], 73 y.o.   MRN: [id number] BP 172/63  Pulse 91  Temp(Src) 97.9 F (36.6 C) (Oral)  Resp 20  Ht 6\' 2"  (1.88 m)  Wt 90.3 kg (199 lb 1.2 oz)  BMI 25.55 kg/m2  SpO2 95% Alert and oriented x 4, speech is clear, fluent Perrl, full eom Moving upper extremities well Wound is clean, dry, no signs of infection Will consult ID, also hospitalists for pneumonia treatment

## 2013-11-05 NOTE — Evaluation (Signed)
Occupational Therapy Evaluation Patient Details Name: Michael Morton MRN: [id number] DOB: 1940-11-11 Today's Date: 11/05/2013    History of Present Illness Pt is a 73 yo male whom over the last 2 weeks has complained of increasingly severe pain in his shoulders, arms, back, and neck. He has been lethargic, fatigued, and weak. He has made multiple visits to the ER in the last week. He was found to have a pleural effusion, and a consolidated left lower lobe.The effusion was not tapped, he was given antibiotics to treat an assumed pneumonia. Finally today when he returned to the ED an MRI of the Cspine was ordered which revealed an epidural fluid collection consistent with an epidural abcess. On 5/30 pt s/p POSTERIOR CERVICAL AND THORACIC EVACUATION OF EPIDURAL ABSCESS, C6 AND T4 LAMINECTOMIES.   Clinical Impression   Pt admitted with above. He demonstrates the below listed deficits and will benefit from continued OT to maximize safety and independence with BADLs.  Pt presents to OT with impaired balance and surgical pain 3/10.  He currently, requires min A for BADLs, and anticipate he will quickly return to modified independent level.   Will follow.       Follow Up Recommendations  No OT follow up;Supervision - Intermittent    Equipment Recommendations  None recommended by OT    Recommendations for Other Services       Precautions / Restrictions Precautions Precautions: Cervical Precaution Comments: Reviewed cervical precautions with pt.      Mobility Bed Mobility                  Transfers Overall transfer level: Needs assistance Equipment used: 1 person hand held assist Transfers: Sit to/from Stand;Stand Pivot Transfers Sit to Stand: Min assist Stand pivot transfers: Min assist       General transfer comment: Min A to steady    Balance Overall balance assessment: Needs assistance Sitting-balance support: Feet supported;No upper extremity supported Sitting  balance-Leahy Scale: Good     Standing balance support: No upper extremity supported Standing balance-Leahy Scale: Fair                              ADL Overall ADL's : Needs assistance/impaired Eating/Feeding: Independent;Sitting   Grooming: Wash/dry hands;Wash/dry face;Oral care;Brushing hair;Minimal assistance;Standing   Upper Body Bathing: Supervision/ safety;Sitting   Lower Body Bathing: Minimal assistance;Sit to/from stand   Upper Body Dressing : Supervision/safety;Sitting   Lower Body Dressing: Minimal assistance;Sit to/from stand   Toilet Transfer: Minimal assistance;Ambulation;Comfort height toilet   Toileting- Clothing Manipulation and Hygiene: Minimal assistance;Sit to/from stand       Functional mobility during ADLs: Minimal assistance General ADL Comments: Pt requires min A due to balance deficits.  Pt ambulates with min A/HHA with occasional staggering noted.       Vision                     Perception     Praxis      Pertinent Vitals/Pain 3/10 surgical neck pain.   See vitals flow sheet.      Hand Dominance Right   Extremity/Trunk Assessment Upper Extremity Assessment Upper Extremity Assessment: Generalized weakness   Lower Extremity Assessment Lower Extremity Assessment: Defer to PT evaluation   Cervical / Trunk Assessment Cervical / Trunk Assessment: Kyphotic (recent cervical/thoracic surgery)   Communication Communication Communication: No difficulties   Cognition Arousal/Alertness: Awake/alert Behavior During Therapy: Flat affect (depressed mood) Overall Cognitive  Status: Within Functional Limits for tasks assessed                     General Comments       Exercises       Shoulder Instructions      Home Living Family/patient expects to be discharged to:: Private residence Living Arrangements: Spouse/significant other Available Help at Discharge: Family;Available 24 hours/day Type of Home:  House Home Access: Stairs to enter [employer] of Steps: 1   Home Layout: One level     Bathroom Shower/Tub: Occupational psychologist: Standard     Home Equipment: None          Prior Functioning/Environment Level of Independence: Independent             OT Diagnosis: Generalized weakness;Acute pain   OT Problem List: Decreased strength;Decreased activity tolerance;Impaired balance (sitting and/or standing);Decreased knowledge of use of DME or AE;Decreased knowledge of precautions;Pain   OT Treatment/Interventions: Self-care/ADL training;DME and/or AE instruction;Therapeutic activities;Patient/family education;Balance training    OT Goals(Current goals can be found in the care plan section) Acute Rehab OT Goals Patient Stated Goal: To play golf Time For Goal Achievement: 11/12/13 Potential to Achieve Goals: Good ADL Goals Pt Will Perform Grooming: with modified independence;standing Pt Will Perform Lower Body Bathing: with modified independence;sit to/from stand Pt Will Perform Lower Body Dressing: with modified independence;sit to/from stand Pt Will Transfer to Toilet: with modified independence;ambulating;regular height toilet;grab bars Pt Will Perform Toileting - Clothing Manipulation and hygiene: with modified independence;sit to/from stand Pt Will Perform Tub/Shower Transfer: Shower transfer;with modified independence;ambulating;shower seat;grab bars Additional ADL Goal #1: Pt will be independent with cervical precautions  OT Frequency: Min 2X/week   Barriers to D/C:            Co-evaluation              End of Session Nurse Communication: Mobility status  Activity Tolerance: Patient tolerated treatment well Patient left: in chair;with call bell/phone within reach   Time: 1243-1312 OT Time Calculation (min): 29 min Charges:  OT General Charges $OT Visit: 1 Procedure OT Evaluation $Initial OT Evaluation Tier I: 1 Procedure OT  Treatments $Self Care/Home Management : 8-22 mins G-Codes:    Surah Pelley M Braylen Staller 2013-12-01, 2:12 PM

## 2013-11-05 NOTE — Progress Notes (Signed)
RT spoke with patient in regards to wearing the CPAP. Patient states that he wears CPAP off/on and didn't think he wanted to wear the CPAP. RT advised the patient of the importance and if he changed his mind to give Respiratory a call. RT will continue to monitor.

## 2013-11-05 NOTE — Progress Notes (Signed)
PULMONARY / CRITICAL CARE MEDICINE   Name: Michael Morton MRN: [id number] DOB: 30-Nov-1940    ADMISSION DATE:  11/03/2013 CONSULTATION DATE:  11/04/13  REFERRING MD :  Dr. 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  BRIEF PATIENT DESCRIPTION:  73 yo male with two weeks of weakness, shoulder/arm/back/neck pain.  MRI of spine showed C5-T4 epidural abscess.  PCCM consulted to assist with medical management in ICU.  SIGNIFICANT EVENTS: 5/26 In ER >> Abx for pneumonia 5/30 Posterior cervical and thoracic evacuation of epidural abscess, C6 and T4 laminectomies  STUDIES:  CT chest 5/26 >> small Lt effusion, ATX LLL and lingula CT abd 5/26 >> tiny Lt ureteral stone, b/l renal stones, Rt inguinal hernia  LINES / TUBES: PIV  CULTURES: Epidural abscess 5/30 >> staph aureus Blood 5/30 >> s  ANTIBIOTICS: Per ID service   SUBJECTIVE:  No distress. No complaints  VITAL SIGNS: Temp:  [97.8 F (36.6 C)-101.9 F (38.8 C)] 97.9 F (36.6 C) (06/01 1655) Pulse Rate:  [76-92] 91 (06/01 1655) Resp:  [16-24] 20 (06/01 1655) BP: (130-172)/(50-71) 172/63 mmHg (06/01 1655) SpO2:  [90 %-97 %] 95 % (06/01 1655) INTAKE / OUTPUT: Intake/Output     05/31 0701 - 06/01 0700 06/01 0701 - 06/02 0700   P.O. 1080 480   I.V. (mL/kg) 1680 (18.6) 290 (3.2)   IV Piggyback 550 400   Total Intake(mL/kg) 3310 (36.7) 1170 (13)   Urine (mL/kg/hr) 2970 (1.4) 725 (0.7)   Blood     Total Output 2970 725   Net +340 +445          PHYSICAL EXAMINATION: General: no distress HEENT: WNL Heart: regular Lungs: no wheeze Abdomen: soft, non tender Musculoskeletal: no edema Skin: healing ulcer on Lt first toe Neuro: normal strength   LABS: I have reviewed all of today's lab results. Relevant abnormalities are discussed in the A/P section  CXR: improved LLL aeration   ASSESSMENT / PLAN: PULMONARY A: Lt sided ATX with small pleural  effusion, improving Hx of OSA. P:   Cont oxygen to keep SpO2 > 92% CPAP qhs  CARDIOVASCULAR A:  Hx of CAD, HTN, hyperlipidemia. P:  Norvasc, ASA, lopressor, zocor  RENAL A:   Non-obstructive kidney stones. BPH. P:   Monitor renal fx, urine outpt, electrolytes Continue flomax  GASTROINTESTINAL A:   Nutrition. P:   Diet as ordered  HEMATOLOGIC A:   Anemia of critical illness. P:  F/u CBC intermittently SCD's  INFECTIOUS A:   C5 -T4 epidural abscess. Doubt PNA. P:   ID service managing  ENDOCRINE A:   DM. P:   SSI adjusted  NEUROLOGIC A:   S/P drainage of epidural abscess. Hx of depression. P:   Post op care, pain control per neurosurgery Continue zoloft, trazodone, venlafaxine   PCCM will sign off. Please call if we can be of further assistance  Merton Border, MD ; [facility] [phone].  After 5:30 PM or weekends, call [phone]

## 2013-11-06 ENCOUNTER — Encounter (HOSPITAL_COMMUNITY): Payer: Self-pay | Admitting: Neurosurgery

## 2013-11-06 LAB — TISSUE CULTURE

## 2013-11-06 LAB — CULTURE, ROUTINE-ABSCESS

## 2013-11-06 LAB — GLUCOSE, CAPILLARY
GLUCOSE-CAPILLARY: 250 mg/dL — AB (ref 70–99)
GLUCOSE-CAPILLARY: 171 mg/dL — AB (ref 70–99)
Glucose-Capillary: 136 mg/dL — ABNORMAL HIGH (ref 70–99)
Glucose-Capillary: 122 mg/dL — ABNORMAL HIGH (ref 70–99)

## 2013-11-06 MED ORDER — CEFAZOLIN SODIUM-DEXTROSE 2-3 GM-% IV SOLR
2.0000 g | Freq: Three times a day (TID) | INTRAVENOUS | Status: DC
  Filled 2013-11-06 (×2): qty 50

## 2013-11-06 MED ORDER — MANAGING BACK PAIN BOOK
Freq: Once | Status: AC
  Administered 2013-11-06: 01:00:00
  Filled 2013-11-06: qty 1

## 2013-11-06 MED ORDER — LIVING WELL WITH DIABETES BOOK
Freq: Once | Status: AC
  Administered 2013-11-06: 01:00:00
  Filled 2013-11-06: qty 1

## 2013-11-06 MED ORDER — NAFCILLIN SODIUM 2 G IJ SOLR
2.0000 g | INTRAVENOUS | Status: DC
  Administered 2013-11-06 – 2013-11-07 (×6): 2 g via INTRAVENOUS
  Filled 2013-11-06 (×10): qty 2000

## 2013-11-06 MED ORDER — SODIUM CHLORIDE 0.9 % IJ SOLN: 10.0000 mL | INTRAMUSCULAR | Status: DC | PRN

## 2013-11-06 NOTE — Progress Notes (Signed)
Patient ID: Michael Morton, male   DOB: April 12, 1941, 73 y.o.   MRN: [id number] BP 148/61  Pulse 81  Temp(Src) 98.8 F (37.1 C) (Oral)  Resp 20  Ht 6\' 2"  (1.88 m)  Wt 90.3 kg (199 lb 1.2 oz)  BMI 25.55 kg/m2  SpO2 94% Alert and oriented x 4 Moving all extremities well Wound is clean, dry, and without signs of infection Will place picc Very much appreciate ID help.

## 2013-11-06 NOTE — Consult Note (Signed)
[facility]  Total days of antibiotics 4        Day 4 vanco        Day 4 ceftaz        Day 4 metro        Day 3 acyclovir       Reason for Consult: MSSA epidural abscess s/p C6-T4 laminectomy and washout    Referring Physician: Christella Noa  Active Problems:   Abscess in epidural space of cervical spine   Sepsis(995.91)   Atelectasis   Hypoxemia    HPI: Doc Mandala is a 73 y.o. male  with PMH relevant for DM, CAD post CABG, HTN, HLD presented to #D on 5/30 with two weeks history of weakness, severe pain in his shoulders, arms, back and neck with urinary incontinence and right arm paresthesia in additiion chills and diaphoresis but no clear history of fever. He several visits to ED in the last 2 wk including dx of pneumonia with  CTA showed a left pleural effusion with a LLL consolidation (no PE). Effusion was not sampled. Discharged on augmentin. Due to worsening neuro symptoms, he underwent MRI of the spine revealed a c5-t4 epidural abscess. Cabbell perofrmed posterior cervical and thoracic evacuation of the epidural abscess as well as c6 and t4 laminectomies, pus under pressure noted in OR note on 5/30. OR cultures isolated MSSA but blood cx NGTD. ID called for management of epidural abscess. Patient was placed on empiric regimen of vanco, ceftaz, metronidazole plus acyclovir   Past Medical History  Diagnosis Date  . Diabetes mellitus without complication   . Hypertension   . Hypercholesteremia   . Coronary artery disease   . Pneumonia     Allergies:  Allergies  Allergen Reactions  . Ace Inhibitors Other (See Comments)    Unknown   . Oxycodone Other (See Comments)    Patient states it makes him feel disoriented      MEDICATIONS: . acyclovir  800 mg Oral Daily  . amLODipine  5 mg Oral Daily  . aspirin EC  81 mg Oral Daily  . atorvastatin  20 mg Oral q1800  . glyBURIDE  5 mg Oral BID WC  . insulin aspart  0-15 Units Subcutaneous TID WC  .  metoprolol  50 mg Oral BID  . nafcillin IV  2 g Intravenous 6 times per day  . senna  1 tablet Oral BID  . sertraline  50 mg Oral Daily  . sodium chloride  3 mL Intravenous Q12H  . tamsulosin  0.4 mg Oral Daily  . traZODone  100 mg Oral QHS  . venlafaxine  25 mg Oral TID    History  Substance Use Topics  . Smoking status: Former Research scientist (life sciences)  . Smokeless tobacco: Not on file  . Alcohol Use: Yes    No family history on file.   Review of Systems  Constitutional: positive for chills and diaphoresis. But negative for fever,  activity change, appetite change, fatigue and unexpected weight change.  HENT: Negative for congestion, sore throat, rhinorrhea, sneezing, trouble swallowing and sinus pressure.  Eyes: Negative for photophobia and visual disturbance.  Respiratory: Negative for cough, chest tightness, shortness of breath, wheezing and stridor.  Cardiovascular: Negative for chest pain, palpitations and leg swelling.  Gastrointestinal: Negative for nausea, vomiting, abdominal pain, diarrhea, constipation, blood in stool, abdominal distention and anal bleeding.  Genitourinary: Negative for dysuria, hematuria, flank pain and difficulty urinating.  Musculoskeletal: positive for myalgias, back pain,  joint swelling, arthralgias and gait problem.  Skin: Negative for color change, pallor, rash and wound.  Neurological: Negative for dizziness, tremors, weakness and light-headedness.  Hematological: Negative for adenopathy. Does not bruise/bleed easily.  Psychiatric/Behavioral: Negative for behavioral problems, confusion, sleep disturbance, dysphoric mood, decreased concentration and agitation.     OBJECTIVE: Temp:  [97.9 F (36.6 C)-99.7 F (37.6 C)] 99 F (37.2 C) (06/02 1354) Pulse Rate:  [76-97] 84 (06/02 1354) Resp:  [20] 20 (06/02 1354) BP: (122-172)/(54-69) 130/59 mmHg (06/02 1354) SpO2:  [90 %-97 %] 96 % (06/02 1354)  Constitutional: He is oriented to person, place, and time. He  appears well-developed and well-nourished. No distress.  HENT:  Mouth/Throat: Oropharynx is clear and moist. No oropharyngeal exudate.  Cardiovascular: Normal rate, regular rhythm and normal heart sounds. Exam reveals no gallop and no friction rub.  No murmur heard.  Pulmonary/Chest: Effort normal and breath sounds normal. No respiratory distress. He has no wheezes.  Abdominal: Soft. Bowel sounds are normal. He exhibits no distension. There is no tenderness.  Lymphadenopathy:  He has no cervical adenopathy.  Neurological: He is alert and oriented to person, place, and time.  Skin: Skin is warm and dry. No rash noted. No erythema.  Psychiatric: He has a normal mood and affect. His behavior is normal.    LABS: Results for orders placed during the hospital encounter of 11/03/13 (from the past 48 hour(s))  GLUCOSE, CAPILLARY     Status: Abnormal   Collection Time    11/04/13  3:41 PM      Result Value Ref Range   Glucose-Capillary 187 (*) 70 - 99 mg/dL  GLUCOSE, CAPILLARY     Status: Abnormal   Collection Time    11/04/13  7:27 PM      Result Value Ref Range   Glucose-Capillary 148 (*) 70 - 99 mg/dL  GLUCOSE, CAPILLARY     Status: Abnormal   Collection Time    11/05/13 12:08 AM      Result Value Ref Range   Glucose-Capillary 144 (*) 70 - 99 mg/dL   Comment 1 Documented in Chart     Comment 2 Notify RN    BASIC METABOLIC PANEL     Status: Abnormal   Collection Time    11/05/13  2:44 AM      Result Value Ref Range   Sodium 135 (*) 137 - 147 mEq/L   Potassium 3.8  3.7 - 5.3 mEq/L   Comment: DELTA CHECK NOTED   Chloride 99  96 - 112 mEq/L   CO2 24  19 - 32 mEq/L   Glucose, Bld 135 (*) 70 - 99 mg/dL   BUN 16  6 - 23 mg/dL   Creatinine, Ser 0.91  0.50 - 1.35 mg/dL   Calcium 8.3 (*) 8.4 - 10.5 mg/dL   GFR calc non Af Amer 83 (*) >90 mL/min   GFR calc Af Amer >90  >90 mL/min   Comment: (NOTE)     The eGFR has been calculated using the CKD EPI equation.     This calculation has  not been validated in all clinical situations.     eGFR's persistently <90 mL/min signify possible Chronic Kidney     Disease.  CBC     Status: Abnormal   Collection Time    11/05/13  2:44 AM      Result Value Ref Range   WBC 13.2 (*) 4.0 - 10.5 K/uL   RBC 2.98 (*) 4.22 - 5.81 MIL/uL  Hemoglobin 9.7 (*) 13.0 - 17.0 g/dL   HCT 28.5 (*) 39.0 - 52.0 %   MCV 95.6  78.0 - 100.0 fL   MCH 32.6  26.0 - 34.0 pg   MCHC 34.0  30.0 - 36.0 g/dL   RDW 12.5  11.5 - 15.5 %   Platelets 333  150 - 400 K/uL  GLUCOSE, CAPILLARY     Status: Abnormal   Collection Time    11/05/13  3:07 AM      Result Value Ref Range   Glucose-Capillary 137 (*) 70 - 99 mg/dL   Comment 1 Documented in Chart     Comment 2 Notify RN    GLUCOSE, CAPILLARY     Status: Abnormal   Collection Time    11/05/13  8:57 AM      Result Value Ref Range   Glucose-Capillary 179 (*) 70 - 99 mg/dL  GLUCOSE, CAPILLARY     Status: Abnormal   Collection Time    11/05/13 12:49 PM      Result Value Ref Range   Glucose-Capillary 190 (*) 70 - 99 mg/dL  GLUCOSE, CAPILLARY     Status: Abnormal   Collection Time    11/05/13  7:57 PM      Result Value Ref Range   Glucose-Capillary 187 (*) 70 - 99 mg/dL   Comment 1 Documented in Chart     Comment 2 Notify RN    GLUCOSE, CAPILLARY     Status: Abnormal   Collection Time    11/06/13  6:30 AM      Result Value Ref Range   Glucose-Capillary 136 (*) 70 - 99 mg/dL  GLUCOSE, CAPILLARY     Status: Abnormal   Collection Time    11/06/13 11:25 AM      Result Value Ref Range   Glucose-Capillary 250 (*) 70 - 99 mg/dL    MICRO: 5/30 blood cx ngtd 5/30 or cx MSSA 5/30 tissue cx MSSA IMAGING: Dg Chest Port 1 View  11/05/2013   CLINICAL DATA:  Atelectasis  EXAM: PORTABLE CHEST - 1 VIEW  COMPARISON:  Nov 03, 2013  FINDINGS: There is patchy atelectasis/consolidation in the left lower lobe with small left effusion. There is less consolidation compared to recent prior study. Right lung is clear.  Heart is mildly enlarged with normal pulmonary vascularity. Patient is status post coronary artery bypass grafting. There are now skin staples along the left medial upper hemithorax.  IMPRESSION: Less consolidation in the left base compared to recent prior study. Small left effusion persists. There is some patchy atelectasis/ consolidation remaining in the left lower lobe. Right lung remains clear. No change in cardiac silhouette.   Electronically Signed   By: Lowella Grip M.D.   On: 11/05/2013 07:53    Assessment/Plan:  73yo M with 2 wk history of chills, nightsweats, back and neck pain presented with right arm parestheis and urinary incontinence found to have MSSA cervical -thoracic epidural abscess s/p laminectomy and evacuation/wash out on 5/30, placed on empiric antibiotics  - recommend to treat with narrow regimen of nafcillin 2gm IV 14hr and discontinue vanco, ceftaz, and metronidazole - pneumonia is likely due to MSSA. Recommend to follow with cxr to see that the consolidation is not worsening - can place picc line tonight since no evidence of bacteremia - will treat for 6 wks. If he develops aki or rash from nafcillin, will switch to cefazolin 2gm IV q8hr.

## 2013-11-06 NOTE — Progress Notes (Signed)
Patient's IV leaking and so discontinued, this RN plus a second RN did not see good placement options for peripheral IV. Called Dr Christella Noa who gave orders for PICC insertion. Orders placed in epic and IV team paged.

## 2013-11-06 NOTE — Progress Notes (Signed)
Occupational Therapy Treatment Patient Details Name: Michael Morton MRN: [id number] DOB: 1941/01/20 Today's Date: 11/06/2013    History of present illness Pt is a 73 yo male whom over the last 2 weeks has complained of increasingly severe pain in his shoulders, arms, back, and neck. He has been lethargic, fatigued, and weak. He has made multiple visits to the ER in the last week. He was found to have a pleural effusion, and a consolidated left lower lobe.The effusion was not tapped, he was given antibiotics to treat an assumed pneumonia. Finally today when he returned to the ED an MRI of the Cspine was ordered which revealed an epidural fluid collection consistent with an epidural abcess. On 5/30 pt s/p POSTERIOR CERVICAL AND THORACIC EVACUATION OF EPIDURAL ABSCESS, C6 AND T4 LAMINECTOMIES.   OT comments  Pt progressing towards acute OT goals. Reviewed technique for LB bathing and dressing with cervical precautions. Pt able to raise legs high enough to access feet in sitting. Practiced transfer to/from shower seat with pt performing min guard. Pt stated his wife will be able to help him with ADLs as needed.   Follow Up Recommendations  No OT follow up;Supervision - Intermittent    Equipment Recommendations  None recommended by OT    Recommendations for Other Services      Precautions / Restrictions Precautions Precautions: Cervical Precaution Comments: Reviewed cervical precautions with pt.       Mobility Bed Mobility Overal bed mobility: Modified Independent Bed Mobility: Rolling;Sidelying to Sit Rolling: Modified independent (Device/Increase time) Sidelying to sit: Modified independent (Device/Increase time)       General bed mobility comments: used bed rail  Transfers Overall transfer level: Needs assistance Equipment used: None Transfers: Sit to/from Stand Sit to Stand: Min guard              Balance Overall balance assessment: Needs assistance Sitting-balance  support: Feet supported;No upper extremity supported Sitting balance-Leahy Scale: Good     Standing balance support: No upper extremity supported;During functional activity Standing balance-Leahy Scale: Fair                     ADL Overall ADL's : Needs assistance/impaired                                     Functional mobility during ADLs: Min guard General ADL Comments: Pt stated his wife can help him with bathing/dressing. Reviewed technique for LB bathing and dressing with cervical precautions. Pt able to raise legs high enough to access feet in sitting. Practiced transfer to/from shower seat.      Vision                     Perception     Praxis      Cognition   Behavior During Therapy: Flat affect Overall Cognitive Status: Within Functional Limits for tasks assessed       Memory: Decreased recall of precautions               Extremity/Trunk Assessment               Exercises     Shoulder Instructions       General Comments      Pertinent Vitals/ Pain       Mild ongoing pain in lower back, increased activity during session. Repositioned to recliner at end of session.  Home  Living                                          Prior Functioning/Environment              Frequency Min 2X/week     Progress Toward Goals  OT Goals(current goals can now be found in the care plan section)  Progress towards OT goals: Progressing toward goals  Acute Rehab OT Goals Time For Goal Achievement: 11/12/13 Potential to Achieve Goals: Good ADL Goals Pt Will Perform Grooming: with modified independence;standing Pt Will Perform Lower Body Bathing: with modified independence;sit to/from stand Pt Will Perform Lower Body Dressing: with modified independence;sit to/from stand Pt Will Transfer to Toilet: with modified independence;ambulating;regular height toilet;grab bars Pt Will Perform Toileting - Clothing  Manipulation and hygiene: with modified independence;sit to/from stand Pt Will Perform Tub/Shower Transfer: Shower transfer;with modified independence;ambulating;shower seat;grab bars Additional ADL Goal #1: Pt will be independent with cervical precautions  Plan Discharge plan remains appropriate    Co-evaluation                 End of Session Equipment Utilized During Treatment: Gait belt   Activity Tolerance Patient tolerated treatment well   Patient Left in chair;with call bell/phone within reach;with chair alarm set   Nurse Communication Other (comment) (IV leakage)        Time: 2542-7062 OT Time Calculation (min): 20 min  Charges: OT General Charges $OT Visit: 1 Procedure OT Treatments $Self Care/Home Management : 8-22 mins  Hortencia Pilar 11/06/2013, 1:55 PM

## 2013-11-06 NOTE — Progress Notes (Signed)
Peripherally Inserted Central Catheter/Midline Placement  The IV Nurse has discussed with the patient and/or persons authorized to consent for the patient, the purpose of this procedure and the potential benefits and risks involved with this procedure.  The benefits include less needle sticks, lab draws from the catheter and patient may be discharged home with the catheter.  Risks include, but not limited to, infection, bleeding, blood clot (thrombus formation), and puncture of an artery; nerve damage and irregular heat beat.  Alternatives to this procedure were also discussed.  PICC/Midline Placement Documentation        Michael Morton 11/06/2013, 9:43 PM

## 2013-11-06 NOTE — Progress Notes (Signed)
Physical Therapy Treatment Patient Details Name: Michael Morton MRN: [id number] DOB: September 26, 1940 Today's Date: 11/06/2013    History of Present Illness Pt is a 73 yo male whom over the last 2 weeks has complained of increasingly severe pain in his shoulders, arms, back, and neck. He has been lethargic, fatigued, and weak. He has made multiple visits to the ER in the last week. He was found to have a pleural effusion, and a consolidated left lower lobe.The effusion was not tapped, he was given antibiotics to treat an assumed pneumonia. Finally today when he returned to the ED an MRI of the Cspine was ordered which revealed an epidural fluid collection consistent with an epidural abcess. On 5/30 pt s/p POSTERIOR CERVICAL AND THORACIC EVACUATION OF EPIDURAL ABSCESS, C6 AND T4 LAMINECTOMIES.    PT Comments    Pt progressing very well. Pt to be safe to d/c home with spouse once medically stable. Acute PT to con't to follow to address higher level balance.   Follow Up Recommendations  No PT follow up;Supervision/Assistance - 24 hour     Equipment Recommendations  None recommended by PT    Recommendations for Other Services       Precautions / Restrictions Precautions Precautions: Cervical Precaution Comments: Reviewed cervical precautions with pt. Restrictions Weight Bearing Restrictions: No    Mobility  Bed Mobility Overal bed mobility: Modified Independent Bed Mobility: Rolling;Sidelying to Sit Rolling: Modified independent (Device/Increase time) Sidelying to sit: Modified independent (Device/Increase time)       General bed mobility comments: used bed rail  Transfers Overall transfer level: Needs assistance Equipment used: None Transfers: Sit to/from Stand Sit to Stand: Min guard         General transfer comment: min guard due to mild instability  Ambulation/Gait Ambulation/Gait assistance: Min guard Ambulation Distance (Feet): 600 Feet Assistive device: None Gait  Pattern/deviations: Step-through pattern Gait velocity: wfl   General Gait Details: occasionaly vearing L/R but able to maintain balance, no obvious episodes of LOB   Stairs Stairs: Yes Stairs assistance: Min guard Stair Management: No rails;Alternating pattern;Forwards Number of Stairs: 2    Wheelchair Mobility    Modified Rankin (Stroke Patients Only)       Balance                                    Cognition Arousal/Alertness: Awake/alert Behavior During Therapy: WFL for tasks assessed/performed Overall Cognitive Status: Within Functional Limits for tasks assessed                      Exercises      General Comments        Pertinent Vitals/Pain Mild surgical discomfot    Home Living                      Prior Function            PT Goals (current goals can now be found in the care plan section) Progress towards PT goals: Progressing toward goals    Frequency  Min 3X/week    PT Plan Frequency needs to be updated;Current plan remains appropriate    Co-evaluation             End of Session Equipment Utilized During Treatment: Gait belt Activity Tolerance: Patient tolerated treatment well Patient left: in chair;with call bell/phone within reach;with nursing/sitter in room;with chair alarm set  Time: 8416-6063 PT Time Calculation (min): 19 min  Charges:  $Gait Training: 8-22 mins                    G Codes:      Koleen Distance Paz Fuentes 11/07/13, 8:30 AM  Kittie Plater, PT, DPT Pager #: [phone] Office #: [phone]

## 2013-11-06 NOTE — Progress Notes (Signed)
Spoke to IV team, and they said someone should be available to start his PICC line before 10 pm tonight

## 2013-11-07 ENCOUNTER — Inpatient Hospital Stay (HOSPITAL_COMMUNITY): Payer: Medicare Other

## 2013-11-07 DIAGNOSIS — A4901 Methicillin susceptible Staphylococcus aureus infection, unspecified site: Secondary | ICD-10-CM

## 2013-11-07 DIAGNOSIS — J189 Pneumonia, unspecified organism: Secondary | ICD-10-CM

## 2013-11-07 LAB — CBC WITH DIFFERENTIAL/PLATELET
BASOS PCT: 0 % (ref 0–1)
Basophils Absolute: 0 10*3/uL (ref 0.0–0.1)
Eosinophils Absolute: 0.2 10*3/uL (ref 0.0–0.7)
Eosinophils Relative: 2 % (ref 0–5)
HCT: 32 % — ABNORMAL LOW (ref 39.0–52.0)
Hemoglobin: 10.9 g/dL — ABNORMAL LOW (ref 13.0–17.0)
LYMPHS ABS: 0.8 10*3/uL (ref 0.7–4.0)
Lymphocytes Relative: 8 % — ABNORMAL LOW (ref 12–46)
MCH: 32.6 pg (ref 26.0–34.0)
MCHC: 34.1 g/dL (ref 30.0–36.0)
MCV: 95.8 fL (ref 78.0–100.0)
Monocytes Absolute: 0.8 10*3/uL (ref 0.1–1.0)
Monocytes Relative: 8 % (ref 3–12)
Neutro Abs: 7.6 10*3/uL (ref 1.7–7.7)
Neutrophils Relative %: 82 % — ABNORMAL HIGH (ref 43–77)
PLATELETS: 469 10*3/uL — AB (ref 150–400)
RBC: 3.34 MIL/uL — AB (ref 4.22–5.81)
RDW: 12.6 % (ref 11.5–15.5)
WBC: 9.3 10*3/uL (ref 4.0–10.5)

## 2013-11-07 LAB — GLUCOSE, CAPILLARY
Glucose-Capillary: 181 mg/dL — ABNORMAL HIGH (ref 70–99)
Glucose-Capillary: 213 mg/dL — ABNORMAL HIGH (ref 70–99)
Glucose-Capillary: 237 mg/dL — ABNORMAL HIGH (ref 70–99)
Glucose-Capillary: 158 mg/dL — ABNORMAL HIGH (ref 70–99)
Glucose-Capillary: 172 mg/dL — ABNORMAL HIGH (ref 70–99)

## 2013-11-07 LAB — SEDIMENTATION RATE: SED RATE: 90 mm/h — AB (ref 0–16)

## 2013-11-07 LAB — C-REACTIVE PROTEIN: CRP: 15.6 mg/dL — AB (ref <0.60)

## 2013-11-07 MED ORDER — CEFAZOLIN SODIUM-DEXTROSE 2-3 GM-% IV SOLR
2.0000 g | Freq: Three times a day (TID) | INTRAVENOUS | Status: DC
  Administered 2013-11-07 – 2013-11-13 (×17): 2 g via INTRAVENOUS
  Filled 2013-11-07 (×19): qty 50

## 2013-11-07 MED ORDER — MAGNESIUM CITRATE PO SOLN
1.0000 | Freq: Once | ORAL | Status: AC
  Administered 2013-11-07: 1 via ORAL
  Filled 2013-11-07: qty 296

## 2013-11-07 NOTE — Care Management Note (Signed)
    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 Lives at home with wife.     Action/Plan:   Will follow for discharge needs pending PT/OT evals an physician orders. Anticipate IV ATB therapy at home.   Anticipated DC Date:  11/13/2013   Anticipated DC Plan:  [place]  CM consult      Choice offered to / List presented to:  C-1 Patient        [place] arranged  HH-1 RN  IV Antibiotics      [place].   Status of service:  Completed, signed off Medicare Important Message given?  YES (If response is "NO", the following Medicare IM given date fields will be blank) Date Medicare IM given:  11/03/2013 Date Additional Medicare IM given:  11/07/2013  Discharge Disposition:  [place]  Per UR Regulation:  Reviewed for med. necessity/level of care/duration of stay  If discussed at [place] of Stay Meetings, dates discussed:    Comments:  11/13/13 [place], MSN, CM- Patient awaiting new PICC line placement for discharge home today on IV ATB. IV Ancef orders renewed per Dr Baxter Flattery with ID.  Additional Medicare IM letter was provided to patient.   11/07/13 [place], MSN, CM- Met with patient and wife at bedside to discuss home health antibiotic needs at discharge.  Patient and wife have chosen [facility] to provide services.  Pam with [facility] was notified and has accepted the referral. Patient's wife Arbie Cookey would like to be notified of time to meet with [facility] to begin teaching. XYVO#[phone], home [phone]. Await completion of home health orders from Infectious disease.  Medicare IM letter  provided.

## 2013-11-07 NOTE — Progress Notes (Addendum)
[place] for Infectious Disease    Date of Admission:  11/03/2013   Total days of antibiotics 5        Day 2 nafcillin           ID: Michael Morton is a 73 y.o. 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 Has not caused him problems of late but not healed. He notices that his tongue feels numb, "in the way"  Medications:  . acyclovir  800 mg Oral Daily  . amLODipine  5 mg Oral Daily  . aspirin EC  81 mg Oral Daily  . atorvastatin  20 mg Oral q1800  . glyBURIDE  5 mg Oral BID WC  . insulin aspart  0-15 Units Subcutaneous TID WC  . metoprolol  50 mg Oral BID  . nafcillin IV  2 g Intravenous 6 times per day  . senna  1 tablet Oral BID  . sertraline  50 mg Oral Daily  . sodium chloride  3 mL Intravenous Q12H  . tamsulosin  0.4 mg Oral Daily  . traZODone  100 mg Oral QHS  . venlafaxine  25 mg Oral TID    Objective: Vital signs in last 24 hours: Temp:  [98.1 F (36.7 C)-99 F (37.2 C)] 98.1 F (36.7 C) (06/03 0955) Pulse Rate:  [69-84] 83 (06/03 0955) Resp:  [18-20] 18 (06/03 0955) BP: (123-148)/(51-66) 124/56 mmHg (06/03 0955) SpO2:  [93 %-99 %] 96 % (06/03 0955) Physical Exam  Constitutional: He is oriented to person, place, and time. He appears well-developed and well-nourished. No distress.  HENT:  Mouth/Throat: Oropharynx is clear and moist. No oropharyngeal exudate.  Cardiovascular: Normal rate, regular rhythm and normal heart sounds. Exam reveals no gallop and no friction rub.  No murmur heard.  Pulmonary/Chest: Effort normal and breath sounds normal. No respiratory distress. He has no wheezes.  Abdominal:  Soft. Bowel sounds are normal. He exhibits no distension. There is no tenderness.  Lymphadenopathy:  He has no cervical adenopathy.  Neurological: He is alert and oriented to person, place, and time.  Skin: Skin is warm and dry. No rash noted. No erythema. Right big toe - diabetic ulcer 1.2cm round, no fluctuance or drainage nor erythema. Surgical dressing in place on back Ext: right picc line c/d/i Psychiatric: He has a normal mood and affect. His behavior is normal.     Lab Results  Recent Labs  11/05/13 0244  WBC 13.2*  HGB 9.7*  HCT 28.5*  NA 135*  K 3.8  CL 99  CO2 24  BUN 16  CREATININE 0.91   Microbiology: 5/30 tissue cx :MSSA  Studies/Results: cxr 6/1IMPRESSION:  Less consolidation in the left base compared to recent prior study.  Small left effusion persists. There is some patchy atelectasis/  consolidation remaining in the left lower lobe. Right lung remains  clear. No change in cardiac silhouette.   Assessment/Plan: MSSA cervical-thoracic epidural abscess = recommend to treat for 6 wk with iv antibiotics. Currently on nafcillin 2gm IF Q6hr. We will switch to cefazolin 2gm IV Q 8hr starting tomorrow for ease of antibiotic  Administration at home. Use 11/04/13 as  Day 1 of antibiotics, end date of July 11th. Will check labs today plus sed rate and crp.  Consider checking cxr tomorrow to ensure no loculation of fluid to suggest need for drainage  We will place order for home health to teach infusion, do weekly dressing changes, weekly labs of cbc, bmp per protocol.  C/o tongue numbness = wondering if he may have sustained nerve injury from cervical laminectomy that could contribute to symptoms. Does not have any motor weakness on exam. No difficulty swallowing per patient report. Will defer to Dr. Christella Noa.  Will arrange follow up in ID clinic in 2-3 wk as well as in 6 wks.  [facility] for Infectious Diseases Cell: [phone] Pager:  [phone]  11/07/2013, 10:18 AM

## 2013-11-07 NOTE — Progress Notes (Signed)
Patient ID: Michael Morton, male   DOB: 1940-07-26, 73 y.o.   MRN: [id number] BP 164/65  Pulse 84  Temp(Src) 98.6 F (37 C) (Oral)  Resp 20  Ht 6\' 2"  (1.88 m)  Wt 90.3 kg (199 lb 1.2 oz)  BMI 25.55 kg/m2  SpO2 94% Alert and oriented x 4 Speech is clear and fluent Moving all extremities well Tongue numb, not due to cervical surgery. Believe this well resolve in time.  Will give laxative.  Doing well overall.

## 2013-11-07 NOTE — Progress Notes (Signed)
Pt complaining of numbness of the tongue. Will pass on to MD and will continue to monitor.

## 2013-11-08 LAB — COMPREHENSIVE METABOLIC PANEL
ALT: 32 U/L (ref 0–53)
AST: 20 U/L (ref 0–37)
Albumin: 2 g/dL — ABNORMAL LOW (ref 3.5–5.2)
Alkaline Phosphatase: 109 U/L (ref 39–117)
BUN: 14 mg/dL (ref 6–23)
CHLORIDE: 101 meq/L (ref 96–112)
CO2: 26 mEq/L (ref 19–32)
Calcium: 8.2 mg/dL — ABNORMAL LOW (ref 8.4–10.5)
Creatinine, Ser: 0.89 mg/dL (ref 0.50–1.35)
GFR calc Af Amer: >90 mL/min (ref >90)
GFR, EST NON AFRICAN AMERICAN: 84 mL/min — AB (ref >90)
Glucose, Bld: 179 mg/dL — ABNORMAL HIGH (ref 70–99)
Potassium: 3.5 mEq/L — ABNORMAL LOW (ref 3.7–5.3)
SODIUM: 137 meq/L (ref 137–147)
Total Bilirubin: 0.3 mg/dL (ref 0.3–1.2)
Total Protein: 6.4 g/dL (ref 6.0–8.3)

## 2013-11-08 LAB — ANAEROBIC CULTURE

## 2013-11-08 LAB — GLUCOSE, CAPILLARY
GLUCOSE-CAPILLARY: 200 mg/dL — AB (ref 70–99)
Glucose-Capillary: 157 mg/dL — ABNORMAL HIGH (ref 70–99)
Glucose-Capillary: 153 mg/dL — ABNORMAL HIGH (ref 70–99)

## 2013-11-08 NOTE — Progress Notes (Signed)
Physical Therapy Treatment Patient Details Name: Michael Morton MRN: [id number] DOB: Dec 19, 1940 Today's Date: 11/08/2013    History of Present Illness Pt is a 73 yo male whom over the last 2 weeks has complained of increasingly severe pain in his shoulders, arms, back, and neck. He has been lethargic, fatigued, and weak. He has made multiple visits to the ER in the last week. He was found to have a pleural effusion, and a consolidated left lower lobe.The effusion was not tapped, he was given antibiotics to treat an assumed pneumonia. Finally today when he returned to the ED an MRI of the Cspine was ordered which revealed an epidural fluid collection consistent with an epidural abcess. On 5/30 pt s/p POSTERIOR CERVICAL AND THORACIC EVACUATION OF EPIDURAL ABSCESS, C6 AND T4 LAMINECTOMIES.    PT Comments    Pt progressing well with mobility. Pt at supervision level for gait at this time. Recommend pt mobilize unit as tolerated with nursing. Pt safe from mobility standpoint to D/C when medically ready. Will cont to see pt to increase mobility and review cervical precautions.   Follow Up Recommendations  No PT follow up;Supervision/Assistance - 24 hour     Equipment Recommendations  None recommended by PT    Recommendations for Other Services       Precautions / Restrictions Precautions Precautions: Cervical Precaution Comments: reviewed cervical precautions throughout session  Restrictions Weight Bearing Restrictions: No    Mobility  Bed Mobility               General bed mobility comments: not addressed; pt in recliner; reviewed technique verbally  Transfers Overall transfer level: Modified independent Equipment used: None Transfers: Sit to/from Stand Sit to Stand: Modified independent (Device/Increase time)         General transfer comment: use of handrails  Ambulation/Gait Ambulation/Gait assistance: Supervision Ambulation Distance (Feet): 800 Feet Assistive device:  None Gait Pattern/deviations: Step-through pattern;Ataxic;Trunk flexed;Narrow base of support;Decreased stride length (ataxic like at times with Rt LE ) Gait velocity: wfl Gait velocity interpretation: at or above normal speed for age/gender General Gait Details: min cues for safety; supervision for safety; encouraged to ambulate as tolerated with nursing; pt fatigued    Stairs         General stair comments: denies need to practice steps this session; reviewed technique verbally  Wheelchair Mobility    Modified Rankin (Stroke Patients Only)       Balance Overall balance assessment: Needs assistance Sitting-balance support: Feet supported;No upper extremity supported Sitting balance-Leahy Scale: Good     Standing balance support: No upper extremity supported;During functional activity Standing balance-Leahy Scale: Fair                      Cognition Arousal/Alertness: Awake/alert Behavior During Therapy: Flat affect Overall Cognitive Status: Within Functional Limits for tasks assessed       Memory: Decreased recall of precautions              Exercises General Exercises - Lower Extremity Long Arc Quad: AROM;Strengthening;Both;10 reps;Seated    General Comments        Pertinent Vitals/Pain 5/10; soreness     Home Living                      Prior Function            PT Goals (current goals can now be found in the care plan section) Acute Rehab PT Goals Patient Stated Goal:  to walk more and get home to my buddies  PT Goal Formulation: With patient Time For Goal Achievement: 11/11/13 Potential to Achieve Goals: Good Progress towards PT goals: Progressing toward goals    Frequency  Min 3X/week    PT Plan Current plan remains appropriate    Co-evaluation             End of Session Equipment Utilized During Treatment: Gait belt Activity Tolerance: Patient tolerated treatment well Patient left: in chair;with call  bell/phone within reach;with chair alarm set     Time: 4580-9983 PT Time Calculation (min): 15 min  Charges:  $Gait Training: 8-22 mins                    G CodesKennis Carina [place], [place]  [phone] 11/08/2013, 8:54 AM

## 2013-11-08 NOTE — Progress Notes (Signed)
Patient ID: Michael Morton, male   DOB: 1941-05-24, 74 y.o.   MRN: [id number] BP 163/67  Pulse 81  Temp(Src) 98.9 F (37.2 C) (Oral)  Resp 18  Ht 6\' 2"  (1.88 m)  Wt 90.3 kg (199 lb 1.2 oz)  BMI 25.55 kg/m2  SpO2 98% Blood cultures sent on admission positive for staph consistent with SEA. He has been on appropriate antibiotics since the Or. Will be seen in followup by ID. Once home health is set with home antibiotics he will be able to go home. Neuro stable.

## 2013-11-08 NOTE — Progress Notes (Signed)
[facility] for Infectious Disease    Date of Admission:  11/03/2013   Total days of antibiotics 6        Day 2 cefazolin           ID: Michael Morton is a 73 y.o. 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  Medications:  . amLODipine  5 mg Oral Daily  . aspirin EC  81 mg Oral Daily  . atorvastatin  20 mg Oral q1800  .  ceFAZolin (ANCEF) IV  2 g Intravenous 3 times per day  . glyBURIDE  5 mg Oral BID WC  . insulin aspart  0-15 Units Subcutaneous TID WC  . metoprolol  50 mg Oral BID  . senna  1 tablet Oral BID  . sertraline  50 mg Oral Daily  . sodium chloride  3 mL Intravenous Q12H  . tamsulosin  0.4 mg Oral Daily  . traZODone  100 mg Oral QHS  . venlafaxine  25 mg Oral TID    Objective: Vital signs in last 24 hours: Temp:  [98 F (36.7 C)-98.9 F (37.2 C)] 98.2 F (36.8 C) (06/04 1359) Pulse Rate:  [73-86] 73 (06/04 1359) Resp:  [16-20] 16 (06/04 1359) BP: (118-164)/(54-75) 118/54 mmHg (06/04 1359) SpO2:  [94 %-98 %] 97 % (06/04 1359) Physical Exam  Constitutional: He is oriented to person, place, and time. He appears well-developed and well-nourished. No distress.  HENT:  Mouth/Throat: Oropharynx is clear and moist. No oropharyngeal exudate. No thrush Skin: Skin is warm and dry. No rash noted. No erythema. Right big toe - diabetic ulcer 1.2cm round, no fluctuance or drainage nor erythema. Surgical incision site is healing well with staples in place. No erythema or drainage noted. Ext: right picc line c/d/i Psychiatric: He has a normal mood and affect. His behavior is normal.     Lab Results  Recent Labs  11/07/13 1120 11/08/13 0449  WBC 9.3  --   HGB 10.9*  --   HCT 32.0*  --   NA  --  137  K  --  3.5*  CL  --  101  CO2  --  26  BUN  --   14  CREATININE  --  0.89   Microbiology: 5/30 tissue cx :MSSA  Studies/Results: cxr 6/1IMPRESSION:  Less consolidation in the left base compared to recent prior study.  Small left effusion persists. There is some patchy atelectasis/  consolidation remaining in the left lower lobe. Right lung remains  clear. No change in cardiac silhouette.   Assessment/Plan: MSSA cervical-thoracic epidural abscess = recommend to treat for 6 wk with iv antibiotics. Currently on nafcillin 2gm IF Q6hr. We will switch to cefazolin 2gm IV Q 8hr starting tomorrow for ease of antibiotic  Administration at home. Use 11/04/13 as Day 1 of antibiotics, end date of July 11th. Will check labs today plus sed rate and crp.   Pneumonia = cxr appears improved. Continue with cefazolin  We will place order for home health to teach infusion, do weekly dressing changes, weekly labs of cbc, bmp per protocol.  Will arrange follow up in ID clinic in 2-3 wk as well as in 6 wks. Will sign off. If questions, please call.  [facility] for Infectious Diseases Cell: [phone] Pager: [phone]  11/08/2013, 4:44 PM

## 2013-11-08 NOTE — Progress Notes (Signed)
Occupational Therapy Treatment Patient Details Name: Michael Morton MRN: [id number] DOB: 1940-12-19 Today's Date: 11/08/2013    History of present illness Pt is a 73 y.o. Male s/p Posterior Cervical and Thoracic Evacuation of epidural abscess, C6 to T4 laminectomies on 11/03/13.   OT comments  Pt seen today for ADLs, functional mobility, and review of cervical precautions. Pt continues to require supervision during LB ADLs to maintain cervical precautions. However, pt now with functional mobility at Supervision level. OT will continue to follow up with pt for precaution education and to progress to Mod independent level.    Follow Up Recommendations  No OT follow up;Supervision - Intermittent    Equipment Recommendations  None recommended by OT       Precautions / Restrictions Precautions Precautions: Cervical Precaution Comments: reviewed cervical precautions throughout session  Restrictions Weight Bearing Restrictions: No       Mobility Bed Mobility Overal bed mobility: Modified Independent Bed Mobility: Sidelying to Sit   Sidelying to sit: Modified independent (Device/Increase time)          Transfers Overall transfer level: Modified independent Equipment used: None Transfers: Sit to/from Stand Sit to Stand: Modified independent (Device/Increase time)         General transfer comment: use of handrails    Balance Overall balance assessment: Needs assistance Sitting-balance support: Feet supported;No upper extremity supported Sitting balance-Leahy Scale: Good     Standing balance support: No upper extremity supported;During functional activity Standing balance-Leahy Scale: Fair                     ADL Overall ADL's : Needs assistance/impaired     Grooming: Supervision/safety;Standing       Lower Body Bathing: Supervison/ safety;Sit to/from stand (cues for cervical precautions)       Lower Body Dressing: Supervision/safety;Sit to/from stand;Set  up (cues for cervical precautions)   Toilet Transfer: Supervision/safety;Ambulation           Functional mobility during ADLs: Supervision/safety General ADL Comments: Pt moving well and overall at supervision level and requires cueing for cervical precautions. Pt appears more steady on feet during functional mobility and requires Supervision.                 Cognition  Arousal/Alertness: Awake/Alert Behavior During Therapy: Flat affect Overall Cognitive Status: Within Functional Limits for tasks assessed       Memory: Decreased recall of precautions                            Pertinent Vitals/ Pain       No c/o pain.         Frequency Min 2X/week     Progress Toward Goals  OT Goals(current goals can now be found in the care plan section)  Progress towards OT goals: Progressing toward goals     Plan Discharge plan remains appropriate       End of Session Equipment Utilized During Treatment: Gait belt   Activity Tolerance Patient tolerated treatment well   Patient Left in chair;with call bell/phone within reach;with chair alarm set           Time: 1512-1530 OT Time Calculation (min): 18 min  Charges: OT General Charges $OT Visit: 1 Procedure OT Treatments $Self Care/Home Management : 8-22 mins  Juluis Rainier [phone] 11/08/2013, 6:13 PM

## 2013-11-09 ENCOUNTER — Encounter (HOSPITAL_COMMUNITY)
Admission: EM | Disposition: A | Discharge status: Home Health | Payer: Self-pay | Source: Home / Self Care | Attending: Neurosurgery

## 2013-11-09 DIAGNOSIS — R7881 Bacteremia: Secondary | ICD-10-CM

## 2013-11-09 LAB — GLUCOSE, CAPILLARY
GLUCOSE-CAPILLARY: 175 mg/dL — AB (ref 70–99)
Glucose-Capillary: 94 mg/dL (ref 70–99)
Glucose-Capillary: 169 mg/dL — ABNORMAL HIGH (ref 70–99)
Glucose-Capillary: 224 mg/dL — ABNORMAL HIGH (ref 70–99)
Glucose-Capillary: 174 mg/dL — ABNORMAL HIGH (ref 70–99)

## 2013-11-09 LAB — CULTURE, BLOOD (ROUTINE X 2): Culture: NO GROWTH

## 2013-11-09 SURGERY — CANCELLED PROCEDURE

## 2013-11-09 MED ORDER — SODIUM CHLORIDE 0.9 % IV SOLN: INTRAVENOUS | Status: DC

## 2013-11-09 NOTE — Progress Notes (Signed)
Patient somehow was confused on NPO order and had water plus some food prior to 3pm. Thus his TEE was cancelled.

## 2013-11-09 NOTE — Progress Notes (Signed)
Patient ID: Michael Morton, male   DOB: 1940-12-02, 73 y.o.   MRN: [id number] BP 160/69  Pulse 87  Temp(Src) 99.6 F (37.6 C) (Oral)  Resp 18  Ht 6\' 2"  (1.88 m)  Wt 90.3 kg (199 lb 1.2 oz)  BMI 25.55 kg/m2  SpO2 94% Alert and oriented x 4, speech is clear and fluent Moving all extremities well Being evaluated for possible endocarditis. There are no active neurosurgical issues, ready for discharge when ID finishes their workup.

## 2013-11-09 NOTE — Progress Notes (Addendum)
[place] for Infectious Disease    Date of Admission:  11/03/2013   Total days of antibiotics 6        Day 2 cefazolin           ID: Michael Morton is a 73 y.o. 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  * blood cx positive for MSSA from admit likely related epidural abscess, we  Will need to do work up/rule out  endocarditis  Medications:  . amLODipine  5 mg Oral Daily  . aspirin EC  81 mg Oral Daily  . atorvastatin  20 mg Oral q1800  .  ceFAZolin (ANCEF) IV  2 g Intravenous 3 times per day  . glyBURIDE  5 mg Oral BID WC  . insulin aspart  0-15 Units Subcutaneous TID WC  . metoprolol  50 mg Oral BID  . senna  1 tablet Oral BID  . sertraline  50 mg Oral Daily  . sodium chloride  3 mL Intravenous Q12H  . tamsulosin  0.4 mg Oral Daily  . traZODone  100 mg Oral QHS  . venlafaxine  25 mg Oral TID    Objective: Vital signs in last 24 hours: Temp:  [97.8 F (36.6 C)-99.6 F (37.6 C)] 99.6 F (37.6 C) (06/05 0819) Pulse Rate:  [73-86] 85 (06/05 0819) Resp:  [16-18] 18 (06/05 0819) BP: (118-163)/(54-82) 155/68 mmHg (06/05 0819) SpO2:  [95 %-98 %] 95 % (06/05 0819) Physical Exam  Constitutional: He is oriented to person, place, and time. He appears well-developed and well-nourished. No distress.  HENT:  Mouth/Throat: Oropharynx is clear and moist. No oropharyngeal exudate. No thrush Skin: Skin is warm and dry. No rash noted. No erythema. Right big toe - diabetic ulcer 1.2cm round, no fluctuance or drainage nor erythema. Surgical incision site is healing well with staples in place. No erythema or drainage noted. Ext: right picc line c/d/i Psychiatric: He has a normal mood and affect. His behavior is normal.     Lab Results  Recent Labs  11/07/13 1120  11/08/13 0449  WBC 9.3  --   HGB 10.9*  --   HCT 32.0*  --   NA  --  137  K  --  3.5*  CL  --  101  CO2  --  26  BUN  --  14  CREATININE  --  0.89   Microbiology: 5/30 tissue cx :MSSA  Studies/Results: cxr 6/1IMPRESSION:  Less consolidation in the left base compared to recent prior study.  Small left effusion persists. There is some patchy atelectasis/  consolidation remaining in the left lower lobe. Right lung remains  clear. No change in cardiac silhouette.   Assessment/Plan:  MSSA Bacteremia = likely related to epidural abscess, however we can not exclude endocarditis since he has not had further blood cx drawn since admit. We will get repeat blood cultures and have spoken to echo lab to coordinate a TEE for this afternoon to unsure that he does not have endocarditis. Recommend to postpone discharge until we finish cardiac work up. This will not change the treatment course however, it would be useful to know that he does not have any further cardiac work up needed. Will keep him npo for now. Appreciate cardiology for doing tee this afternoon. If tee is clean,  patient can be discharged home  MSSA cervical-thoracic epidural abscess = recommend to treat for 6 wk with iv antibiotics. Currently on  cefazolin 2gm IV Q 8hr  for ease of antibiotic administration at home. Use 11/04/13 as Day 1 of antibiotics, end date of July 11th. Will check labs today plus sed rate and crp. We will place order for home health to teach infusion, do weekly dressing changes, weekly labs of cbc, bmp per protocol.  Pneumonia = cxr appears improved. Continue with cefazolin  Spent 30 min with greater than 50% in coordination of care for TEE and work up of endocarditis   [facility] for Infectious Diseases Cell: [phone] Pager: [phone]  11/09/2013, 9:36 AM

## 2013-11-09 NOTE — Progress Notes (Signed)
UR complete.  Markees Carns RN, MSN 

## 2013-11-09 NOTE — Progress Notes (Signed)
TEE was cancelled as patient informed nurse that he had eaten a push up popsicle 2 hours prior to being brought down to dept.  Dr. Stanford Breed notified.  Case cancelled

## 2013-11-09 NOTE — Progress Notes (Signed)
    [facility] has been requested to perform a transesophageal echocardiogram today for bacteremia, r/o endocarditis. After careful review of history and examination, the risks and benefits of transesophageal echocardiogram have been explained including risks of esophageal damage, perforation (1:10,000 risk), bleeding, pharyngeal hematoma as well as other potential complications associated with conscious sedation including aspiration, arrhythmia, respiratory failure and death. Alternatives to treatment were discussed, questions were answered. Patient is willing to proceed. Scheduled for 3pm with Dr. Stanford Breed.  Charlie Pitter, PA-C 11/09/2013 10:38 AM

## 2013-11-10 DIAGNOSIS — I519 Heart disease, unspecified: Secondary | ICD-10-CM

## 2013-11-10 LAB — GLUCOSE, CAPILLARY
GLUCOSE-CAPILLARY: 147 mg/dL — AB (ref 70–99)
GLUCOSE-CAPILLARY: 198 mg/dL — AB (ref 70–99)
GLUCOSE-CAPILLARY: 211 mg/dL — AB (ref 70–99)
Glucose-Capillary: 132 mg/dL — ABNORMAL HIGH (ref 70–99)

## 2013-11-10 LAB — CULTURE, BLOOD (ROUTINE X 2)

## 2013-11-10 NOTE — Progress Notes (Addendum)
[place] for Infectious Disease  Ancef day # 4  Subjective: No new complaints   Antibiotics:  Anti-infectives   Start     Dose/Rate Route Frequency Ordered Stop   11/07/13 1800  ceFAZolin (ANCEF) IVPB 2 g/50 mL premix     2 g 100 mL/hr over 30 Minutes Intravenous 3 times per day 11/07/13 1716     11/06/13 2100  ceFAZolin (ANCEF) IVPB 2 g/50 mL premix  Status:  Discontinued     2 g 100 mL/hr over 30 Minutes Intravenous 3 times per day 11/06/13 1532 11/06/13 1534   11/06/13 2000  nafcillin 2 g in dextrose 5 % 50 mL IVPB  Status:  Discontinued     2 g 100 mL/hr over 30 Minutes Intravenous 6 times per day 11/06/13 1534 11/07/13 1716   11/04/13 1000  acyclovir (ZOVIRAX) tablet 800 mg  Status:  Discontinued     800 mg Oral Daily 11/04/13 0249 11/08/13 1105   11/04/13 1000  vancomycin (VANCOCIN) 1,250 mg in sodium chloride 0.9 % 250 mL IVPB  Status:  Discontinued     1,250 mg 166.7 mL/hr over 90 Minutes Intravenous Every 12 hours 11/04/13 0309 11/06/13 1532   11/04/13 0400  metroNIDAZOLE (FLAGYL) IVPB 500 mg  Status:  Discontinued     500 mg 100 mL/hr over 60 Minutes Intravenous Every 8 hours 11/04/13 0302 11/06/13 1532   11/04/13 0315  cefTAZidime (FORTAZ) 2 g in dextrose 5 % 50 mL IVPB  Status:  Discontinued     2 g 100 mL/hr over 30 Minutes Intravenous 3 times per day 11/04/13 0309 11/06/13 1532   11/03/13 2018  vancomycin (VANCOCIN) 1 GM/200ML IVPB    Comments:  Haynes Bast   : cabinet override      11/03/13 2018 11/04/13 0829      Medications: Scheduled Meds: . amLODipine  5 mg Oral Daily  . aspirin EC  81 mg Oral Daily  . atorvastatin  20 mg Oral q1800  .  ceFAZolin (ANCEF) IV  2 g Intravenous 3 times per day  . glyBURIDE  5 mg Oral BID WC  . insulin aspart  0-15 Units Subcutaneous TID WC  . metoprolol  50 mg Oral BID  . senna  1 tablet Oral BID  . sertraline  50 mg Oral Daily  . sodium chloride  3 mL Intravenous Q12H  . tamsulosin  0.4 mg Oral Daily  .  traZODone  100 mg Oral QHS  . venlafaxine  25 mg Oral TID   Continuous Infusions: . sodium chloride 10 mL/hr at 11/05/13 1600  . sodium chloride    . sodium chloride     PRN Meds:.acetaminophen, alum & mag hydroxide-simeth, diazepam, HYDROcodone-acetaminophen, menthol-cetylpyridinium, morphine injection, nitroGLYCERIN, ondansetron (ZOFRAN) IV, phenol, polyethylene glycol, sodium chloride, sodium chloride    Objective: Weight change:   Intake/Output Summary (Last 24 hours) at 11/10/13 1546 Last data filed at 11/10/13 0630  Gross per 24 hour  Intake      0 ml  Output   1425 ml  Net  -1425 ml   Blood pressure 156/63, pulse 83, temperature 98.9 F (37.2 C), temperature source Oral, resp. rate 18, height 6\' 2"  (1.88 m), weight 199 lb 1.2 oz (90.3 kg), SpO2 96.00%. Temp:  [98 F (36.7 C)-99.6 F (37.6 C)] 98.9 F (37.2 C) (06/06 1541) Pulse Rate:  [76-88] 83 (06/06 1541) Resp:  [18-20] 18 (06/06 1541) BP: (126-160)/(63-77) 156/63 mmHg (06/06 1541) SpO2:  [94 %-99 %]  96 % (06/06 1541)  Physical Exam: General: Alert and awake, oriented x3, not in any acute distress.  CV: RRR no mgr Pulm  CTAB no w, r, r GI: soft nd, nt, +bs Neuro: nonfocal Skin: surgical site with staples is clean  CBC:  Recent Labs Lab 11/04/13 0339 11/05/13 0244 11/07/13 1120  HGB 10.5* 9.7* 10.9*  HCT 31.0* 28.5* 32.0*  PLT 310 333 469*     BMET  Recent Labs  11/08/13 0449  NA 137  K 3.5*  CL 101  CO2 26  GLUCOSE 179*  BUN 14  CREATININE 0.89  CALCIUM 8.2*     Liver Panel   Recent Labs  11/08/13 0449  PROT 6.4  ALBUMIN 2.0*  AST 20  ALT 32  ALKPHOS 109  BILITOT 0.3       Sedimentation Rate No results found for this basename: ESRSEDRATE,  in the last 72 hours C-Reactive Protein No results found for this basename: CRP,  in the last 72 hours  Micro Results: Recent Results (from the past 240 hour(s))  CULTURE, BLOOD (ROUTINE X 2)     Status: None   Collection Time     11/03/13 12:13 PM      Result Value Ref Range Status   Specimen Description BLOOD RIGHT ARM   Final   Special Requests BOTTLES DRAWN AEROBIC AND ANAEROBIC 5CC   Final   Culture  Setup Time     Final   Value: 11/03/2013 18:51     Performed at [facility]   Culture     Final   Value: NO GROWTH 5 DAYS     Performed at [facility]   Report Status 11/09/2013 FINAL   Final  CULTURE, BLOOD (ROUTINE X 2)     Status: None   Collection Time    11/03/13 12:19 PM      Result Value Ref Range Status   Specimen Description BLOOD LEFT ARM   Final   Special Requests BOTTLES DRAWN AEROBIC AND ANAEROBIC 5CCA   Final   Culture  Setup Time     Final   Value: 11/03/2013 18:51     Performed at [facility]   Culture     Final   Value: STAPHYLOCOCCUS AUREUS     Note: RIFAMPIN AND GENTAMICIN SHOULD NOT BE USED AS SINGLE DRUGS FOR TREATMENT OF STAPH INFECTIONS.     Note: Gram Stain Report Called to,Read Back By and Verified With: FRAN S@[id number]  ON [id number] BY [facility]     Performed at [facility]   Report Status 11/10/2013 FINAL   Final   Organism ID, Bacteria STAPHYLOCOCCUS AUREUS   Final  CULTURE, ROUTINE-ABSCESS     Status: None   Collection Time    11/03/13 10:09 PM      Result Value Ref Range Status   Specimen Description ABSCESS NECK   Final   Special Requests CERVICAL AND THORACIC EPIDURAL   Final   Gram Stain     Final   Value: MODERATE WBC PRESENT, PREDOMINANTLY PMN     RARE SQUAMOUS EPITHELIAL CELLS PRESENT     MODERATE GRAM POSITIVE COCCI IN PAIRS     Gram Stain Report Called to,Read Back By and Verified With: Gram Stain Report Called to,Read Back By and Verified With: [place] IN LAB @12363AM  BY [facility] 11/04/13     Performed at [facility]   Culture     Final   Value: [place]  Note: RIFAMPIN AND GENTAMICIN SHOULD NOT BE USED AS SINGLE DRUGS FOR TREATMENT OF STAPH INFECTIONS.     Performed at [facility]   Report  Status 11/06/2013 FINAL   Final   Organism ID, Bacteria STAPHYLOCOCCUS AUREUS   Final  ANAEROBIC CULTURE     Status: None   Collection Time    11/03/13 10:09 PM      Result Value Ref Range Status   Specimen Description ABSCESS NECK   Final   Special Requests CERVICAL AND THORACIC EPIDURAL   Final   Gram Stain     Final   Value: MODERATE WBC PRESENT, PREDOMINANTLY PMN     RARE SQUAMOUS EPITHELIAL CELLS PRESENT     MODERATE GRAM POSITIVE COCCI IN PAIRS     Gram Stain Report Called to,Read Back By and Verified With: Gram Stain Report Called to,Read Back By and Verified With: RASHEEZA IN LAB @ 1211AM BY [facility] 11/04/13     Performed at [facility]   Culture     Final   Value: NO ANAEROBES ISOLATED     Performed at [facility]   Report Status 11/08/2013 FINAL   Final  GRAM STAIN     Status: None   Collection Time    11/03/13 10:09 PM      Result Value Ref Range Status   Specimen Description ABSCESS NECK   Final   Special Requests CERVICAL AND THORACIC EPIDURAL   Final   Gram Stain     Final   Value: MODERATE WBC PRESENT, PREDOMINANTLY PMN     MODERATE GRAM POSITIVE COCCI IN PAIRS     RARE SQUAMOUS EPITHELIAL CELLS PRESENT     Gram Stain Report Called to,Read Back By and Verified With: Dyane Dustman RN [phone] [place]   Report Status 11/04/2013 FINAL   Final  CULTURE, ROUTINE-ABSCESS     Status: None   Collection Time    11/03/13 10:16 PM      Result Value Ref Range Status   Specimen Description ABSCESS NECK   Final   Special Requests NO 2 CERVICAL AND THORACIC EPIDURAL   Final   Gram Stain     Final   Value: MODERATE WBC PRESENT, PREDOMINANTLY PMN     RARE SQUAMOUS EPITHELIAL CELLS PRESENT     FEW GRAM POSITIVE COCCI IN PAIRS     Gram Stain Report Called to,Read Back By and Verified With: Gram Stain Report Called to,Read Back By and Verified With: RASHEEZA IN LAB @1235AM  11/04/13 VINCJ     Performed at  [facility]   Culture     Final   Value: FEW STAPHYLOCOCCUS AUREUS     Note: SUSCEPTIBILITIES PERFORMED ON PREVIOUS CULTURE WITHIN THE LAST 5 DAYS.     Performed at [facility]   Report Status 11/06/2013 FINAL   Final  GRAM STAIN     Status: None   Collection Time    11/03/13 10:16 PM      Result Value Ref Range Status   Specimen Description ABSCESS NECK   Final   Special Requests CERVICAL AND THORACIC EPIDURAL    Final   Gram Stain     Final   Value: MODERATE WBC PRESENT, PREDOMINANTLY PMN     FEW GRAM POSITIVE COCCI IN PAIRS     RARE SQUAMOUS EPITHELIAL CELLS PRESENT     Gram Stain Report Called to,Read Back  By and Verified With: Dyane Dustman RN [phone] [place]   Report Status 11/04/2013 FINAL   Final  TISSUE CULTURE     Status: None   Collection Time    11/03/13 10:17 PM      Result Value Ref Range Status   Specimen Description TISSUE NECK   Final   Special Requests NONE   Final   Gram Stain     Final   Value: RARE WBC PRESENT, PREDOMINANTLY PMN     NO SQUAMOUS EPITHELIAL CELLS SEEN     NO ORGANISMS SEEN     Gram Stain Report Called to,Read Back By and Verified With: Gram Stain Report Called to,Read Back By and Verified With: [place] IN LAB @ 1234AM [facility] 11/04/13     Performed at [facility]   Culture     Final   Value: FEW STAPHYLOCOCCUS AUREUS     Note: RIFAMPIN AND GENTAMICIN SHOULD NOT BE USED AS SINGLE DRUGS FOR TREATMENT OF STAPH INFECTIONS.     Performed at [facility]   Report Status 11/06/2013 FINAL   Final   Organism ID, Bacteria STAPHYLOCOCCUS AUREUS   Final  GRAM STAIN     Status: None   Collection Time    11/03/13 10:17 PM      Result Value Ref Range Status   Specimen Description TISSUE NECK   Final   Special Requests NONE   Final   Gram Stain     Final   Value: RARE WBC PRESENT, PREDOMINANTLY PMN     NO ORGANISMS SEEN     NO SQUAMOUS EPITHELIAL CELLS SEEN      Gram Stain Report Called to,Read Back By and Verified With: Dyane Dustman RN [phone] [place]   Report Status 11/04/2013 FINAL   Final  MRSA PCR SCREENING     Status: None   Collection Time    11/04/13  2:13 AM      Result Value Ref Range Status   MRSA by PCR NEGATIVE  NEGATIVE Final   Comment:            The GeneXpert MRSA Assay (FDA     approved for NASAL specimens     only), is one component of a     comprehensive MRSA colonization     surveillance program. It is not     intended to diagnose MRSA     infection nor to guide or     monitor treatment for     MRSA infections.    Studies/Results: No results found.    Assessment/Plan:  Active Problems:   Abscess in epidural space of cervical spine   Sepsis(995.91)   Atelectasis   Hypoxemia    Michael Morton is a 73 y.o. male with  MSSA bacteremia and epidural, Cervical-thoracic region sp Neurosurgery.  #1 MSSA Bacteremia:  --NOTE he did NOT have documented clearance of his bacteremia PRIOR to insertion of his pICC line  --THEREFORE DC PICC line, culture tip --repeat blood cultures post removal of PICC --wait several days to ensure that REPEAT blood cultures are negative prior to insertion of NEW pICC line  --He needs TEE to evaluate heart valves  --would treat with 6 if not 8 weeks of IV antibiotics with day # 1 being tomorrow  --if pt has signfificant Left sided endocarditis  may wish for MRI brain to ensure no septic emboli to brain that would warrant change from Ancef to High dose Nafcillin  #2 Epidural abscess in C, T spine sp Neurosurgery: --as above would treat for 6-8 weeks of IV abx   #3 Screening: check HIV and hep panel     LOS: 7 days   Truman Hayward 11/10/2013, 3:46 PM

## 2013-11-10 NOTE — Progress Notes (Signed)
RUA PICC line d/ced per order.  No signs of infection noted at  Site.  Site cleaned with CHG wipe, covered with vaseline guaze and dry 4x4, with occlusive tape.  NO ADN.

## 2013-11-10 NOTE — Progress Notes (Signed)
  Echocardiogram 2D Echocardiogram has been performed.  Waymon Amato 11/10/2013, 10:29 AM

## 2013-11-10 NOTE — Progress Notes (Signed)
Subjective: Patient reports Offers no complaints other than depressed over hospital stay  Objective: Vital signs in last 24 hours: Temp:  [98 F (36.7 C)-99.2 F (37.3 C)] 99 F (37.2 C) (06/06 1030) Pulse Rate:  [76-88] 78 (06/06 1030) Resp:  [18-20] 18 (06/06 1030) BP: (126-160)/(63-77) 160/74 mmHg (06/06 1030) SpO2:  [94 %-99 %] 96 % (06/06 1030)  Intake/Output from previous day: 06/05 0701 - 06/06 0700 In: -  Out: 1425 [Urine:1425] Intake/Output this shift:    Motor function appears good patient is standing straight and erect.  Lab Results:  Recent Labs  11/07/13 1120  WBC 9.3  HGB 10.9*  HCT 32.0*  PLT 469*   BMET  Recent Labs  11/08/13 0449  NA 137  K 3.5*  CL 101  CO2 26  GLUCOSE 179*  BUN 14  CREATININE 0.89  CALCIUM 8.2*    Studies/Results: No results found.  Assessment/Plan: Currently awaiting transesophageal echo and resolution of home antibiotic regimen.  LOS: 7 days  PICC line apparently needs to be replaced per infectious diseases. Would prefer them to order this if necessary.   Kristeen Miss 11/10/2013, 10:53 AM

## 2013-11-11 LAB — HEPATITIS PANEL, ACUTE
HCV Ab: NEGATIVE
HEP B C IGM: NONREACTIVE
Hep A IgM: NONREACTIVE
Hepatitis B Surface Ag: NEGATIVE

## 2013-11-11 LAB — GLUCOSE, CAPILLARY
GLUCOSE-CAPILLARY: 135 mg/dL — AB (ref 70–99)
GLUCOSE-CAPILLARY: 192 mg/dL — AB (ref 70–99)
Glucose-Capillary: 152 mg/dL — ABNORMAL HIGH (ref 70–99)

## 2013-11-11 NOTE — Progress Notes (Signed)
Patient ID: Michael Morton, male   DOB: Dec 29, 1940, 73 y.o.   MRN: [id number] Subjective: Patient reports that he is doing okay. Not a lot of pain.  Objective: Vital signs in last 24 hours: Temp:  [97.6 F (36.4 C)-99.6 F (37.6 C)] 97.6 F (36.4 C) (06/07 0900) Pulse Rate:  [73-91] 85 (06/07 0900) Resp:  [18-20] 18 (06/07 0900) BP: (135-173)/(55-74) 135/73 mmHg (06/07 0900) SpO2:  [93 %-100 %] 99 % (06/07 0900)  Intake/Output from previous day: 06/06 0701 - 06/07 0700 In: 3 [I.V.:3] Out: 1100 [Urine:1100] Intake/Output this shift:    Neurologic: Grossly normal, incision clean dry and intact  Lab Results: Lab Results  Component Value Date   WBC 9.3 11/07/2013   HGB 10.9* 11/07/2013   HCT 32.0* 11/07/2013   MCV 95.8 11/07/2013   PLT 469* 11/07/2013   No results found for this basename: INR, PROTIME   BMET Lab Results  Component Value Date   NA 137 11/08/2013   K 3.5* 11/08/2013   CL 101 11/08/2013   CO2 26 11/08/2013   GLUCOSE 179* 11/08/2013   BUN 14 11/08/2013   CREATININE 0.89 11/08/2013   CALCIUM 8.2* 11/08/2013    Studies/Results: No results found.  Assessment/Plan: Awaiting cultures so that PICC line can be replaced. Continue antibiotics.   LOS: 8 days    [name] 11/11/2013, 10:28 AM

## 2013-11-11 NOTE — Progress Notes (Signed)
[facility]   Ancef day # 5  Subjective: No new complaints   Antibiotics:  Anti-infectives   Start     Dose/Rate Route Frequency Ordered Stop   11/07/13 1800  ceFAZolin (ANCEF) IVPB 2 g/50 mL premix     2 g 100 mL/hr over 30 Minutes Intravenous 3 times per day 11/07/13 1716     11/06/13 2100  ceFAZolin (ANCEF) IVPB 2 g/50 mL premix  Status:  Discontinued     2 g 100 mL/hr over 30 Minutes Intravenous 3 times per day 11/06/13 1532 11/06/13 1534   11/06/13 2000  nafcillin 2 g in dextrose 5 % 50 mL IVPB  Status:  Discontinued     2 g 100 mL/hr over 30 Minutes Intravenous 6 times per day 11/06/13 1534 11/07/13 1716   11/04/13 1000  acyclovir (ZOVIRAX) tablet 800 mg  Status:  Discontinued     800 mg Oral Daily 11/04/13 0249 11/08/13 1105   11/04/13 1000  vancomycin (VANCOCIN) 1,250 mg in sodium chloride 0.9 % 250 mL IVPB  Status:  Discontinued     1,250 mg 166.7 mL/hr over 90 Minutes Intravenous Every 12 hours 11/04/13 0309 11/06/13 1532   11/04/13 0400  metroNIDAZOLE (FLAGYL) IVPB 500 mg  Status:  Discontinued     500 mg 100 mL/hr over 60 Minutes Intravenous Every 8 hours 11/04/13 0302 11/06/13 1532   11/04/13 0315  cefTAZidime (FORTAZ) 2 g in dextrose 5 % 50 mL IVPB  Status:  Discontinued     2 g 100 mL/hr over 30 Minutes Intravenous 3 times per day 11/04/13 0309 11/06/13 1532   11/03/13 2018  vancomycin (VANCOCIN) 1 GM/200ML IVPB    Comments:  Haynes Bast   : cabinet override      11/03/13 2018 11/04/13 0829      Medications: Scheduled Meds: . amLODipine  5 mg Oral Daily  . aspirin EC  81 mg Oral Daily  . atorvastatin  20 mg Oral q1800  .  ceFAZolin (ANCEF) IV  2 g Intravenous 3 times per day  . glyBURIDE  5 mg Oral BID WC  . insulin aspart  0-15 Units Subcutaneous TID WC  . metoprolol  50 mg Oral BID  . senna  1 tablet Oral BID  . sertraline  50 mg Oral Daily  . sodium chloride  3 mL Intravenous Q12H  . tamsulosin  0.4 mg Oral Daily    . traZODone  100 mg Oral QHS  . venlafaxine  25 mg Oral TID   Continuous Infusions: . sodium chloride 10 mL/hr at 11/05/13 1600  . sodium chloride    . sodium chloride     PRN Meds:.acetaminophen, alum & mag hydroxide-simeth, diazepam, HYDROcodone-acetaminophen, menthol-cetylpyridinium, morphine injection, nitroGLYCERIN, ondansetron (ZOFRAN) IV, phenol, polyethylene glycol, sodium chloride, sodium chloride    Objective: Weight change:   Intake/Output Summary (Last 24 hours) at 11/11/13 1856 Last data filed at 11/11/13 1700  Gross per 24 hour  Intake    243 ml  Output   1100 ml  Net   -857 ml   Blood pressure 154/60, pulse 96, temperature 99.8 F (37.7 C), temperature source Oral, resp. rate 18, height 6\' 2"  (1.88 m), weight 199 lb 1.2 oz (90.3 kg), SpO2 94.00%. Temp:  [97.6 F (36.4 C)-99.8 F (37.7 C)] 99.8 F (37.7 C) (06/07 1750) Pulse Rate:  [73-96] 96 (06/07 1750) Resp:  [18-20] 18 (06/07 1750) BP: (135-154)/(55-73) 154/60 mmHg (06/07 1750) SpO2:  [93 %-  99 %] 94 % (06/07 1750)  Physical Exam: General: Alert and awake, oriented x3, not in any acute distress.  CV: RRR no mgr Pulm  CTAB no w, r, r GI: soft nd, nt, +bs Neuro: nonfocal Skin: surgical site with staples is clean  CBC:  Recent Labs Lab 11/05/13 0244 11/07/13 1120  HGB 9.7* 10.9*  HCT 28.5* 32.0*  PLT 333 469*     BMET No results found for this basename: NA, K, CL, CO2, GLUCOSE, BUN, CREATININE, CALCIUM,  in the last 72 hours   Liver Panel  No results found for this basename: PROT, ALBUMIN, AST, ALT, ALKPHOS, BILITOT, BILIDIR, IBILI,  in the last 72 hours     Sedimentation Rate No results found for this basename: ESRSEDRATE,  in the last 72 hours C-Reactive Protein No results found for this basename: CRP,  in the last 72 hours  Micro Results: Recent Results (from the past 240 hour(s))  CULTURE, BLOOD (ROUTINE X 2)     Status: None   Collection Time    11/03/13 12:13 PM       Result Value Ref Range Status   Specimen Description BLOOD RIGHT ARM   Final   Special Requests BOTTLES DRAWN AEROBIC AND ANAEROBIC 5CC   Final   Culture  Setup Time     Final   Value: 11/03/2013 18:51     Performed at [facility]   Culture     Final   Value: NO GROWTH 5 DAYS     Performed at [facility]   Report Status 11/09/2013 FINAL   Final  CULTURE, BLOOD (ROUTINE X 2)     Status: None   Collection Time    11/03/13 12:19 PM      Result Value Ref Range Status   Specimen Description BLOOD LEFT ARM   Final   Special Requests BOTTLES DRAWN AEROBIC AND ANAEROBIC 5CCA   Final   Culture  Setup Time     Final   Value: 11/03/2013 18:51     Performed at [facility]   Culture     Final   Value: STAPHYLOCOCCUS AUREUS     Note: RIFAMPIN AND GENTAMICIN SHOULD NOT BE USED AS SINGLE DRUGS FOR TREATMENT OF STAPH INFECTIONS.     Note: Gram Stain Report Called to,Read Back By and Verified With: FRAN S@[id number]  ON [id number] BY [facility]     Performed at [facility]   Report Status 11/10/2013 FINAL   Final   Organism ID, Bacteria STAPHYLOCOCCUS AUREUS   Final  CULTURE, ROUTINE-ABSCESS     Status: None   Collection Time    11/03/13 10:09 PM      Result Value Ref Range Status   Specimen Description ABSCESS NECK   Final   Special Requests CERVICAL AND THORACIC EPIDURAL   Final   Gram Stain     Final   Value: MODERATE WBC PRESENT, PREDOMINANTLY PMN     RARE SQUAMOUS EPITHELIAL CELLS PRESENT     MODERATE GRAM POSITIVE COCCI IN PAIRS     Gram Stain Report Called to,Read Back By and Verified With: Gram Stain Report Called to,Read Back By and Verified With: RASHEEZA IN LAB @12363AM  BY [facility] 11/04/13     Performed at [facility]   Culture     Final   Value: MODERATE STAPHYLOCOCCUS AUREUS     Note: RIFAMPIN AND GENTAMICIN SHOULD NOT BE USED AS SINGLE DRUGS FOR TREATMENT OF STAPH INFECTIONS.  Performed at [facility]   Report Status 11/06/2013 FINAL    Final   Organism ID, Bacteria STAPHYLOCOCCUS AUREUS   Final  ANAEROBIC CULTURE     Status: None   Collection Time    11/03/13 10:09 PM      Result Value Ref Range Status   Specimen Description ABSCESS NECK   Final   Special Requests CERVICAL AND THORACIC EPIDURAL   Final   Gram Stain     Final   Value: MODERATE WBC PRESENT, PREDOMINANTLY PMN     RARE SQUAMOUS EPITHELIAL CELLS PRESENT     MODERATE GRAM POSITIVE COCCI IN PAIRS     Gram Stain Report Called to,Read Back By and Verified With: Gram Stain Report Called to,Read Back By and Verified With: RASHEEZA IN LAB @ 1211AM BY [facility] 11/04/13     Performed at [facility]   Culture     Final   Value: NO ANAEROBES ISOLATED     Performed at [facility]   Report Status 11/08/2013 FINAL   Final  GRAM STAIN     Status: None   Collection Time    11/03/13 10:09 PM      Result Value Ref Range Status   Specimen Description ABSCESS NECK   Final   Special Requests CERVICAL AND THORACIC EPIDURAL   Final   Gram Stain     Final   Value: MODERATE WBC PRESENT, PREDOMINANTLY PMN     MODERATE GRAM POSITIVE COCCI IN PAIRS     RARE SQUAMOUS EPITHELIAL CELLS PRESENT     Gram Stain Report Called to,Read Back By and Verified With: Dyane Dustman RN [phone] [place]   Report Status 11/04/2013 FINAL   Final  CULTURE, ROUTINE-ABSCESS     Status: None   Collection Time    11/03/13 10:16 PM      Result Value Ref Range Status   Specimen Description ABSCESS NECK   Final   Special Requests NO 2 CERVICAL AND THORACIC EPIDURAL   Final   Gram Stain     Final   Value: MODERATE WBC PRESENT, PREDOMINANTLY PMN     RARE SQUAMOUS EPITHELIAL CELLS PRESENT     FEW GRAM POSITIVE COCCI IN PAIRS     Gram Stain Report Called to,Read Back By and Verified With: Gram Stain Report Called to,Read Back By and Verified With: RASHEEZA IN LAB @1235AM  11/04/13 [facility]     Performed at [facility]    Culture     Final   Value: FEW STAPHYLOCOCCUS AUREUS     Note: SUSCEPTIBILITIES PERFORMED ON PREVIOUS CULTURE WITHIN THE LAST 5 DAYS.     Performed at [facility]   Report Status 11/06/2013 FINAL   Final  GRAM STAIN     Status: None   Collection Time    11/03/13 10:16 PM      Result Value Ref Range Status   Specimen Description ABSCESS NECK   Final   Special Requests CERVICAL AND THORACIC EPIDURAL    Final   Gram Stain     Final   Value: MODERATE WBC PRESENT, PREDOMINANTLY PMN     FEW GRAM POSITIVE COCCI IN PAIRS     RARE SQUAMOUS EPITHELIAL CELLS PRESENT     Gram Stain Report Called to,Read Back By and Verified With: Dyane Dustman RN [phone] [place]  DRIVE BY JANICE VINCENT   Report Status 11/04/2013 FINAL   Final  TISSUE CULTURE     Status: None   Collection Time    11/03/13 10:17 PM      Result Value Ref Range Status   Specimen Description TISSUE NECK   Final   Special Requests NONE   Final   Gram Stain     Final   Value: RARE WBC PRESENT, PREDOMINANTLY PMN     NO SQUAMOUS EPITHELIAL CELLS SEEN     NO ORGANISMS SEEN     Gram Stain Report Called to,Read Back By and Verified With: Gram Stain Report Called to,Read Back By and Verified With: RASHEEZA IN LAB @ 1234AM [facility] 11/04/13     Performed at [facility]   Culture     Final   Value: FEW STAPHYLOCOCCUS AUREUS     Note: RIFAMPIN AND GENTAMICIN SHOULD NOT BE USED AS SINGLE DRUGS FOR TREATMENT OF STAPH INFECTIONS.     Performed at [facility]   Report Status 11/06/2013 FINAL   Final   Organism ID, Bacteria STAPHYLOCOCCUS AUREUS   Final  GRAM STAIN     Status: None   Collection Time    11/03/13 10:17 PM      Result Value Ref Range Status   Specimen Description TISSUE NECK   Final   Special Requests NONE   Final   Gram Stain     Final   Value: RARE WBC PRESENT, PREDOMINANTLY PMN     NO ORGANISMS SEEN     NO SQUAMOUS EPITHELIAL CELLS SEEN     Gram Stain Report  Called to,Read Back By and Verified With: Dyane Dustman RN [phone] [place]   Report Status 11/04/2013 FINAL   Final  MRSA PCR SCREENING     Status: None   Collection Time    11/04/13  2:13 AM      Result Value Ref Range Status   MRSA by PCR NEGATIVE  NEGATIVE Final   Comment:            The GeneXpert MRSA Assay (FDA     approved for NASAL specimens     only), is one component of a     comprehensive MRSA colonization     surveillance program. It is not     intended to diagnose MRSA     infection nor to guide or     monitor treatment for     MRSA infections.  CULTURE, BLOOD (ROUTINE X 2)     Status: None   Collection Time    11/09/13 11:00 AM      Result Value Ref Range Status   Specimen Description BLOOD RIGHT ANTECUBITAL   Final   Special Requests BOTTLES DRAWN AEROBIC AND ANAEROBIC 10CC   Final   Culture  Setup Time     Final   Value: 11/10/2013 06:21     Performed at [facility]   Culture     Final   Value:        BLOOD CULTURE RECEIVED NO GROWTH TO DATE CULTURE WILL BE HELD FOR 5 DAYS BEFORE ISSUING A FINAL NEGATIVE REPORT     Performed at [facility]   Report Status PENDING   Incomplete  CULTURE, BLOOD (ROUTINE X 2)     Status: None   Collection Time    11/09/13 11:20 AM  Result Value Ref Range Status   Specimen Description BLOOD LEFT HAND   Final   Special Requests BOTTLES DRAWN AEROBIC ONLY [facility]   Final   Culture  Setup Time     Final   Value: 11/10/2013 06:21     Performed at [facility]   Culture     Final   Value:        BLOOD CULTURE RECEIVED NO GROWTH TO DATE CULTURE WILL BE HELD FOR 5 DAYS BEFORE ISSUING A FINAL NEGATIVE REPORT     Performed at [facility]   Report Status PENDING   Incomplete  CATH TIP CULTURE     Status: None   Collection Time    11/10/13 11:20 AM      Result Value Ref Range Status   Specimen Description CATH TIP   Final   Special Requests  NONE   Final   Culture     Final   Value: NO GROWTH 1 DAY     Performed at [facility]   Report Status PENDING   Incomplete    Studies/Results: No results found.    Assessment/Plan:  Active Problems:   Abscess in epidural space of cervical spine   Sepsis(995.91)   Atelectasis   Hypoxemia    Michael Morton is a 73 y.o. male with  MSSA bacteremia and epidural, Cervical-thoracic region sp Neurosurgery.  #1 MSSA Bacteremia:  --NOTE he did NOT have documented clearance of his bacteremia PRIOR to insertion of his pICC line THEREFORE PICC line dc'd  Cultured tip  --repeat blood cultures post removal of PICC --wait several days to ensure that REPEAT blood cultures are negative prior to insertion of NEW pICC line  --He needs TEE to evaluate heart valves  --would treat with 6 if not 8 weeks of IV antibiotics with day # 1 being tomorrow  --if pt has signfificant Left sided endocarditis may wish for MRI brain to ensure no septic emboli to brain that would warrant change from Ancef to High dose Nafcillin  #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 Baxter Flattery back tomorrow.    LOS: 8 days   Michael Morton 11/11/2013, 6:56 PM

## 2013-11-12 ENCOUNTER — Encounter (HOSPITAL_COMMUNITY)
Admission: EM | Disposition: A | Discharge status: Home Health | Payer: Self-pay | Source: Home / Self Care | Attending: Neurosurgery

## 2013-11-12 ENCOUNTER — Encounter (HOSPITAL_COMMUNITY): Payer: Self-pay | Admitting: Gastroenterology

## 2013-11-12 DIAGNOSIS — I38 Endocarditis, valve unspecified: Secondary | ICD-10-CM

## 2013-11-12 HISTORY — PX: TEE WITHOUT CARDIOVERSION: SHX5443

## 2013-11-12 LAB — CBC WITH DIFFERENTIAL/PLATELET

## 2013-11-12 LAB — BASIC METABOLIC PANEL
BUN: 13 mg/dL (ref 6–23)
CO2: 27 meq/L (ref 19–32)
CREATININE: 0.93 mg/dL (ref 0.50–1.35)
Calcium: 9 mg/dL (ref 8.4–10.5)
Chloride: 100 mEq/L (ref 96–112)
GFR calc Af Amer: >90 mL/min (ref >90)
GFR calc non Af Amer: 83 mL/min — ABNORMAL LOW (ref >90)
Glucose, Bld: 131 mg/dL — ABNORMAL HIGH (ref 70–99)
Potassium: 5.1 mEq/L (ref 3.7–5.3)
Sodium: 139 mEq/L (ref 137–147)

## 2013-11-12 LAB — GLUCOSE, CAPILLARY
GLUCOSE-CAPILLARY: 100 mg/dL — AB (ref 70–99)
GLUCOSE-CAPILLARY: 200 mg/dL — AB (ref 70–99)
Glucose-Capillary: 145 mg/dL — ABNORMAL HIGH (ref 70–99)
Glucose-Capillary: 128 mg/dL — ABNORMAL HIGH (ref 70–99)

## 2013-11-12 LAB — HIV-1 RNA QUANT-NO REFLEX-BLD: HIV 1 RNA Quant: <20 copies/mL (ref <20)

## 2013-11-12 LAB — CBC
HCT: 31.9 % — ABNORMAL LOW (ref 39.0–52.0)
Hemoglobin: 10.6 g/dL — ABNORMAL LOW (ref 13.0–17.0)
MCH: 31.9 pg (ref 26.0–34.0)
MCHC: 33.2 g/dL (ref 30.0–36.0)
MCV: 96.1 fL (ref 78.0–100.0)
Platelets: 574 10*3/uL — ABNORMAL HIGH (ref 150–400)
RBC: 3.32 MIL/uL — ABNORMAL LOW (ref 4.22–5.81)
RDW: 12.4 % (ref 11.5–15.5)
WBC: 8.3 10*3/uL (ref 4.0–10.5)

## 2013-11-12 SURGERY — ECHOCARDIOGRAM, TRANSESOPHAGEAL
Anesthesia: Moderate Sedation

## 2013-11-12 MED ORDER — FENTANYL CITRATE 0.05 MG/ML IJ SOLN
INTRAMUSCULAR | Status: DC | PRN
Start: 2013-11-12 — End: 2013-11-12
  Administered 2013-11-12 (×2): 25 ug via INTRAVENOUS

## 2013-11-12 MED ORDER — DIPHENHYDRAMINE HCL 50 MG/ML IJ SOLN
INTRAMUSCULAR | Status: AC
  Filled 2013-11-12: qty 1

## 2013-11-12 MED ORDER — MIDAZOLAM HCL 5 MG/ML IJ SOLN
INTRAMUSCULAR | Status: AC
  Filled 2013-11-12: qty 1

## 2013-11-12 MED ORDER — FENTANYL CITRATE 0.05 MG/ML IJ SOLN
INTRAMUSCULAR | Status: AC
  Filled 2013-11-12: qty 2

## 2013-11-12 MED ORDER — MIDAZOLAM HCL 10 MG/2ML IJ SOLN
INTRAMUSCULAR | Status: DC | PRN
  Administered 2013-11-12 (×2): 2 mg via INTRAVENOUS

## 2013-11-12 MED ORDER — BUTAMBEN-TETRACAINE-BENZOCAINE 2-2-14 % EX AERO
INHALATION_SPRAY | CUTANEOUS | Status: DC | PRN
  Administered 2013-11-12: 2 via TOPICAL

## 2013-11-12 NOTE — H&P (View-Only) (Signed)
[facility] for Infectious Disease   Ancef day # 5  Subjective: No new complaints   Antibiotics:  Anti-infectives   Start     Dose/Rate Route Frequency Ordered Stop   11/07/13 1800  ceFAZolin (ANCEF) IVPB 2 g/50 mL premix     2 g 100 mL/hr over 30 Minutes Intravenous 3 times per day 11/07/13 1716     11/06/13 2100  ceFAZolin (ANCEF) IVPB 2 g/50 mL premix  Status:  Discontinued     2 g 100 mL/hr over 30 Minutes Intravenous 3 times per day 11/06/13 1532 11/06/13 1534   11/06/13 2000  nafcillin 2 g in dextrose 5 % 50 mL IVPB  Status:  Discontinued     2 g 100 mL/hr over 30 Minutes Intravenous 6 times per day 11/06/13 1534 11/07/13 1716   11/04/13 1000  acyclovir (ZOVIRAX) tablet 800 mg  Status:  Discontinued     800 mg Oral Daily 11/04/13 0249 11/08/13 1105   11/04/13 1000  vancomycin (VANCOCIN) 1,250 mg in sodium chloride 0.9 % 250 mL IVPB  Status:  Discontinued     1,250 mg 166.7 mL/hr over 90 Minutes Intravenous Every 12 hours 11/04/13 0309 11/06/13 1532   11/04/13 0400  metroNIDAZOLE (FLAGYL) IVPB 500 mg  Status:  Discontinued     500 mg 100 mL/hr over 60 Minutes Intravenous Every 8 hours 11/04/13 0302 11/06/13 1532   11/04/13 0315  cefTAZidime (FORTAZ) 2 g in dextrose 5 % 50 mL IVPB  Status:  Discontinued     2 g 100 mL/hr over 30 Minutes Intravenous 3 times per day 11/04/13 0309 11/06/13 1532   11/03/13 2018  vancomycin (VANCOCIN) 1 GM/200ML IVPB    Comments:  Haynes Bast   : cabinet override      11/03/13 2018 11/04/13 0829      Medications: Scheduled Meds: . amLODipine  5 mg Oral Daily  . aspirin EC  81 mg Oral Daily  . atorvastatin  20 mg Oral q1800  .  ceFAZolin (ANCEF) IV  2 g Intravenous 3 times per day  . glyBURIDE  5 mg Oral BID WC  . insulin aspart  0-15 Units Subcutaneous TID WC  . metoprolol  50 mg Oral BID  . senna  1 tablet Oral BID  . sertraline  50 mg Oral Daily  . sodium chloride  3 mL Intravenous Q12H  . tamsulosin  0.4 mg Oral Daily    . traZODone  100 mg Oral QHS  . venlafaxine  25 mg Oral TID   Continuous Infusions: . sodium chloride 10 mL/hr at 11/05/13 1600  . sodium chloride    . sodium chloride     PRN Meds:.acetaminophen, alum & mag hydroxide-simeth, diazepam, HYDROcodone-acetaminophen, menthol-cetylpyridinium, morphine injection, nitroGLYCERIN, ondansetron (ZOFRAN) IV, phenol, polyethylene glycol, sodium chloride, sodium chloride    Objective: Weight change:   Intake/Output Summary (Last 24 hours) at 11/11/13 1856 Last data filed at 11/11/13 1700  Gross per 24 hour  Intake    243 ml  Output   1100 ml  Net   -857 ml   Blood pressure 154/60, pulse 96, temperature 99.8 F (37.7 C), temperature source Oral, resp. rate 18, height 6\' 2"  (1.88 m), weight 199 lb 1.2 oz (90.3 kg), SpO2 94.00%. Temp:  [97.6 F (36.4 C)-99.8 F (37.7 C)] 99.8 F (37.7 C) (06/07 1750) Pulse Rate:  [73-96] 96 (06/07 1750) Resp:  [18-20] 18 (06/07 1750) BP: (135-154)/(55-73) 154/60 mmHg (06/07 1750) SpO2:  [93 %-  99 %] 94 % (06/07 1750)  Physical Exam: General: Alert and awake, oriented x3, not in any acute distress.  CV: RRR no mgr Pulm  CTAB no w, r, r GI: soft nd, nt, +bs Neuro: nonfocal Skin: surgical site with staples is clean  CBC:  Recent Labs Lab 11/05/13 0244 11/07/13 1120  HGB 9.7* 10.9*  HCT 28.5* 32.0*  PLT 333 469*     BMET No results found for this basename: NA, K, CL, CO2, GLUCOSE, BUN, CREATININE, CALCIUM,  in the last 72 hours   Liver Panel  No results found for this basename: PROT, ALBUMIN, AST, ALT, ALKPHOS, BILITOT, BILIDIR, IBILI,  in the last 72 hours     Sedimentation Rate No results found for this basename: ESRSEDRATE,  in the last 72 hours C-Reactive Protein No results found for this basename: CRP,  in the last 72 hours  Micro Results: Recent Results (from the past 240 hour(s))  CULTURE, BLOOD (ROUTINE X 2)     Status: None   Collection Time    11/03/13 12:13 PM       Result Value Ref Range Status   Specimen Description BLOOD RIGHT ARM   Final   Special Requests BOTTLES DRAWN AEROBIC AND ANAEROBIC 5CC   Final   Culture  Setup Time     Final   Value: 11/03/2013 18:51     Performed at [facility]   Culture     Final   Value: NO GROWTH 5 DAYS     Performed at [facility]   Report Status 11/09/2013 FINAL   Final  CULTURE, BLOOD (ROUTINE X 2)     Status: None   Collection Time    11/03/13 12:19 PM      Result Value Ref Range Status   Specimen Description BLOOD LEFT ARM   Final   Special Requests BOTTLES DRAWN AEROBIC AND ANAEROBIC 5CCA   Final   Culture  Setup Time     Final   Value: 11/03/2013 18:51     Performed at [facility]   Culture     Final   Value: STAPHYLOCOCCUS AUREUS     Note: RIFAMPIN AND GENTAMICIN SHOULD NOT BE USED AS SINGLE DRUGS FOR TREATMENT OF STAPH INFECTIONS.     Note: Gram Stain Report Called to,Read Back By and Verified With: FRAN S@[id number]  ON [id number] BY [facility]     Performed at [facility]   Report Status 11/10/2013 FINAL   Final   Organism ID, Bacteria STAPHYLOCOCCUS AUREUS   Final  CULTURE, ROUTINE-ABSCESS     Status: None   Collection Time    11/03/13 10:09 PM      Result Value Ref Range Status   Specimen Description ABSCESS NECK   Final   Special Requests CERVICAL AND THORACIC EPIDURAL   Final   Gram Stain     Final   Value: MODERATE WBC PRESENT, PREDOMINANTLY PMN     RARE SQUAMOUS EPITHELIAL CELLS PRESENT     MODERATE GRAM POSITIVE COCCI IN PAIRS     Gram Stain Report Called to,Read Back By and Verified With: Gram Stain Report Called to,Read Back By and Verified With: RASHEEZA IN LAB @12363AM  BY [facility] 11/04/13     Performed at [facility]   Culture     Final   Value: MODERATE STAPHYLOCOCCUS AUREUS     Note: RIFAMPIN AND GENTAMICIN SHOULD NOT BE USED AS SINGLE DRUGS FOR TREATMENT OF STAPH INFECTIONS.  Performed at [facility]   Report Status 11/06/2013 FINAL    Final   Organism ID, Bacteria STAPHYLOCOCCUS AUREUS   Final  ANAEROBIC CULTURE     Status: None   Collection Time    11/03/13 10:09 PM      Result Value Ref Range Status   Specimen Description ABSCESS NECK   Final   Special Requests CERVICAL AND THORACIC EPIDURAL   Final   Gram Stain     Final   Value: MODERATE WBC PRESENT, PREDOMINANTLY PMN     RARE SQUAMOUS EPITHELIAL CELLS PRESENT     MODERATE GRAM POSITIVE COCCI IN PAIRS     Gram Stain Report Called to,Read Back By and Verified With: Gram Stain Report Called to,Read Back By and Verified With: RASHEEZA IN LAB @ 1211AM BY [facility] 11/04/13     Performed at [facility]   Culture     Final   Value: NO ANAEROBES ISOLATED     Performed at [facility]   Report Status 11/08/2013 FINAL   Final  GRAM STAIN     Status: None   Collection Time    11/03/13 10:09 PM      Result Value Ref Range Status   Specimen Description ABSCESS NECK   Final   Special Requests CERVICAL AND THORACIC EPIDURAL   Final   Gram Stain     Final   Value: MODERATE WBC PRESENT, PREDOMINANTLY PMN     MODERATE GRAM POSITIVE COCCI IN PAIRS     RARE SQUAMOUS EPITHELIAL CELLS PRESENT     Gram Stain Report Called to,Read Back By and Verified With: Dyane Dustman RN [phone] [place]   Report Status 11/04/2013 FINAL   Final  CULTURE, ROUTINE-ABSCESS     Status: None   Collection Time    11/03/13 10:16 PM      Result Value Ref Range Status   Specimen Description ABSCESS NECK   Final   Special Requests NO 2 CERVICAL AND THORACIC EPIDURAL   Final   Gram Stain     Final   Value: MODERATE WBC PRESENT, PREDOMINANTLY PMN     RARE SQUAMOUS EPITHELIAL CELLS PRESENT     FEW GRAM POSITIVE COCCI IN PAIRS     Gram Stain Report Called to,Read Back By and Verified With: Gram Stain Report Called to,Read Back By and Verified With: RASHEEZA IN LAB @1235AM  11/04/13 VINCJ     Performed at [facility]    Culture     Final   Value: FEW STAPHYLOCOCCUS AUREUS     Note: SUSCEPTIBILITIES PERFORMED ON PREVIOUS CULTURE WITHIN THE LAST 5 DAYS.     Performed at [facility]   Report Status 11/06/2013 FINAL   Final  GRAM STAIN     Status: None   Collection Time    11/03/13 10:16 PM      Result Value Ref Range Status   Specimen Description ABSCESS NECK   Final   Special Requests CERVICAL AND THORACIC EPIDURAL    Final   Gram Stain     Final   Value: MODERATE WBC PRESENT, PREDOMINANTLY PMN     FEW GRAM POSITIVE COCCI IN PAIRS     RARE SQUAMOUS EPITHELIAL CELLS PRESENT     Gram Stain Report Called to,Read Back By and Verified With: Dyane Dustman RN [phone] [place]  DRIVE BY JANICE VINCENT   Report Status 11/04/2013 FINAL   Final  TISSUE CULTURE     Status: None   Collection Time    11/03/13 10:17 PM      Result Value Ref Range Status   Specimen Description TISSUE NECK   Final   Special Requests NONE   Final   Gram Stain     Final   Value: RARE WBC PRESENT, PREDOMINANTLY PMN     NO SQUAMOUS EPITHELIAL CELLS SEEN     NO ORGANISMS SEEN     Gram Stain Report Called to,Read Back By and Verified With: Gram Stain Report Called to,Read Back By and Verified With: RASHEEZA IN LAB @ 1234AM [facility] 11/04/13     Performed at [facility]   Culture     Final   Value: FEW STAPHYLOCOCCUS AUREUS     Note: RIFAMPIN AND GENTAMICIN SHOULD NOT BE USED AS SINGLE DRUGS FOR TREATMENT OF STAPH INFECTIONS.     Performed at [facility]   Report Status 11/06/2013 FINAL   Final   Organism ID, Bacteria STAPHYLOCOCCUS AUREUS   Final  GRAM STAIN     Status: None   Collection Time    11/03/13 10:17 PM      Result Value Ref Range Status   Specimen Description TISSUE NECK   Final   Special Requests NONE   Final   Gram Stain     Final   Value: RARE WBC PRESENT, PREDOMINANTLY PMN     NO ORGANISMS SEEN     NO SQUAMOUS EPITHELIAL CELLS SEEN     Gram Stain Report  Called to,Read Back By and Verified With: Dyane Dustman RN [phone] [place]   Report Status 11/04/2013 FINAL   Final  MRSA PCR SCREENING     Status: None   Collection Time    11/04/13  2:13 AM      Result Value Ref Range Status   MRSA by PCR NEGATIVE  NEGATIVE Final   Comment:            The GeneXpert MRSA Assay (FDA     approved for NASAL specimens     only), is one component of a     comprehensive MRSA colonization     surveillance program. It is not     intended to diagnose MRSA     infection nor to guide or     monitor treatment for     MRSA infections.  CULTURE, BLOOD (ROUTINE X 2)     Status: None   Collection Time    11/09/13 11:00 AM      Result Value Ref Range Status   Specimen Description BLOOD RIGHT ANTECUBITAL   Final   Special Requests BOTTLES DRAWN AEROBIC AND ANAEROBIC 10CC   Final   Culture  Setup Time     Final   Value: 11/10/2013 06:21     Performed at [facility]   Culture     Final   Value:        BLOOD CULTURE RECEIVED NO GROWTH TO DATE CULTURE WILL BE HELD FOR 5 DAYS BEFORE ISSUING A FINAL NEGATIVE REPORT     Performed at [facility]   Report Status PENDING   Incomplete  CULTURE, BLOOD (ROUTINE X 2)     Status: None   Collection Time    11/09/13 11:20 AM  Result Value Ref Range Status   Specimen Description BLOOD LEFT HAND   Final   Special Requests BOTTLES DRAWN AEROBIC ONLY [facility]   Final   Culture  Setup Time     Final   Value: 11/10/2013 06:21     Performed at [facility]   Culture     Final   Value:        BLOOD CULTURE RECEIVED NO GROWTH TO DATE CULTURE WILL BE HELD FOR 5 DAYS BEFORE ISSUING A FINAL NEGATIVE REPORT     Performed at [facility]   Report Status PENDING   Incomplete  CATH TIP CULTURE     Status: None   Collection Time    11/10/13 11:20 AM      Result Value Ref Range Status   Specimen Description CATH TIP   Final   Special Requests  NONE   Final   Culture     Final   Value: NO GROWTH 1 DAY     Performed at [facility]   Report Status PENDING   Incomplete    Studies/Results: No results found.    Assessment/Plan:  Active Problems:   Abscess in epidural space of cervical spine   Sepsis(995.91)   Atelectasis   Hypoxemia    Michael Morton is a 73 y.o. male with  MSSA bacteremia and epidural, Cervical-thoracic region sp Neurosurgery.  #1 MSSA Bacteremia:  --NOTE he did NOT have documented clearance of his bacteremia PRIOR to insertion of his pICC line THEREFORE PICC line dc'd  Cultured tip  --repeat blood cultures post removal of PICC --wait several days to ensure that REPEAT blood cultures are negative prior to insertion of NEW pICC line  --He needs TEE to evaluate heart valves  --would treat with 6 if not 8 weeks of IV antibiotics with day # 1 being tomorrow  --if pt has signfificant Left sided endocarditis may wish for MRI brain to ensure no septic emboli to brain that would warrant change from Ancef to High dose Nafcillin  #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 Baxter Flattery back tomorrow.    LOS: 8 days   Truman Hayward 11/11/2013, 6:56 PM

## 2013-11-12 NOTE — CV Procedure (Signed)
     Transesophageal Echocardiogram Note  Michael Morton [id number] [birth year]  Procedure: Transesophageal Echocardiogram Indications: Endocarditis  Procedure Details Consent: Obtained Time Out: Verified patient identification, verified procedure, site/side was marked, verified correct patient position, special equipment/implants available, Radiology Safety Procedures followed,  medications/allergies/relevent history reviewed, required imaging and test results available.  Performed  Medications: Fentanyl: 50 mcg Versed: 4 mg  Left Ventrical:  The cavity size was normal. Wall thickness was normal. Systolic function was normal. The estimated ejection fraction was in the range of 60% to 65%.   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  Dorothy Spark, MD, [facility] 11/12/2013, 1:17 PM

## 2013-11-12 NOTE — Progress Notes (Signed)
UR complete.  Justis Dupas RN, MSN 

## 2013-11-12 NOTE — Progress Notes (Signed)
Called Vascular lab to confirm patient's TEE procedure.  They did not have him on the list and gave me the number for cardiology to speak with them.  I called and patient was added for 1600 today.  This RN said that the patient was very hungry and had been NPO since midnight.  RN was told patient could eat until 1000.  Patient and his wife were informed and patient's wife went down to get patient breakfast.  Will continue to monitor.  Kizzie Bane, RN

## 2013-11-12 NOTE — Progress Notes (Signed)
[facility]   Ancef day # 6  Subjective: Afebrile, underwent TEE without complication, no vegetations   Antibiotics:  Anti-infectives   Start     Dose/Rate Route Frequency Ordered Stop   11/07/13 1800  ceFAZolin (ANCEF) IVPB 2 g/50 mL premix     2 g 100 mL/hr over 30 Minutes Intravenous 3 times per day 11/07/13 1716     11/06/13 2100  ceFAZolin (ANCEF) IVPB 2 g/50 mL premix  Status:  Discontinued     2 g 100 mL/hr over 30 Minutes Intravenous 3 times per day 11/06/13 1532 11/06/13 1534   11/06/13 2000  nafcillin 2 g in dextrose 5 % 50 mL IVPB  Status:  Discontinued     2 g 100 mL/hr over 30 Minutes Intravenous 6 times per day 11/06/13 1534 11/07/13 1716   11/04/13 1000  acyclovir (ZOVIRAX) tablet 800 mg  Status:  Discontinued     800 mg Oral Daily 11/04/13 0249 11/08/13 1105   11/04/13 1000  vancomycin (VANCOCIN) 1,250 mg in sodium chloride 0.9 % 250 mL IVPB  Status:  Discontinued     1,250 mg 166.7 mL/hr over 90 Minutes Intravenous Every 12 hours 11/04/13 0309 11/06/13 1532   11/04/13 0400  metroNIDAZOLE (FLAGYL) IVPB 500 mg  Status:  Discontinued     500 mg 100 mL/hr over 60 Minutes Intravenous Every 8 hours 11/04/13 0302 11/06/13 1532   11/04/13 0315  cefTAZidime (FORTAZ) 2 g in dextrose 5 % 50 mL IVPB  Status:  Discontinued     2 g 100 mL/hr over 30 Minutes Intravenous 3 times per day 11/04/13 0309 11/06/13 1532   11/03/13 2018  vancomycin (VANCOCIN) 1 GM/200ML IVPB    Comments:  Haynes Bast   : cabinet override      11/03/13 2018 11/04/13 0829      Medications: Scheduled Meds: . amLODipine  5 mg Oral Daily  . aspirin EC  81 mg Oral Daily  . atorvastatin  20 mg Oral q1800  .  ceFAZolin (ANCEF) IV  2 g Intravenous 3 times per day  . glyBURIDE  5 mg Oral BID WC  . insulin aspart  0-15 Units Subcutaneous TID WC  . metoprolol  50 mg Oral BID  . senna  1 tablet Oral BID  . sertraline  50 mg Oral Daily  . sodium chloride  3 mL  Intravenous Q12H  . tamsulosin  0.4 mg Oral Daily  . traZODone  100 mg Oral QHS  . venlafaxine  25 mg Oral TID   Continuous Infusions: . sodium chloride 10 mL/hr at 11/05/13 1600  . sodium chloride    . sodium chloride     PRN Meds:.acetaminophen, alum & mag hydroxide-simeth, diazepam, HYDROcodone-acetaminophen, menthol-cetylpyridinium, morphine injection, nitroGLYCERIN, ondansetron (ZOFRAN) IV, phenol, polyethylene glycol, sodium chloride, sodium chloride    Objective: Weight change:   Intake/Output Summary (Last 24 hours) at 11/12/13 1045 Last data filed at 11/12/13 0800  Gross per 24 hour  Intake    240 ml  Output   1150 ml  Net   -910 ml   Blood pressure 149/65, pulse 80, temperature 98 F (36.7 C), temperature source Oral, resp. rate 18, height 6\' 2"  (1.88 m), weight 199 lb 1.2 oz (90.3 kg), SpO2 96.00%. Temp:  [98 F (36.7 C)-99.8 F (37.7 C)] 98 F (36.7 C) (06/08 0857) Pulse Rate:  [80-96] 80 (06/08 0857) Resp:  [18-20] 18 (06/08 0857) BP: (139-154)/(60-65) 149/65 mmHg (06/08 0857)  SpO2:  [94 %-96 %] 96 % (06/08 0857)  Physical Exam: General: Alert and awake, oriented x3, not in any acute distress.  CV: RRR no mgr Pulm  CTAB no w, r, r GI: soft nd, nt, +bs Neuro: nonfocal Skin: surgical site with staples is clean  CBC:  Recent Labs Lab 11/07/13 1120  HGB 10.9*  HCT 32.0*  PLT 469*      Micro Results: Recent Results (from the past 240 hour(s))  CULTURE, BLOOD (ROUTINE X 2)     Status: None   Collection Time    11/03/13 12:13 PM      Result Value Ref Range Status   Specimen Description BLOOD RIGHT ARM   Final   Special Requests BOTTLES DRAWN AEROBIC AND ANAEROBIC 5CC   Final   Culture  Setup Time     Final   Value: 11/03/2013 18:51     Performed at [facility]   Culture     Final   Value: NO GROWTH 5 DAYS     Performed at [facility]   Report Status 11/09/2013 FINAL   Final  CULTURE, BLOOD (ROUTINE X 2)     Status: None    Collection Time    11/03/13 12:19 PM      Result Value Ref Range Status   Specimen Description BLOOD LEFT ARM   Final   Special Requests BOTTLES DRAWN AEROBIC AND ANAEROBIC 5CCA   Final   Culture  Setup Time     Final   Value: 11/03/2013 18:51     Performed at [facility]   Culture     Final   Value: STAPHYLOCOCCUS AUREUS     Note: RIFAMPIN AND GENTAMICIN SHOULD NOT BE USED AS SINGLE DRUGS FOR TREATMENT OF STAPH INFECTIONS.     Note: Gram Stain Report Called to,Read Back By and Verified With: FRAN S@[id number]  ON [id number] BY [facility]     Performed at [facility]   Report Status 11/10/2013 FINAL   Final   Organism ID, Bacteria STAPHYLOCOCCUS AUREUS   Final  CULTURE, ROUTINE-ABSCESS     Status: None   Collection Time    11/03/13 10:09 PM      Result Value Ref Range Status   Specimen Description ABSCESS NECK   Final   Special Requests CERVICAL AND THORACIC EPIDURAL   Final   Gram Stain     Final   Value: MODERATE WBC PRESENT, PREDOMINANTLY PMN     RARE SQUAMOUS EPITHELIAL CELLS PRESENT     MODERATE GRAM POSITIVE COCCI IN PAIRS     Gram Stain Report Called to,Read Back By and Verified With: Gram Stain Report Called to,Read Back By and Verified With: RASHEEZA IN LAB @12363AM  BY [facility] 11/04/13     Performed at [facility]   Culture     Final   Value: MODERATE STAPHYLOCOCCUS AUREUS     Note: RIFAMPIN AND GENTAMICIN SHOULD NOT BE USED AS SINGLE DRUGS FOR TREATMENT OF STAPH INFECTIONS.     Performed at [facility]   Report Status 11/06/2013 FINAL   Final   Organism ID, Bacteria STAPHYLOCOCCUS AUREUS   Final  ANAEROBIC CULTURE     Status: None   Collection Time    11/03/13 10:09 PM      Result Value Ref Range Status   Specimen Description ABSCESS NECK   Final   Special Requests CERVICAL AND THORACIC EPIDURAL   Final   Gram Stain  Final   Value: MODERATE WBC PRESENT, PREDOMINANTLY PMN     RARE SQUAMOUS EPITHELIAL CELLS PRESENT     MODERATE GRAM POSITIVE  COCCI IN PAIRS     Gram Stain Report Called to,Read Back By and Verified With: Gram Stain Report Called to,Read Back By and Verified With: RASHEEZA IN LAB @ 1211AM BY [facility] 11/04/13     Performed at [facility]   Culture     Final   Value: NO ANAEROBES ISOLATED     Performed at [facility]   Report Status 11/08/2013 FINAL   Final  GRAM STAIN     Status: None   Collection Time    11/03/13 10:09 PM      Result Value Ref Range Status   Specimen Description ABSCESS NECK   Final   Special Requests CERVICAL AND THORACIC EPIDURAL   Final   Gram Stain     Final   Value: MODERATE WBC PRESENT, PREDOMINANTLY PMN     MODERATE GRAM POSITIVE COCCI IN PAIRS     RARE SQUAMOUS EPITHELIAL CELLS PRESENT     Gram Stain Report Called to,Read Back By and Verified With: Dyane Dustman RN [phone] [place]   Report Status 11/04/2013 FINAL   Final  CULTURE, ROUTINE-ABSCESS     Status: None   Collection Time    11/03/13 10:16 PM      Result Value Ref Range Status   Specimen Description ABSCESS NECK   Final   Special Requests NO 2 CERVICAL AND THORACIC EPIDURAL   Final   Gram Stain     Final   Value: MODERATE WBC PRESENT, PREDOMINANTLY PMN     RARE SQUAMOUS EPITHELIAL CELLS PRESENT     FEW GRAM POSITIVE COCCI IN PAIRS     Gram Stain Report Called to,Read Back By and Verified With: Gram Stain Report Called to,Read Back By and Verified With: RASHEEZA IN LAB @1235AM  11/04/13 VINCJ     Performed at [facility]   Culture     Final   Value: FEW STAPHYLOCOCCUS AUREUS     Note: SUSCEPTIBILITIES PERFORMED ON PREVIOUS CULTURE WITHIN THE LAST 5 DAYS.     Performed at [facility]   Report Status 11/06/2013 FINAL   Final  GRAM STAIN     Status: None   Collection Time    11/03/13 10:16 PM      Result Value Ref Range Status   Specimen Description ABSCESS NECK   Final   Special Requests CERVICAL AND THORACIC EPIDURAL    Final    Gram Stain     Final   Value: MODERATE WBC PRESENT, PREDOMINANTLY PMN     FEW GRAM POSITIVE COCCI IN PAIRS     RARE SQUAMOUS EPITHELIAL CELLS PRESENT     Gram Stain Report Called to,Read Back By and Verified With: Dyane Dustman RN [phone] [place] BY JAN[facility]ENT   Report Status 11/04/2013 FINAL   Final  TISSUE CULTURE     Status: None   Collection Time    11/03/13 10:17 PM      Result Value Ref Range Status   Specimen Description TISSUE NECK   Final   Special Requests NONE   Final   Gram Stain     Final   Value: RARE WBC PRESENT, PREDOMINANTLY PMN     NO  SQUAMOUS EPITHELIAL CELLS SEEN     NO ORGANISMS SEEN     Gram Stain Report Called to,Read Back By and Verified With: Gram Stain Report Called to,Read Back By and Verified With: RASHEEZA IN LAB @ 1234AM [facility] 11/04/13     Performed at [facility]   Culture     Final   Value: FEW STAPHYLOCOCCUS AUREUS     Note: RIFAMPIN AND GENTAMICIN SHOULD NOT BE USED AS SINGLE DRUGS FOR TREATMENT OF STAPH INFECTIONS.     Performed at [facility]   Report Status 11/06/2013 FINAL   Final   Organism ID, Bacteria STAPHYLOCOCCUS AUREUS   Final  GRAM STAIN     Status: None   Collection Time    11/03/13 10:17 PM      Result Value Ref Range Status   Specimen Description TISSUE NECK   Final   Special Requests NONE   Final   Gram Stain     Final   Value: RARE WBC PRESENT, PREDOMINANTLY PMN     NO ORGANISMS SEEN     NO SQUAMOUS EPITHELIAL CELLS SEEN     Gram Stain Report Called to,Read Back By and Verified With: Dyane Dustman RN [phone] [place]   Report Status 11/04/2013 FINAL   Final  MRSA PCR SCREENING     Status: None   Collection Time    11/04/13  2:13 AM      Result Value Ref Range Status   MRSA by PCR NEGATIVE  NEGATIVE Final   Comment:            The GeneXpert MRSA Assay (FDA     approved for NASAL specimens     only), is  one component of a     comprehensive MRSA colonization     surveillance program. It is not     intended to diagnose MRSA     infection nor to guide or     monitor treatment for     MRSA infections.  CULTURE, BLOOD (ROUTINE X 2)     Status: None   Collection Time    11/09/13 11:00 AM      Result Value Ref Range Status   Specimen Description BLOOD RIGHT ANTECUBITAL   Final   Special Requests BOTTLES DRAWN AEROBIC AND ANAEROBIC 10CC   Final   Culture  Setup Time     Final   Value: 11/10/2013 06:21     Performed at [facility]   Culture     Final   Value:        BLOOD CULTURE RECEIVED NO GROWTH TO DATE CULTURE WILL BE HELD FOR 5 DAYS BEFORE ISSUING A FINAL NEGATIVE REPORT     Performed at [facility]   Report Status PENDING   Incomplete  CULTURE, BLOOD (ROUTINE X 2)     Status: None   Collection Time    11/09/13 11:20 AM      Result Value Ref Range Status   Specimen Description BLOOD LEFT HAND   Final   Special Requests BOTTLES DRAWN AEROBIC ONLY [facility]   Final   Culture  Setup Time     Final   Value: 11/10/2013 06:21     Performed at [facility]   Culture     Final   Value:        BLOOD CULTURE RECEIVED NO GROWTH TO DATE CULTURE WILL  BE HELD FOR 5 DAYS BEFORE ISSUING A FINAL NEGATIVE REPORT     Performed at [facility]   Report Status PENDING   Incomplete  CATH TIP CULTURE     Status: None   Collection Time    11/10/13 11:20 AM      Result Value Ref Range Status   Specimen Description CATH TIP   Final   Special Requests NONE   Final   Culture     Final   Value: NO GROWTH 2 DAYS     Performed at [facility]   Report Status PENDING   Incomplete  CULTURE, BLOOD (ROUTINE X 2)     Status: None   Collection Time    11/10/13  5:20 PM      Result Value Ref Range Status   Specimen Description BLOOD LEFT ARM   Final   Special Requests BOTTLES DRAWN AEROBIC AND ANAEROBIC [place]   Final   Culture  Setup Time     Final   Value: 11/11/2013 01:04      Performed at [facility]   Culture     Final   Value:        BLOOD CULTURE RECEIVED NO GROWTH TO DATE CULTURE WILL BE HELD FOR 5 DAYS BEFORE ISSUING A FINAL NEGATIVE REPORT     Performed at [facility]   Report Status PENDING   Incomplete  CULTURE, BLOOD (ROUTINE X 2)     Status: None   Collection Time    11/10/13  5:25 PM      Result Value Ref Range Status   Specimen Description BLOOD LEFT HAND   Final   Special Requests BOTTLES DRAWN AEROBIC ONLY 6CC   Final   Culture  Setup Time     Final   Value: 11/11/2013 01:04     Performed at [facility]   Culture     Final   Value:        BLOOD CULTURE RECEIVED NO GROWTH TO DATE CULTURE WILL BE HELD FOR 5 DAYS BEFORE ISSUING A FINAL NEGATIVE REPORT     Performed at [facility]   Report Status PENDING   Incomplete  CULTURE, BLOOD (ROUTINE X 2)     Status: None   Collection Time    11/11/13  6:30 AM      Result Value Ref Range Status   Specimen Description BLOOD LEFT ARM   Final   Special Requests BOTTLES DRAWN AEROBIC AND ANAEROBIC 10CC EACH   Final   Culture  Setup Time     Final   Value: 11/11/2013 13:46     Performed at [facility]   Culture     Final   Value:        BLOOD CULTURE RECEIVED NO GROWTH TO DATE CULTURE WILL BE HELD FOR 5 DAYS BEFORE ISSUING A FINAL NEGATIVE REPORT     Performed at [facility]   Report Status PENDING   Incomplete  CULTURE, BLOOD (ROUTINE X 2)     Status: None   Collection Time    11/11/13  6:45 AM      Result Value Ref Range Status   Specimen Description BLOOD LEFT HAND   Final   Special Requests BOTTLES DRAWN AEROBIC AND ANAEROBIC 10CC EACH   Final   Culture  Setup Time     Final   Value: 11/11/2013 13:45     Performed at [facility]  Culture     Final   Value:        BLOOD CULTURE RECEIVED NO GROWTH TO DATE CULTURE WILL BE HELD FOR 5 DAYS BEFORE ISSUING A FINAL NEGATIVE REPORT     Performed at [facility]   Report Status  PENDING   Incomplete    Studies/Results: No results found.    Assessment/Plan:  Active Problems:   Abscess in epidural space of cervical spine   Sepsis(995.91)   Atelectasis   Hypoxemia    Michael Morton is a 73 y.o. male with  MSSA bacteremia and epidural, Cervical-thoracic region sp Neurosurgery.  #1 MSSA Bacteremia:  -- will need new picc line tomorrow. 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

## 2013-11-12 NOTE — Progress Notes (Signed)
Physical Therapy Treatment Patient Details Name: Michael Morton MRN: [id number] DOB: June 01, 1941 Today's Date: 11/12/2013    History of Present Illness Pt is a 73 y.o. Male s/p Posterior Cervical and Thoracic Evacuation of epidural abscess, C6 to T4 laminectomies on 11/03/13.    PT Comments    Pt is progressing well with gait.  He continues to be mildly unsteady and would benefit from further dynamic gait assessment to determine fall risk as [place].  He needed reinforcement of cervical precautions.  PT will continue to follow acutely.  Plan on DGI next session.    Follow Up Recommendations  No PT follow up;Supervision/Assistance - 24 hour     Equipment Recommendations  None recommended by PT    Recommendations for Other Services   NA     Precautions / Restrictions Precautions Precautions: Cervical Precaution Comments: Pt unable to report cervical precautions. I reinforced no lifting and to log roll to get OOB. His incision is so low and he has no c-spine brace that I feel these are the most pertinant.     Mobility   Transfers Overall transfer level: Modified independent   Transfers: Sit to/from Stand Sit to Stand: Modified independent (Device/Increase time)         General transfer comment: reliance on hands for transitions from low recliner chair.  Ambulation/Gait Ambulation/Gait assistance: Supervision Ambulation Distance (Feet): 450 Feet Assistive device: None Gait Pattern/deviations: Staggering left;Staggering right     General Gait Details: mildly staggering gait pattern.  Will need further dynamic assessment as pt reports that he is out on the golf course quite a bit.    Stairs Stairs: Yes Stairs assistance: Supervision Stair Management: No rails;Alternating pattern;Forwards Number of Stairs: 5 General stair comments: he did stuble once on descent and have to catch to the railing for support.          Balance Overall  balance assessment: Needs assistance Sitting-balance support: Feet supported;No upper extremity supported Sitting balance-Leahy Scale: Good     Standing balance support: No upper extremity supported Standing balance-Leahy Scale: Fair                      Cognition Arousal/Alertness: Awake/alert Behavior During Therapy: WFL for tasks assessed/performed Overall Cognitive Status: Within Functional Limits for tasks assessed       Memory: Decreased recall of precautions                     Pertinent Vitals/Pain See vitals flow sheet.            PT Goals (current goals can now be found in the care plan section) Acute Rehab PT Goals Patient Stated Goal: to walk more and get home to my buddies  PT Goal Formulation: With patient Time For Goal Achievement: 11/19/13 Potential to Achieve Goals: Good Progress towards PT goals: Progressing toward goals    Frequency  Min 3X/week    PT Plan Current plan remains appropriate       End of Session   Activity Tolerance: Patient tolerated treatment well Patient left: in chair;with call bell/phone within reach     Time: 1209-1225 PT Time Calculation (min): 16 min  Charges:  $Gait Training: 8-22 mins                     Taela Charbonneau B. [place], [place], DPT [phone]   11/12/2013, 1:01 PM

## 2013-11-12 NOTE — Interval H&P Note (Signed)
History and Physical Interval Note:  11/12/2013 1:16 PM  Michael Morton  has presented today for surgery, with the diagnosis of r/o endocarditis   The various methods of treatment have been discussed with the patient and family. After consideration of risks, benefits and other options for treatment, the patient has consented to  Procedure(s): TRANSESOPHAGEAL ECHOCARDIOGRAM (TEE) (N/A) as a surgical intervention .  The patient's history has been reviewed, patient examined, no change in status, stable for surgery.  I have reviewed the patient's chart and labs.  Questions were answered to the patient's satisfaction.     Dorothy Spark

## 2013-11-12 NOTE — Progress Notes (Signed)
Patient ID: Michael Morton, male   DOB: December 12, 1940, 73 y.o.   MRN: [id number] BP 156/61  Pulse 86  Temp(Src) 98.2 F (36.8 C) (Oral)  Resp 16  Ht 6\' 2"  (1.88 m)  Wt 90.3 kg (199 lb 1.2 oz)  BMI 25.55 kg/m2  SpO2 96% Alert and oriented No vegetations from TEE, can be discharged after new picc line is placed.  Would appreciate if ID will leave dosing, and instructions for abx in the followup section of the discharge. Again thank you for your help.

## 2013-11-12 NOTE — Progress Notes (Signed)
*  PRELIMINARY RESULTS* Echocardiogram Echocardiogram Transesophageal has been performed.  Michael Morton 11/12/2013, 4:44 PM

## 2013-11-13 ENCOUNTER — Encounter (HOSPITAL_COMMUNITY): Payer: Self-pay | Admitting: Cardiology

## 2013-11-13 LAB — GLUCOSE, CAPILLARY
Glucose-Capillary: 140 mg/dL — ABNORMAL HIGH (ref 70–99)
Glucose-Capillary: 83 mg/dL (ref 70–99)
Glucose-Capillary: 138 mg/dL — ABNORMAL HIGH (ref 70–99)
Glucose-Capillary: 162 mg/dL — ABNORMAL HIGH (ref 70–99)

## 2013-11-13 LAB — CATH TIP CULTURE: Culture: NO GROWTH

## 2013-11-13 MED ORDER — SODIUM CHLORIDE 0.9 % IJ SOLN: 10.0000 mL | INTRAMUSCULAR | Status: DC | PRN

## 2013-11-13 MED ORDER — CEFAZOLIN SODIUM-DEXTROSE 2-3 GM-% IV SOLR
2.0000 g | Freq: Three times a day (TID) | INTRAVENOUS | Status: DC
Start: 2013-11-13 — End: 2014-01-31

## 2013-11-13 NOTE — Progress Notes (Signed)
OT Cancellation Note  Patient Details Name: Kadin Canipe MRN: [id number] DOB: Aug 16, 1940   Cancelled Treatment:    Reason Eval/Treat Not Completed: Other (comment) checked earlier, and pt in sterile procedure.  Benito Mccreedy OTR/L [phone] 11/13/2013, 1:16 PM

## 2013-11-13 NOTE — Progress Notes (Signed)
Spoke to IV team who said patient does not have to have a flush before he leaves.

## 2013-11-13 NOTE — Discharge Instructions (Signed)
Call Dr. Christella Noa  if Temperature is >101.5, if the wound drains fluid, if the wound becomes red and or tender to touch.  Call the infectious disease service if you have questions about the antibiotic, administration of the antibiotic, and for any appointments you need with them You may shower, the wound may get wet, just pat dry with a towel after showering

## 2013-11-13 NOTE — Progress Notes (Addendum)
Called IV team to make sure patient was on their list to receive a PICC line today. IV team said he was not and that there was no "insert PICC" order. I checked the orders and this was true. IV team said they are unsure if they will be able to insert PICC today, that generally they need 24 hours. MD on call paged to get order for PICC ASAP so that hopefully he might get PICC this afternoon and can be discharged. Will cont to monitor  Update 0824: order for PICC line received from Dr Christella Noa, IV team paged that order is now in

## 2013-11-13 NOTE — Progress Notes (Signed)
[facility]   Ancef day # 7  Subjective: Afebrile, underwent TEE without complication, no vegetations. Had picc line placed on right arm without difficulty   Antibiotics:  Anti-infectives   Start     Dose/Rate Route Frequency Ordered Stop   11/13/13 0000  ceFAZolin (ANCEF) 2-3 GM-% SOLR     2 g 100 mL/hr over 30 Minutes Intravenous Every 8 hours 11/13/13 0916     11/07/13 1800  ceFAZolin (ANCEF) IVPB 2 g/50 mL premix     2 g 100 mL/hr over 30 Minutes Intravenous 3 times per day 11/07/13 1716     11/06/13 2100  ceFAZolin (ANCEF) IVPB 2 g/50 mL premix  Status:  Discontinued     2 g 100 mL/hr over 30 Minutes Intravenous 3 times per day 11/06/13 1532 11/06/13 1534   11/06/13 2000  nafcillin 2 g in dextrose 5 % 50 mL IVPB  Status:  Discontinued     2 g 100 mL/hr over 30 Minutes Intravenous 6 times per day 11/06/13 1534 11/07/13 1716   11/04/13 1000  acyclovir (ZOVIRAX) tablet 800 mg  Status:  Discontinued     800 mg Oral Daily 11/04/13 0249 11/08/13 1105   11/04/13 1000  vancomycin (VANCOCIN) 1,250 mg in sodium chloride 0.9 % 250 mL IVPB  Status:  Discontinued     1,250 mg 166.7 mL/hr over 90 Minutes Intravenous Every 12 hours 11/04/13 0309 11/06/13 1532   11/04/13 0400  metroNIDAZOLE (FLAGYL) IVPB 500 mg  Status:  Discontinued     500 mg 100 mL/hr over 60 Minutes Intravenous Every 8 hours 11/04/13 0302 11/06/13 1532   11/04/13 0315  cefTAZidime (FORTAZ) 2 g in dextrose 5 % 50 mL IVPB  Status:  Discontinued     2 g 100 mL/hr over 30 Minutes Intravenous 3 times per day 11/04/13 0309 11/06/13 1532   11/03/13 2018  vancomycin (VANCOCIN) 1 GM/200ML IVPB    Comments:  Haynes Bast   : cabinet override      11/03/13 2018 11/04/13 0829      Medications: Scheduled Meds: . amLODipine  5 mg Oral Daily  . aspirin EC  81 mg Oral Daily  . atorvastatin  20 mg Oral q1800  .  ceFAZolin (ANCEF) IV  2 g Intravenous 3 times per day  . glyBURIDE  5 mg Oral BID  WC  . insulin aspart  0-15 Units Subcutaneous TID WC  . metoprolol  50 mg Oral BID  . senna  1 tablet Oral BID  . sertraline  50 mg Oral Daily  . sodium chloride  3 mL Intravenous Q12H  . tamsulosin  0.4 mg Oral Daily  . traZODone  100 mg Oral QHS  . venlafaxine  25 mg Oral TID   Continuous Infusions: . sodium chloride 10 mL/hr at 11/05/13 1600  . sodium chloride    . sodium chloride     PRN Meds:.acetaminophen, alum & mag hydroxide-simeth, diazepam, HYDROcodone-acetaminophen, menthol-cetylpyridinium, morphine injection, nitroGLYCERIN, ondansetron (ZOFRAN) IV, phenol, polyethylene glycol, sodium chloride, sodium chloride, sodium chloride    Objective: Weight change:   Intake/Output Summary (Last 24 hours) at 11/13/13 1317 Last data filed at 11/13/13 0900  Gross per 24 hour  Intake    240 ml  Output    625 ml  Net   -385 ml   Blood pressure 138/72, pulse 79, temperature 98.7 F (37.1 C), temperature source Oral, resp. rate 18, height 6\' 2"  (1.88 m), weight 199  lb 1.2 oz (90.3 kg), SpO2 98.00%. Temp:  [98.1 F (36.7 C)-98.9 F (37.2 C)] 98.7 F (37.1 C) (06/09 0955) Pulse Rate:  [73-86] 79 (06/09 0955) Resp:  [10-18] 18 (06/09 0955) BP: (131-177)/(61-78) 138/72 mmHg (06/09 0955) SpO2:  [92 %-98 %] 98 % (06/09 0955)  Physical Exam: General: Alert and awake, oriented x3, not in any acute distress.  CV: RRR no mgr Pulm  CTAB no w, r, r GI: soft nd, nt, +bs Neuro: nonfocal Skin: surgical site with staples is clean on neck  CBC:  Recent Labs Lab 11/07/13 1120 11/12/13 1050 11/12/13 1718  HGB 10.9* TEST REQUEST RECEIVED WITHOUT APPROPRIATE SPECIMEN 10.6*  HCT 32.0* TEST REQUEST RECEIVED WITHOUT APPROPRIATE SPECIMEN 31.9*  PLT 469* TEST REQUEST RECEIVED WITHOUT APPROPRIATE SPECIMEN 574*      Micro Results: Recent Results (from the past 240 hour(s))  CULTURE, ROUTINE-ABSCESS     Status: None   Collection Time    11/03/13 10:09 PM      Result Value Ref Range  Status   Specimen Description ABSCESS NECK   Final   Special Requests CERVICAL AND THORACIC EPIDURAL   Final   Gram Stain     Final   Value: MODERATE WBC PRESENT, PREDOMINANTLY PMN     RARE SQUAMOUS EPITHELIAL CELLS PRESENT     MODERATE GRAM POSITIVE COCCI IN PAIRS     Gram Stain Report Called to,Read Back By and Verified With: Gram Stain Report Called to,Read Back By and Verified With: RASHEEZA IN LAB @12363AM  BY [facility] 11/04/13     Performed at [facility]   Culture     Final   Value: MODERATE STAPHYLOCOCCUS AUREUS     Note: RIFAMPIN AND GENTAMICIN SHOULD NOT BE USED AS SINGLE DRUGS FOR TREATMENT OF STAPH INFECTIONS.     Performed at [facility]   Report Status 11/06/2013 FINAL   Final   Organism ID, Bacteria STAPHYLOCOCCUS AUREUS   Final  ANAEROBIC CULTURE     Status: None   Collection Time    11/03/13 10:09 PM      Result Value Ref Range Status   Specimen Description ABSCESS NECK   Final   Special Requests CERVICAL AND THORACIC EPIDURAL   Final   Gram Stain     Final   Value: MODERATE WBC PRESENT, PREDOMINANTLY PMN     RARE SQUAMOUS EPITHELIAL CELLS PRESENT     MODERATE GRAM POSITIVE COCCI IN PAIRS     Gram Stain Report Called to,Read Back By and Verified With: Gram Stain Report Called to,Read Back By and Verified With: RASHEEZA IN LAB @ 1211AM BY [facility] 11/04/13     Performed at [facility]   Culture     Final   Value: NO ANAEROBES ISOLATED     Performed at [facility]   Report Status 11/08/2013 FINAL   Final  GRAM STAIN     Status: None   Collection Time    11/03/13 10:09 PM      Result Value Ref Range Status   Specimen Description ABSCESS NECK   Final   Special Requests CERVICAL AND THORACIC EPIDURAL   Final   Gram Stain     Final   Value: MODERATE WBC PRESENT, PREDOMINANTLY PMN     MODERATE GRAM POSITIVE COCCI IN PAIRS     RARE SQUAMOUS EPITHELIAL CELLS PRESENT     Gram Stain Report Called to,Read Back By and Verified With: Dyane Dustman RN [phone] [place]  GRAM STAIN DONE AT [address] BY JANICE VINCENT   Report Status 11/04/2013 FINAL   Final  CULTURE, ROUTINE-ABSCESS     Status: None   Collection Time    11/03/13 10:16 PM      Result Value Ref Range Status   Specimen Description ABSCESS NECK   Final   Special Requests NO 2 CERVICAL AND THORACIC EPIDURAL   Final   Gram Stain     Final   Value: MODERATE WBC PRESENT, PREDOMINANTLY PMN     RARE SQUAMOUS EPITHELIAL CELLS PRESENT     FEW GRAM POSITIVE COCCI IN PAIRS     Gram Stain Report Called to,Read Back By and Verified With: Gram Stain Report Called to,Read Back By and Verified With: RASHEEZA IN LAB @1235AM  11/04/13 VINCJ     Performed at [facility]   Culture     Final   Value: FEW STAPHYLOCOCCUS AUREUS     Note: SUSCEPTIBILITIES PERFORMED ON PREVIOUS CULTURE WITHIN THE LAST 5 DAYS.     Performed at [facility]   Report Status 11/06/2013 FINAL   Final  GRAM STAIN     Status: None   Collection Time    11/03/13 10:16 PM      Result Value Ref Range Status   Specimen Description ABSCESS NECK   Final   Special Requests CERVICAL AND THORACIC EPIDURAL    Final   Gram Stain     Final   Value: MODERATE WBC PRESENT, PREDOMINANTLY PMN     FEW GRAM POSITIVE COCCI IN PAIRS     RARE SQUAMOUS EPITHELIAL CELLS PRESENT     Gram Stain Report Called to,Read Back By and Verified With: Dyane Dustman RN [phone] [place]   Report Status 11/04/2013 FINAL   Final  TISSUE CULTURE     Status: None   Collection Time    11/03/13 10:17 PM      Result Value Ref Range Status   Specimen Description TISSUE NECK   Final   Special Requests NONE   Final   Gram Stain     Final   Value: RARE WBC PRESENT, PREDOMINANTLY PMN     NO SQUAMOUS EPITHELIAL CELLS SEEN     NO ORGANISMS SEEN     Gram Stain Report Called to,Read Back By and Verified With: Gram Stain Report Called to,Read Back By and Verified With:  RASHEEZA IN LAB @ 1234AM VINCJ 11/04/13     Performed at [facility]   Culture     Final   Value: FEW STAPHYLOCOCCUS AUREUS     Note: RIFAMPIN AND GENTAMICIN SHOULD NOT BE USED AS SINGLE DRUGS FOR TREATMENT OF STAPH INFECTIONS.     Performed at [facility]   Report Status 11/06/2013 FINAL   Final   Organism ID, Bacteria STAPHYLOCOCCUS AUREUS   Final  GRAM STAIN     Status: None   Collection Time    11/03/13 10:17 PM      Result Value Ref Range Status   Specimen Description TISSUE NECK   Final   Special Requests NONE   Final   Gram Stain     Final   Value: RARE WBC PRESENT, PREDOMINANTLY PMN     NO ORGANISMS SEEN     NO SQUAMOUS EPITHELIAL CELLS SEEN     Gram Stain Report Called to,Read Back By and Verified With: Dyane Dustman RN [phone]  [place]     GRAM STAIN DONE AT [address] BY JANICE VINCENT   Report Status 11/04/2013 FINAL   Final  MRSA PCR SCREENING     Status: None   Collection Time    11/04/13  2:13 AM      Result Value Ref Range Status   MRSA by PCR NEGATIVE  NEGATIVE Final   Comment:            The GeneXpert MRSA Assay (FDA     approved for NASAL specimens     only), is one component of a     comprehensive MRSA colonization     surveillance program. It is not     intended to diagnose MRSA     infection nor to guide or     monitor treatment for     MRSA infections.  CULTURE, BLOOD (ROUTINE X 2)     Status: None   Collection Time    11/09/13 11:00 AM      Result Value Ref Range Status   Specimen Description BLOOD RIGHT ANTECUBITAL   Final   Special Requests BOTTLES DRAWN AEROBIC AND ANAEROBIC 10CC   Final   Culture  Setup Time     Final   Value: 11/10/2013 06:21     Performed at [facility]   Culture     Final   Value:        BLOOD CULTURE RECEIVED NO GROWTH TO DATE CULTURE WILL BE HELD FOR 5 DAYS BEFORE ISSUING A FINAL NEGATIVE REPORT     Performed at [facility]   Report Status PENDING   Incomplete  CULTURE, BLOOD  (ROUTINE X 2)     Status: None   Collection Time    11/09/13 11:20 AM      Result Value Ref Range Status   Specimen Description BLOOD LEFT HAND   Final   Special Requests BOTTLES DRAWN AEROBIC ONLY [place]   Final   Culture  Setup Time     Final   Value: 11/10/2013 06:21     Performed at [facility]   Culture     Final   Value:        BLOOD CULTURE RECEIVED NO GROWTH TO DATE CULTURE WILL BE HELD FOR 5 DAYS BEFORE ISSUING A FINAL NEGATIVE REPORT     Performed at [facility]   Report Status PENDING   Incomplete  CATH TIP CULTURE     Status: None   Collection Time    11/10/13 11:20 AM      Result Value Ref Range Status   Specimen Description CATH TIP   Final   Special Requests NONE   Final   Culture     Final   Value: NO GROWTH 2 DAYS     Performed at [facility]   Report Status 11/13/2013 FINAL   Final  CULTURE, BLOOD (ROUTINE X 2)     Status: None   Collection Time    11/10/13  5:20 PM      Result Value Ref Range Status   Specimen Description BLOOD LEFT ARM   Final   Special Requests BOTTLES DRAWN AEROBIC AND ANAEROBIC [facility]   Final   Culture  Setup Time     Final   Value: 11/11/2013 01:04     Performed at [facility]   Culture     Final   Value:        BLOOD CULTURE RECEIVED NO  GROWTH TO DATE CULTURE WILL BE HELD FOR 5 DAYS BEFORE ISSUING A FINAL NEGATIVE REPORT     Performed at [facility]   Report Status PENDING   Incomplete  CULTURE, BLOOD (ROUTINE X 2)     Status: None   Collection Time    11/10/13  5:25 PM      Result Value Ref Range Status   Specimen Description BLOOD LEFT HAND   Final   Special Requests BOTTLES DRAWN AEROBIC ONLY 6CC   Final   Culture  Setup Time     Final   Value: 11/11/2013 01:04     Performed at [facility]   Culture     Final   Value:        BLOOD CULTURE RECEIVED NO GROWTH TO DATE CULTURE WILL BE HELD FOR 5 DAYS BEFORE ISSUING A FINAL NEGATIVE REPORT     Performed at [facility]    Report Status PENDING   Incomplete  CULTURE, BLOOD (ROUTINE X 2)     Status: None   Collection Time    11/11/13  6:30 AM      Result Value Ref Range Status   Specimen Description BLOOD LEFT ARM   Final   Special Requests BOTTLES DRAWN AEROBIC AND ANAEROBIC 10CC EACH   Final   Culture  Setup Time     Final   Value: 11/11/2013 13:46     Performed at [facility]   Culture     Final   Value:        BLOOD CULTURE RECEIVED NO GROWTH TO DATE CULTURE WILL BE HELD FOR 5 DAYS BEFORE ISSUING A FINAL NEGATIVE REPORT     Performed at [facility]   Report Status PENDING   Incomplete  CULTURE, BLOOD (ROUTINE X 2)     Status: None   Collection Time    11/11/13  6:45 AM      Result Value Ref Range Status   Specimen Description BLOOD LEFT HAND   Final   Special Requests BOTTLES DRAWN AEROBIC AND ANAEROBIC 10CC EACH   Final   Culture  Setup Time     Final   Value: 11/11/2013 13:45     Performed at [facility]   Culture     Final   Value:        BLOOD CULTURE RECEIVED NO GROWTH TO DATE CULTURE WILL BE HELD FOR 5 DAYS BEFORE ISSUING A FINAL NEGATIVE REPORT     Performed at [facility]   Report Status PENDING   Incomplete    Studies/Results: No results found.    Assessment/Plan:  Active Problems:   Abscess in epidural space of cervical spine   Sepsis(995.91)   Atelectasis   Hypoxemia    Michael Morton is a 72 y.o. male with  MSSA bacteremia and epidural, Cervical-thoracic region sp Neurosurgery.  #1 MSSA Bacteremia:  -- endocarditis has been ruled out --would treat with 6-8 weeks of IV antibiotics with day # 1 as June 6th, 2015 - he will need weekly cbc and bmp with home health also doing weekly picc line dressing care - can be ok for discharge tomorrow  - appreciate having him stay for thorough evaluation of mssa bacteremia  #2 Epidural abscess in C, T spine s/p evacuation/debridement --as above would treat for 6-8 weeks of IV abx  #3 elevated  platelets - likely sign of inflammation. Discussed with patient that if he has any swelling, redness, and  drainage from surgical site, he should contact nsgy office for evaluation      LOS: 10 days   Carlyle Basques 11/13/2013, 1:17 PM

## 2013-11-15 NOTE — Discharge Summary (Signed)
Physician Discharge Summary  Patient ID: Michael Morton MRN: [id number] DOB/AGE: [birth year] 73 y.o.  Admit date: 11/03/2013 Discharge date: 11/15/2013  Admission Diagnoses:Cervicothoracic epidural spinal abscess  Discharge Diagnoses:  Active Problems:   Abscess in epidural space of cervical spine   Sepsis(995.91)   Atelectasis   Hypoxemia   Discharged Condition: good  Hospital Course: Mr. Foxworth was admitted via the emergency room with a history of severe pain in the shoulders, neck and arms. MRI showed an epidural abscess in the cervicothoracic spine. I took him to the operating room emergently for a laminectomy for abscess drainage. Post op he grew staphylococcus aureus, and had blood cultures taken on the day of admission which was positive for Staph also. He was placed on appropriate antibiotics and had a picc line placed. That picc line was then removed since it was placed before the positive blood cultures had returned. Thus a new picc was placed on the day of discharge. He will return to see me for staple removal in ~ one week. His wound at discharge is clean, dry, and without signs of infection. He will be on Abx for at least 6 weeks, directed by the ID service.   Consults: Infectious disease  Significant Diagnostic Studies: MRI  Treatments: surgery: as above  Discharge Exam: Blood pressure 138/72, pulse 79, temperature 98.7 F (37.1 C), temperature source Oral, resp. rate 18, height 6\' 2"  (1.88 m), weight 90.3 kg (199 lb 1.2 oz), SpO2 98.00%. General appearance: alert, cooperative, appears stated age and no distress Neurologic: Alert and oriented X 3, normal strength and tone. Normal symmetric reflexes. Normal coordination and gait  Disposition: 01-Home or Self Care     Medication List    STOP taking these medications       amoxicillin-clavulanate 875-125 MG per tablet  Commonly known as:  AUGMENTIN      TAKE these medications       acyclovir 400 MG tablet   Commonly known as:  ZOVIRAX  Take 800 mg by mouth daily.     amLODipine 5 MG tablet  Commonly known as:  NORVASC  Take 5 mg by mouth daily.     aspirin 81 MG tablet  Take 325 mg by mouth daily.     ceFAZolin 2-3 GM-% Solr  Commonly known as:  ANCEF  Inject 50 mLs (2 g total) into the vein every 8 (eight) hours. X 6 wk. Using 11/10/13 as day 1 of 42     etodolac 400 MG tablet  Commonly known as:  LODINE  Take 400 mg by mouth 2 (two) times daily.     glyBURIDE 5 MG tablet  Commonly known as:  DIABETA  Take 5 mg by mouth 2 (two) times daily with a meal.     HYDROcodone-acetaminophen 5-325 MG per tablet  Commonly known as:  NORCO/VICODIN  Take 1-2 tablets by mouth every 6 (six) hours as needed.     meloxicam 7.5 MG tablet  Commonly known as:  MOBIC  Take 1 tablet (7.5 mg total) by mouth daily.     methocarbamol 500 MG tablet  Commonly known as:  ROBAXIN  Take 1 tablet (500 mg total) by mouth 2 (two) times daily.     metoprolol 50 MG tablet  Commonly known as:  LOPRESSOR  Take 50 mg by mouth 2 (two) times daily.     nitroGLYCERIN 0.4 MG SL tablet  Commonly known as:  NITROSTAT  Place 0.4 mg under the tongue every 5 (five) minutes  as needed for chest pain.     sertraline 50 MG tablet  Commonly known as:  ZOLOFT  Take 50 mg by mouth daily.     simvastatin 40 MG tablet  Commonly known as:  ZOCOR  Take 40 mg by mouth every evening.     tamsulosin 0.4 MG Caps capsule  Commonly known as:  FLOMAX  Take 1 capsule (0.4 mg total) by mouth daily.     traZODone 50 MG tablet  Commonly known as:  DESYREL  Take 100 mg by mouth at bedtime.     venlafaxine 50 MG tablet  Commonly known as:  EFFEXOR  Take 25 mg by mouth 3 (three) times daily.           Follow-up Information   Follow up with Zylee Marchiano L, MD In 1 week. (call to make an appointment)    Specialty:  Neurosurgery   Contact information:   [place]  [address] [phone]        Signed: Kimyata Milich L 11/15/2013, 4:26 PM

## 2013-11-16 LAB — CULTURE, BLOOD (ROUTINE X 2)
CULTURE: NO GROWTH
Culture: NO GROWTH

## 2013-11-17 LAB — CULTURE, BLOOD (ROUTINE X 2)
Culture: NO GROWTH
Culture: NO GROWTH
Culture: NO GROWTH
Culture: NO GROWTH

## 2013-11-30 NOTE — OR Nursing (Signed)
Addendum to scope page 

## 2013-12-06 ENCOUNTER — Encounter: Payer: Self-pay | Admitting: Internal Medicine

## 2013-12-06 ENCOUNTER — Ambulatory Visit (INDEPENDENT_AMBULATORY_CARE_PROVIDER_SITE_OTHER): Payer: Medicare Other | Admitting: Internal Medicine

## 2013-12-06 VITALS — BP 131/75 | HR 75 | Temp 97.8°F | Wt 194.0 lb

## 2013-12-06 DIAGNOSIS — A4901 Methicillin susceptible Staphylococcus aureus infection, unspecified site: Secondary | ICD-10-CM

## 2013-12-06 NOTE — Progress Notes (Signed)
PICC dressing changed.  Old dressing removed.  PICC site unremarkable with no sutures in place.  Area cleansed with chlorhexidine , stabilization device and hub  Replaced. Semipermeable dressing applied.  Date, time and initials applied to dressing.    Per Dr Baxter Flattery patient will be returning in one week for dressing change and labs.   Laverle Patter, RN

## 2013-12-06 NOTE — Progress Notes (Signed)
Subjective:    Patient ID: Michael Morton, male    DOB: 03-17-1941, 73 y.o.   MRN: [id number]  HPI Michael Morton is a 73 y.o. male with PMH relevant for DM, CAD post CABG, HTN, HLD presented  on 5/30 with two weeks history of weakness, severe pain in his shoulders, arms, back and neck with urinary incontinence and right arm paresthesia in additiion chills and diaphoresis but no clear history of fever. He underwent MRI of the spine revealed a c5-t4 epidural abscess. Cabbell performed posterior cervical and thoracic evacuation of the epidural abscess as well as c6 and t4 laminectomies, pus under pressure noted in OR note on 5/30. OR cultures isolated MSSA which was also found in his blood cx. He was placed on empiric regimen of vanco, ceftaz, metronidazole plus acyclovir. Underwent TEE which was negative for endocarditis. He had picc line replaced after documented clearance of bacteremia. He was sent home on cefazolin 2gm IV q 8hr using June 6th as day #1 for 6 wks. Currently on day 27 of 42. He is doing well with antibiotics but noticed having candidal skin infection of the groin area. Saw his pcp who gave him fluconazole.  Allergies  Allergen Reactions  . Ace Inhibitors Other (See Comments)    Unknown   . Oxycodone Other (See Comments)    Patient states it makes him feel disoriented   Current Outpatient Prescriptions on File Prior to Visit  Medication Sig Dispense Refill  . acyclovir (ZOVIRAX) 400 MG tablet Take 800 mg by mouth daily.       Marland Kitchen amLODipine (NORVASC) 5 MG tablet Take 5 mg by mouth daily.      Marland Kitchen aspirin 81 MG tablet Take 325 mg by mouth daily.       Marland Kitchen ceFAZolin (ANCEF) 2-3 GM-% SOLR Inject 50 mLs (2 g total) into the vein every 8 (eight) hours. X 6 wk. Using 11/10/13 as day 1 of 42  21 each  5  . etodolac (LODINE) 400 MG tablet Take 400 mg by mouth 2 (two) times daily.      Marland Kitchen glyBURIDE (DIABETA) 5 MG tablet Take 5 mg by mouth 2 (two) times daily with a meal.      .  HYDROcodone-acetaminophen (NORCO/VICODIN) 5-325 MG per tablet Take 1-2 tablets by mouth every 6 (six) hours as needed.  15 tablet  0  . meloxicam (MOBIC) 7.5 MG tablet Take 1 tablet (7.5 mg total) by mouth daily.  10 tablet  0  . methocarbamol (ROBAXIN) 500 MG tablet Take 1 tablet (500 mg total) by mouth 2 (two) times daily.  20 tablet  0  . metoprolol (LOPRESSOR) 50 MG tablet Take 50 mg by mouth 2 (two) times daily.      . nitroGLYCERIN (NITROSTAT) 0.4 MG SL tablet Place 0.4 mg under the tongue every 5 (five) minutes as needed for chest pain.      Marland Kitchen sertraline (ZOLOFT) 50 MG tablet Take 50 mg by mouth daily.      . simvastatin (ZOCOR) 40 MG tablet Take 40 mg by mouth every evening.      . tamsulosin (FLOMAX) 0.4 MG CAPS capsule Take 1 capsule (0.4 mg total) by mouth daily.  7 capsule  0  . traZODone (DESYREL) 50 MG tablet Take 100 mg by mouth at bedtime.       Marland Kitchen venlafaxine (EFFEXOR) 50 MG tablet Take 25 mg by mouth 3 (three) times daily.       No current facility-administered  medications on file prior to visit.      Review of Systems Has had candida skin infection in groin area. Started diflucan by pcp. Other 10 point ros is negatvie    Objective:   Physical Exam BP 131/75  Pulse 75  Temp(Src) 97.8 F (36.6 C) (Oral)  Wt 194 lb (87.998 kg) Physical Exam  Constitutional: He is oriented to person, place, and time. He appears well-developed and well-nourished. No distress.  HENT:  Mouth/Throat: Oropharynx is clear and moist. No oropharyngeal exudate.  Skin: Skin is warm and dry. No rash noted. No erythema. Right arm picc line small erythema at entrance site. Surgical site infection along spine is well healed. Ext: right great toe has lateral pressure ulcer being debrided. Wound bed is clean Psychiatric: He has a normal mood and affect. His behavior is normal.         Assessment & Plan:  mssa epidural abscess surgical site infection s/p debridement = continue on cefazolin 2gm IV  q 8hr until July 17th. Will need to come to clinic for weekly picc line dressing changes and weekly cbc and bmp x 2. Will change dressing today.  rtc on July 17th

## 2013-12-13 ENCOUNTER — Ambulatory Visit (INDEPENDENT_AMBULATORY_CARE_PROVIDER_SITE_OTHER): Payer: Medicare Other | Admitting: *Deleted

## 2013-12-13 DIAGNOSIS — A4901 Methicillin susceptible Staphylococcus aureus infection, unspecified site: Secondary | ICD-10-CM

## 2013-12-13 LAB — CBC WITH DIFFERENTIAL/PLATELET
Basophils Absolute: 0.1 10*3/uL (ref 0.0–0.1)
Basophils Relative: 1 % (ref 0–1)
EOS ABS: 0.3 10*3/uL (ref 0.0–0.7)
EOS PCT: 5 % (ref 0–5)
HEMATOCRIT: 37 % — AB (ref 39.0–52.0)
Hemoglobin: 12.2 g/dL — ABNORMAL LOW (ref 13.0–17.0)
LYMPHS ABS: 1.6 10*3/uL (ref 0.7–4.0)
Lymphocytes Relative: 32 % (ref 12–46)
MCH: 31.5 pg (ref 26.0–34.0)
MCHC: 33 g/dL (ref 30.0–36.0)
MCV: 95.6 fL (ref 78.0–100.0)
MONO ABS: 0.5 10*3/uL (ref 0.1–1.0)
Monocytes Relative: 10 % (ref 3–12)
Neutro Abs: 2.6 10*3/uL (ref 1.7–7.7)
Neutrophils Relative %: 52 % (ref 43–77)
PLATELETS: 228 10*3/uL (ref 150–400)
RBC: 3.87 MIL/uL — AB (ref 4.22–5.81)
RDW: 14.7 % (ref 11.5–15.5)
WBC: 5 10*3/uL (ref 4.0–10.5)

## 2013-12-13 LAB — BASIC METABOLIC PANEL WITH GFR
BUN: 22 mg/dL (ref 6–23)
CALCIUM: 9.1 mg/dL (ref 8.4–10.5)
CO2: 24 meq/L (ref 19–32)
Chloride: 104 mEq/L (ref 96–112)
Creat: 1.02 mg/dL (ref 0.50–1.35)
GFR, Est African American: 85 mL/min
GFR, Est Non African American: 74 mL/min
GLUCOSE: 206 mg/dL — AB (ref 70–99)
Potassium: 4.7 mEq/L (ref 3.5–5.3)
SODIUM: 136 meq/L (ref 135–145)

## 2013-12-13 NOTE — Patient Instructions (Signed)
Return next Thursday for dressing change and lab draw.

## 2013-12-13 NOTE — Progress Notes (Signed)
Lab draw from PICC for CMET and CBC.  Patient identified with name and DOB. Donned gloves. Cleaned tubing/hub connection cap with CHG wipe for 20 seconds, using scrubbing motion. Attached empty, sterile 20 cc syringe, opened clamp, withdrew 10 cc of waste and set aside. Attached next syringe and withdrew 14 cc of blood, transferred to lab tubes.  Received order for sterile dressing change to right arm PICC site.  Removed old dressing and discarded.  Replaced PICC dressing using sterile technique.  PICC site unremarkable.  PICC tubing has migrated approximately 4 CM.  Pt tolerated dressing change without complaint.

## 2013-12-18 ENCOUNTER — Inpatient Hospital Stay: Payer: Medicare Other | Admitting: Internal Medicine

## 2013-12-20 ENCOUNTER — Ambulatory Visit (INDEPENDENT_AMBULATORY_CARE_PROVIDER_SITE_OTHER): Payer: Medicare Other | Admitting: *Deleted

## 2013-12-20 ENCOUNTER — Telehealth: Payer: Self-pay | Admitting: *Deleted

## 2013-12-20 DIAGNOSIS — A419 Sepsis, unspecified organism: Secondary | ICD-10-CM

## 2013-12-20 DIAGNOSIS — A4901 Methicillin susceptible Staphylococcus aureus infection, unspecified site: Secondary | ICD-10-CM

## 2013-12-20 LAB — BASIC METABOLIC PANEL WITH GFR
BUN: 20 mg/dL (ref 6–23)
CO2: 23 mEq/L (ref 19–32)
CREATININE: 1.01 mg/dL (ref 0.50–1.35)
Calcium: 8.8 mg/dL (ref 8.4–10.5)
Chloride: 103 mEq/L (ref 96–112)
GFR, EST AFRICAN AMERICAN: 86 mL/min
GFR, EST NON AFRICAN AMERICAN: 74 mL/min
Glucose, Bld: 195 mg/dL — ABNORMAL HIGH (ref 70–99)
Potassium: 4.9 mEq/L (ref 3.5–5.3)
Sodium: 135 mEq/L (ref 135–145)

## 2013-12-20 LAB — CBC WITH DIFFERENTIAL/PLATELET
BASOS ABS: 0 10*3/uL (ref 0.0–0.1)
BASOS PCT: 1 % (ref 0–1)
EOS ABS: 0.2 10*3/uL (ref 0.0–0.7)
EOS PCT: 6 % — AB (ref 0–5)
HEMATOCRIT: 35.4 % — AB (ref 39.0–52.0)
Hemoglobin: 12 g/dL — ABNORMAL LOW (ref 13.0–17.0)
Lymphocytes Relative: 34 % (ref 12–46)
Lymphs Abs: 1.3 10*3/uL (ref 0.7–4.0)
MCH: 31.7 pg (ref 26.0–34.0)
MCHC: 33.9 g/dL (ref 30.0–36.0)
MCV: 93.7 fL (ref 78.0–100.0)
MONOS PCT: 16 % — AB (ref 3–12)
Monocytes Absolute: 0.6 10*3/uL (ref 0.1–1.0)
Neutro Abs: 1.7 10*3/uL (ref 1.7–7.7)
Neutrophils Relative %: 43 % (ref 43–77)
Platelets: 176 10*3/uL (ref 150–400)
RBC: 3.78 MIL/uL — ABNORMAL LOW (ref 4.22–5.81)
RDW: 14.7 % (ref 11.5–15.5)
WBC: 3.9 10*3/uL — ABNORMAL LOW (ref 4.0–10.5)

## 2013-12-20 NOTE — Patient Instructions (Signed)
MD will call pt when today's lab results are available to determine when PICC can be removed.

## 2013-12-20 NOTE — Progress Notes (Signed)
Lab draw from PICC for CRP and Sed Rate.  Patient identified with name and DOB. Donned gloves. Cleaned tubing/hub connection cap with CHG wipe for 20 seconds, using scrubbing motion. Attached empty, sterile 10 cc syringe, opened clamp, withdrew 10 cc of waste and set aside. Attached next syringe and withdrew 10 cc of blood, transferred to lab tube.  Sterile Dressing change to right upper arm PICC site.  Reinforced the dressing due to pt having increased activity and dressing had been reinforced by his wife.  Pt tolerated dressing change

## 2013-12-20 NOTE — Telephone Encounter (Signed)
Stop Date for antibiotic is July 17, equals 6 weeks.  Lab draw this morning at [facility], Sed rate and CRP.  Continue antibiotics until f/u OV w/ Dr. Baxter Flattery on 12/25/13?  MD please advise.

## 2013-12-20 NOTE — Addendum Note (Signed)
Addended byMarlis Edelson on: 12/20/2013 11:29 AM   Modules accepted: Orders

## 2013-12-25 ENCOUNTER — Encounter: Payer: Self-pay | Admitting: Internal Medicine

## 2013-12-25 ENCOUNTER — Ambulatory Visit (INDEPENDENT_AMBULATORY_CARE_PROVIDER_SITE_OTHER): Payer: Medicare Other | Admitting: Internal Medicine

## 2013-12-25 VITALS — BP 138/78 | HR 79 | Temp 97.2°F | Wt 188.0 lb

## 2013-12-25 DIAGNOSIS — B372 Candidiasis of skin and nail: Secondary | ICD-10-CM

## 2013-12-25 DIAGNOSIS — A4901 Methicillin susceptible Staphylococcus aureus infection, unspecified site: Secondary | ICD-10-CM

## 2013-12-25 DIAGNOSIS — G061 Intraspinal abscess and granuloma: Secondary | ICD-10-CM

## 2013-12-25 LAB — BASIC METABOLIC PANEL WITH GFR
BUN: 26 mg/dL — ABNORMAL HIGH (ref 6–23)
CO2: 26 mEq/L (ref 19–32)
CREATININE: 1.05 mg/dL (ref 0.50–1.35)
Calcium: 8.9 mg/dL (ref 8.4–10.5)
Chloride: 103 mEq/L (ref 96–112)
GFR, EST NON AFRICAN AMERICAN: 71 mL/min
GFR, Est African American: 82 mL/min
Glucose, Bld: 152 mg/dL — ABNORMAL HIGH (ref 70–99)
Potassium: 4.9 mEq/L (ref 3.5–5.3)
Sodium: 136 mEq/L (ref 135–145)

## 2013-12-25 LAB — C-REACTIVE PROTEIN: CRP: <0.5 mg/dL (ref <0.60)

## 2013-12-25 MED ORDER — FLUCONAZOLE 200 MG PO TABS: 200.0000 mg | ORAL_TABLET | Freq: Every day | ORAL | Status: DC

## 2013-12-25 NOTE — Progress Notes (Signed)
Subjective:    Patient ID: Michael Morton, male    DOB: August 17, 1940, 73 y.o.   MRN: [id number]  HPI 73yo M with MSSA bacteremia and multilevel cervical spine epidural abscess s/p evacuation finishing 6 wk of IV antibiotics today.   Michael Morton is a 73 y.o. male with PMH relevant for DM, CAD post CABG, HTN, HLD presented on 5/30 with two weeks history of weakness, severe pain in his shoulders, arms, back and neck with urinary incontinence and right arm paresthesia. He underwent MRI of the spine revealed a c5-t4 epidural abscess. Cabbell performed posterior cervical and thoracic evacuation of the epidural abscess as well as c6 and t4 laminectomies, pus under pressure noted in OR note on 5/30. OR cultures isolated MSSA as well as blood cx.  Underwent TEE which was negative for endocarditis. He had picc line replaced after documented clearance of bacteremia. He was sent home on cefazolin 2gm IV q 8hr using June 6th as day #1 for 6 wks to end today. He reports that only difficulty with antibiotics is the occurrence of fungal infection in groin area which he uses antifungal powder. He denies diarrhea, or pain at picc line site.  Current Outpatient Prescriptions on File Prior to Visit  Medication Sig Dispense Refill  . acyclovir (ZOVIRAX) 400 MG tablet Take 800 mg by mouth daily.       Marland Kitchen amLODipine (NORVASC) 5 MG tablet Take 5 mg by mouth daily.      Marland Kitchen aspirin 81 MG tablet Take 325 mg by mouth daily.       Marland Kitchen ceFAZolin (ANCEF) 2-3 GM-% SOLR Inject 50 mLs (2 g total) into the vein every 8 (eight) hours. X 6 wk. Using 11/10/13 as day 1 of 42  21 each  5  . etodolac (LODINE) 400 MG tablet Take 400 mg by mouth 2 (two) times daily.      Marland Kitchen glyBURIDE (DIABETA) 5 MG tablet Take 5 mg by mouth 2 (two) times daily with a meal.      . HYDROcodone-acetaminophen (NORCO/VICODIN) 5-325 MG per tablet Take 1-2 tablets by mouth every 6 (six) hours as needed.  15 tablet  0  . meloxicam (MOBIC) 7.5 MG tablet Take 1 tablet (7.5  mg total) by mouth daily.  10 tablet  0  . methocarbamol (ROBAXIN) 500 MG tablet Take 1 tablet (500 mg total) by mouth 2 (two) times daily.  20 tablet  0  . metoprolol (LOPRESSOR) 50 MG tablet Take 50 mg by mouth 2 (two) times daily.      . nitroGLYCERIN (NITROSTAT) 0.4 MG SL tablet Place 0.4 mg under the tongue every 5 (five) minutes as needed for chest pain.      Marland Kitchen sertraline (ZOLOFT) 50 MG tablet Take 50 mg by mouth daily.      . simvastatin (ZOCOR) 40 MG tablet Take 40 mg by mouth every evening.      . tamsulosin (FLOMAX) 0.4 MG CAPS capsule Take 1 capsule (0.4 mg total) by mouth daily.  7 capsule  0  . traZODone (DESYREL) 50 MG tablet Take 100 mg by mouth at bedtime.       Marland Kitchen venlafaxine (EFFEXOR) 50 MG tablet Take 25 mg by mouth 3 (three) times daily.       No current facility-administered medications on file prior to visit.   Active Ambulatory Problems    Diagnosis Date Noted  . Abscess in epidural space of cervical spine 11/04/2013  . Sepsis(995.91) 11/05/2013  . Atelectasis  11/05/2013  . Hypoxemia 11/05/2013   Resolved Ambulatory Problems    Diagnosis Date Noted  . No Resolved Ambulatory Problems   Past Medical History  Diagnosis Date  . Diabetes mellitus without complication   . Hypertension   . Hypercholesteremia   . Coronary artery disease   . Pneumonia     Review of Systems 10 point ros is negative except residual numbness to left shoulder blade area and groin fungal infection that is pruritic    Objective:   Physical Exam BP 138/78  Pulse 79  Temp(Src) 97.2 F (36.2 C) (Oral)  Wt 188 lb (85.276 kg) Physical Exam  Constitutional: He is oriented to person, place, and time. He appears well-developed and well-nourished. No distress.  HENT:  Mouth/Throat: Oropharynx is clear and moist. No oropharyngeal exudate.  Lymphadenopathy:  He has no cervical adenopathy.  Neurological: He is alert and oriented to person, place, and time.  Skin: Skin  Cervical incision  site well healeda Psychiatric: He has a normal mood and affect. His behavior is normal.   Labs: Lab Results  Component Value Date   ESRSEDRATE 90* 11/07/2013   Lab Results  Component Value Date   CRP 15.6* 11/07/2013    Imaging: mri from 11/03/13 Epidural process suspicious for epidural abscess extends from the  C5-6 level to the lower T4 level. This may originate from the right  C5-6 facet joint (septic arthritis). Initially, epidural abscess is  located on the right and as epidural abscess extends inferiorly  traverses into a dorsal location. This causes spinal stenosis and  cord flattening.       Assessment & Plan:  MSSA epidural abscess/ septic arthritis c/b bactermiea- will check inflamm markers today. If still elevated in 60s then will give 2 addn weeks of cefazolin. If trending down appropriately will switch to keflex 500mg  QID. Will defer repeat imaging at this time until he sees Dr. Christella Noa on the 30th.  - candidiasis = can use over the counter lotrimin lotion. gave fluconazole rx  rtc in 4- 6wk

## 2013-12-26 ENCOUNTER — Telehealth: Payer: Self-pay | Admitting: *Deleted

## 2013-12-26 DIAGNOSIS — G061 Intraspinal abscess and granuloma: Secondary | ICD-10-CM

## 2013-12-26 LAB — SEDIMENTATION RATE: SED RATE: 7 mm/h (ref 0–16)

## 2013-12-26 MED ORDER — CEPHALEXIN 500 MG PO CAPS: 500.0000 mg | ORAL_CAPSULE | Freq: Four times a day (QID) | ORAL | Status: DC

## 2013-12-26 NOTE — Telephone Encounter (Signed)
Per Dr Baxter Flattery called the patient to advise that she wants to D/C his PICC and call in an oral antibiotic for him. Dr Baxter Flattery wants him to take Keflex 500 mg 4x daily for 8 weeks and then be re-evaulated. Medication sent to his pharmacy in [place].

## 2013-12-26 NOTE — Telephone Encounter (Signed)
Patient will come in to have his PICC removed tomorrow at 9 am.

## 2013-12-27 ENCOUNTER — Ambulatory Visit (INDEPENDENT_AMBULATORY_CARE_PROVIDER_SITE_OTHER): Payer: Medicare Other | Admitting: *Deleted

## 2013-12-27 DIAGNOSIS — A419 Sepsis, unspecified organism: Secondary | ICD-10-CM

## 2013-12-27 NOTE — Patient Instructions (Signed)
Patient instructed to limit use of arm for 1 hour. Patient instructed that the pressure dressing should remain in place for 24 hours. Patient verbalized understanding of these instructions.

## 2013-12-27 NOTE — Progress Notes (Signed)
RN received verbal order to discontinue the patient's PICC line.  Patient identified with name and date of birth. PICC dressing removed, site unremarkable.    PICC line removed using sterile procedure @ 0910. PICC length equal to that noted in patient's hospital chart of 45 cm. Sterile petroleum gauze + sterile 4X4 applied to PICC site, pressure applied for 10 minutes and covered with Medipore tape as a pressure dressing. Patient tolerated procedure without complaints.  Patient instructed to limit use of arm for 1 hour. Patient instructed that the pressure dressing should remain in place for 24 hours. Patient verbalized understanding of these instructions.

## 2014-01-03 ENCOUNTER — Ambulatory Visit: Payer: Medicare Other

## 2014-01-10 ENCOUNTER — Ambulatory Visit: Payer: Medicare Other

## 2014-01-29 ENCOUNTER — Ambulatory Visit: Payer: Medicare Other | Admitting: Internal Medicine

## 2014-01-31 ENCOUNTER — Ambulatory Visit (INDEPENDENT_AMBULATORY_CARE_PROVIDER_SITE_OTHER): Payer: Medicare Other | Admitting: Internal Medicine

## 2014-01-31 VITALS — BP 139/76 | HR 82 | Temp 98.1°F | Wt 187.0 lb

## 2014-01-31 DIAGNOSIS — G061 Intraspinal abscess and granuloma: Secondary | ICD-10-CM

## 2014-01-31 DIAGNOSIS — A4901 Methicillin susceptible Staphylococcus aureus infection, unspecified site: Secondary | ICD-10-CM

## 2014-01-31 NOTE — Progress Notes (Signed)
Subjective:    Patient ID: Michael Morton, male    DOB: May 11, 1941, 73 y.o.   MRN: [id number]  HPI  Michael Morton is a 73 y.o. male with PMH relevant for DM, CAD post CABG, HTN, HLD presented on 5/30 with two weeks history of weakness, severe pain in his shoulders, arms, back and neck with urinary incontinence and right arm paresthesia. He underwent MRI of the spine revealed a c5-t4 epidural abscess. Cabbell performed posterior cervical and thoracic evacuation of the epidural abscess as well as c6 and t4 laminectomies, pus under pressure noted in OR note on 5/30. OR cultures isolated MSSA as well as blood cx. Underwent TEE which was negative for endocarditis. He had picc line replaced after documented clearance of bacteremia. He was sent home on cefazolin 2gm IV q 8hr x 8 weeks and completed 4 wk of oral cephalexin. His inflammatory markers returned back to baseline. He had repeat mri of spine showed resolution of epidural abscess and signed off from neurosurgery. Doing well. 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 No drainage per patient. No fever, chills, nightsweats. Still recovering for candidal skin infection to groin area. Still not back to his baseline activity but returning slowly into his regular routine.  Lab Results  Component Value Date   ESRSEDRATE 7 12/25/2013   Lab Results  Component Value Date   CRP <0.5 12/25/2013   Allergies  Allergen Reactions  . Ace Inhibitors Other (See Comments)    Unknown   . Oxycodone Other (See Comments)    Patient states it makes him feel disoriented   Current Outpatient Prescriptions on File Prior to Visit  Medication Sig Dispense Refill  . acyclovir (ZOVIRAX) 400 MG tablet Take 800 mg by mouth daily.       Marland Kitchen amLODipine (NORVASC) 5 MG tablet Take 5 mg by mouth daily.      Marland Kitchen aspirin 81 MG tablet Take 325 mg by mouth daily.       . cephALEXin (KEFLEX) 500 MG capsule Take 1 capsule (500 mg total) by mouth 4 (four) times  daily.  120 capsule  1  . etodolac (LODINE) 400 MG tablet Take 400 mg by mouth 2 (two) times daily.      . fluconazole (DIFLUCAN) 200 MG tablet Take 1 tablet (200 mg total) by mouth daily.  10 tablet  0  . glyBURIDE (DIABETA) 5 MG tablet Take 5 mg by mouth 2 (two) times daily with a meal.      . HYDROcodone-acetaminophen (NORCO/VICODIN) 5-325 MG per tablet Take 1-2 tablets by mouth every 6 (six) hours as needed.  15 tablet  0  . meloxicam (MOBIC) 7.5 MG tablet Take 1 tablet (7.5 mg total) by mouth daily.  10 tablet  0  . methocarbamol (ROBAXIN) 500 MG tablet Take 1 tablet (500 mg total) by mouth 2 (two) times daily.  20 tablet  0  . metoprolol (LOPRESSOR) 50 MG tablet Take 50 mg by mouth 2 (two) times daily.      . nitroGLYCERIN (NITROSTAT) 0.4 MG SL tablet Place 0.4 mg under the tongue every 5 (five) minutes as needed for chest pain.      Marland Kitchen sertraline (ZOLOFT) 50 MG tablet Take 50 mg by mouth daily.      . simvastatin (ZOCOR) 40 MG tablet Take 40 mg by mouth every evening.      . tamsulosin (FLOMAX) 0.4 MG CAPS capsule Take 1 capsule (0.4 mg total) by mouth  daily.  7 capsule  0  . traZODone (DESYREL) 50 MG tablet Take 100 mg by mouth at bedtime.       Marland Kitchen venlafaxine (EFFEXOR) 50 MG tablet Take 25 mg by mouth 3 (three) times daily.       No current facility-administered medications on file prior to visit.      Review of Systems     Objective:   Physical Exam BP 139/76  Pulse 82  Temp(Src) 98.1 F (36.7 C) (Oral)  Wt 187 lb (84.823 kg) Physical Exam  Constitutional: He is oriented to person, place, and time. He appears well-developed and well-nourished. No distress.  HENT:  Mouth/Throat: Oropharynx is clear and moist. No oropharyngeal exudate.  Lymphadenopathy:  He has no cervical adenopathy.  Neurological: He is alert and oriented to person, place, and time.  Skin: Skin is warm and dry. No rash noted. No erythema. Cervical surgical incision is well healed Psychiatric: He has a  normal mood and affect. His behavior is normal.       Assessment & Plan:  MSSA epidural abscess and associated bacteremia = has recovered from significant infection. No need for further antibiotics at this time.

## 2018-09-21 ENCOUNTER — Telehealth (INDEPENDENT_AMBULATORY_CARE_PROVIDER_SITE_OTHER): Payer: Medicare Other | Admitting: Internal Medicine

## 2018-09-21 ENCOUNTER — Telehealth: Payer: Self-pay

## 2018-09-21 ENCOUNTER — Other Ambulatory Visit: Payer: Self-pay

## 2018-09-21 VITALS — BP 138/71 | HR 72 | Ht 74.0 in | Wt 199.0 lb

## 2018-09-21 DIAGNOSIS — N183 Chronic kidney disease, stage 3 (moderate): Secondary | ICD-10-CM | POA: Diagnosis not present

## 2018-09-21 DIAGNOSIS — Z950 Presence of cardiac pacemaker: Secondary | ICD-10-CM | POA: Diagnosis not present

## 2018-09-21 DIAGNOSIS — E1122 Type 2 diabetes mellitus with diabetic chronic kidney disease: Secondary | ICD-10-CM | POA: Diagnosis not present

## 2018-09-21 DIAGNOSIS — Z951 Presence of aortocoronary bypass graft: Secondary | ICD-10-CM

## 2018-09-21 DIAGNOSIS — I25118 Atherosclerotic heart disease of native coronary artery with other forms of angina pectoris: Secondary | ICD-10-CM

## 2018-09-21 DIAGNOSIS — Z955 Presence of coronary angioplasty implant and graft: Secondary | ICD-10-CM

## 2018-09-21 DIAGNOSIS — R001 Bradycardia, unspecified: Secondary | ICD-10-CM

## 2018-09-21 NOTE — Patient Instructions (Addendum)
Medication Instructions:  - Your physician recommends that you continue on your current medications as directed. Please refer to the Current Medication list given to you today.  If you need a refill on your cardiac medications before your next appointment, please call your pharmacy.   Lab work: - none ordered  If you have labs (blood work) drawn today and your tests are completely normal, you will receive your results only by: Marland Kitchen MyChart Message (if you have MyChart) OR . A paper copy in the mail If you have any lab test that is abnormal or we need to change your treatment, we will call you to review the results.  Testing/Procedures: - none ordered  Follow-Up: At [facility], you and your health needs are our priority.  As part of our continuing mission to provide you with exceptional heart care, we have created designated Provider Care Teams.  These Care Teams include your primary Cardiologist (physician) and Advanced Practice Providers (APPs -  Physician Assistants and Nurse Practitioners) who all work together to provide you with the care you need, when you need it. . in 3 weeks with Dr. Caryl Comes- Video E- visit  Any Other Special Instructions Will Be Listed Below (If Applicable).  - Please obtain a pulse oximeter to record your heart rates when walking and having chest pain & to record when you on your the bike and not having chest pain - Nira Conn, RN for Dr. Caryl Comes will call in 2 weeks to follow up on your recordings.

## 2018-09-21 NOTE — Telephone Encounter (Signed)
Virtual Visit Pre-Appointment Phone Call    Confirm consent - "In the setting of the current Covid19 crisis, you are schduled for a VIDEO visit with your provider on 09/21/2018) at 8:30.  Just as we do with many in-office visits, in order for you to participate in this visit, we must obtain consent.I can obtain your verbal consent now.  All virtual visits are billed to your insurance company just like a normal visit would be.  By agreeing to a virtual visit, we'd like you to understand that the technology does not allow for your provider to perform an examination, and thus may limit your provider's ability to fully assess your condition.  Finally, though the technology is pretty good, we cannot assure that it will always work on either your or our end, and in the setting of a video visit, we may have to convert it to a phone-only visit.  In either situation, we cannot ensure that we have a secure connection.  Are you willing to proceed?"  YES TELEPHONE CALL NOTE  Michael Morton has been deemed a candidate for a follow-up tele-health visit to limit community exposure during the Covid-19 pandemic. I spoke with the patient via phone to ensure availability of phone/video source, confirm preferred email & phone number, and discuss instructions and expectations.  I reminded Michael Morton to be prepared with any vital sign and/or heart rhythm information that could potentially be obtained via home monitoring, at the time of his visit. I reminded Michael Morton to expect a phone call at the time of his visit if his visit.  Michael Morton, RMA 09/21/2018 9:24 AM        FULL LENGTH CONSENT FOR TELE-HEALTH VISIT   I hereby voluntarily request, consent and authorize [facility] and its employed or contracted physicians, physician assistants, nurse practitioners or other licensed health care professionals (the Practitioner), to provide me with telemedicine health care services (the "Services") as deemed  necessary by the treating Practitioner. I acknowledge and consent to receive the Services by the Practitioner via telemedicine. I understand that the telemedicine visit will involve communicating with the Practitioner through live audiovisual communication technology and the disclosure of certain medical information by electronic transmission. I acknowledge that I have been given the opportunity to request an in-person assessment or other available alternative prior to the telemedicine visit and am voluntarily participating in the telemedicine visit.  I understand that I have the right to withhold or withdraw my consent to the use of telemedicine in the course of my care at any time, without affecting my right to future care or treatment, and that the Practitioner or I may terminate the telemedicine visit at any time. I understand that I have the right to inspect all information obtained and/or recorded in the course of the telemedicine visit and may receive copies of available information for a reasonable fee.  I understand that some of the potential risks of receiving the Services via telemedicine include:  Marland Kitchen Delay or interruption in medical evaluation due to technological equipment failure or disruption; . Information transmitted may not be sufficient (e.g. poor resolution of images) to allow for appropriate medical decision making by the Practitioner; and/or  . In rare instances, security protocols could fail, causing a breach of personal health information.  Furthermore, I acknowledge that it is my responsibility to provide information about my medical history, conditions and care that is complete and accurate to the best of my ability. I acknowledge that Practitioner's  advice, recommendations, and/or decision may be based on factors not within their control, such as incomplete or inaccurate data provided by me or distortions of diagnostic images or specimens that may result from electronic  transmissions. I understand that the practice of medicine is not an exact science and that Practitioner makes no warranties or guarantees regarding treatment outcomes. I acknowledge that I will receive a copy of this consent concurrently upon execution via email to the email address I last provided but may also request a printed copy by calling the office of [place].    I understand that my insurance will be billed for this visit.   I have read or had this consent read to me. . I understand the contents of this consent, which adequately explains the benefits and risks of the Services being provided via telemedicine.  . I have been provided ample opportunity to ask questions regarding this consent and the Services and have had my questions answered to my satisfaction. . I give my informed consent for the services to be provided through the use of telemedicine in my medical care  By participating in this telemedicine visit I agree to the above.

## 2018-09-21 NOTE — Progress Notes (Signed)
Electrophysiology TeleHealth Note   Due to national recommendations of social distancing due to COVID 19, an audio/video telehealth visit is felt to be most appropriate for this patient at this time.  See MyChart message from today for the patient's consent to telehealth for [facility].   Date:  09/21/2018   ID:  Michael Morton, DOB Feb 23, 1941, MRN [id number]  Location: patient's home  Provider location: [address] [place]  Evaluation Performed: Initial Evaluation  PCP:  Maylon Peppers, MD  Cardiologist:  No primary care provider on file. Electrophysiologist:  None   Chief Complaint:  Chest pain following pacemaker insertion  History of Present Illness:    Michael Morton is a 78 y.o. male who presents via audio/video conferencing for a telehealth visit today for  Chest pain post pacemaker insertion      Hx of CAD and CABG 8-10 yrs-- stents 2017   Had issues with lightheadedness and syncope.  ZIO monitoring demonstrated pauses prompting pacemaker implantation 2/20 documented sick sinus syndrome.  Specifics are not available.  No further   Post pacemaker R leg swelling  >> ABI neg but no venous dopplers    DATE TEST EF   1/20  Echo  60-65%   1/20 MYOVIEW  74 % No Ischemia  4/20 LHC   LIMA-LAD; SVG PD patent SVG OM1 & OM2 occluded OM1 and OM2 stents patent        Date Cr K Hgb  4/20 1.85 4.5 15.9         ECG pre and post PM>> RBBB and sinus rates in the 70s  Complaints of chest discomfort somewhat L sided--radiating into back and jaw No SOB or Diaphoresis Relieved with rest--minutes  Not evident while seated and able to exercise on recumbent bicycle  Similar before pacemaker?? But no sure   When takes BP >> pulse 70 almost always    The patient denies symptoms of fevers, chills, cough, or new SOB worrisome for COVID 19.    Past Medical History:  Diagnosis Date  . Coronary artery disease   . Diabetes mellitus without complication   .  Hypercholesteremia   . Hypertension   . Pneumonia     Past Surgical History:  Procedure Laterality Date  . APPENDECTOMY    . BACK SURGERY    . CARDIAC SURGERY    . CORONARY ARTERY BYPASS GRAFT    . KNEE SURGERY    . POSTERIOR CERVICAL LAMINECTOMY FOR EPIDURAL ABSCESS N/A 11/03/2013   Procedure: POSTERIOR CERVICAL AND THORACIC EVACUATION OF EPIDURAL ABSCESS, CERVICAL SIX AND THORACIC FOUR LAMINECTOMIES;  Surgeon: Winfield Cunas, MD;  Location: [facility];  Service: Neurosurgery;  Laterality: N/A;  . ROTATOR CUFF REPAIR    . TEE WITHOUT CARDIOVERSION N/A 11/12/2013   Procedure: TRANSESOPHAGEAL ECHOCARDIOGRAM (TEE);  Surgeon: Dorothy Spark, MD;  Location: [facility] ENDOSCOPY;  Service: Cardiovascular;  Laterality: N/A;    Current Outpatient Medications  Medication Sig Dispense Refill  . acetaminophen (TYLENOL) 500 MG tablet Take by mouth.    Marland Kitchen acyclovir (ZOVIRAX) 400 MG tablet Take 800 mg by mouth daily.     Marland Kitchen amLODipine (NORVASC) 5 MG tablet Take 5 mg by mouth daily.    Marland Kitchen aspirin 81 MG tablet Take 325 mg by mouth daily.     Marland Kitchen atorvastatin (LIPITOR) 40 MG tablet TAKE 1 TABLET BY MOUTH ONCE DAILY    . fenofibrate (TRICOR) 48 MG tablet Take by mouth.    Marland Kitchen  furosemide (LASIX) 20 MG tablet Take by mouth.    Marland Kitchen glipiZIDE (GLUCOTROL XL) 10 MG 24 hr tablet TAKE 1 TABLET BY MOUTH ONCE DAILY    . isosorbide mononitrate (IMDUR) 30 MG 24 hr tablet Take by mouth.    . metoprolol (LOPRESSOR) 50 MG tablet Take 50 mg by mouth 2 (two) times daily.    . nitroGLYCERIN (NITROSTAT) 0.4 MG SL tablet Place 0.4 mg under the tongue every 5 (five) minutes as needed for chest pain.    Marland Kitchen Potassium Citrate 15 MEQ (1620 MG) TBCR TAKE 1 TABLET BY MOUTH TWICE DAILY    . sertraline (ZOLOFT) 50 MG tablet Take 50 mg by mouth daily.    . sitaGLIPtin (JANUVIA) 50 MG tablet TAKE 1 TABLET BY MOUTH ONCE DAILY    . tamsulosin (FLOMAX) 0.4 MG CAPS capsule Take 1 capsule (0.4 mg total) by mouth daily. 7 capsule 0  . traZODone  (DESYREL) 50 MG tablet Take 100 mg by mouth at bedtime.     . meloxicam (MOBIC) 7.5 MG tablet Take 1 tablet (7.5 mg total) by mouth daily. (Patient not taking: Reported on 09/21/2018) 10 tablet 0   No current facility-administered medications for this visit.     Allergies:   Ace inhibitors and Oxycodone   Social History:  The patient  reports that he has quit smoking. He does not have any smokeless tobacco history on file. He reports current alcohol use. He reports that he does not use drugs.   Family History:  DM HTN CA  ROS:  Please see the history of present illness.   All other systems are personally reviewed and negative.    Exam:    Vital Signs:  BP 138/71 (BP Location: Left Arm, Patient Position: Sitting, Cuff Size: Normal)   Pulse 72   Ht 6\' 2"  (1.88 m)   Wt 199 lb (90.3 kg)   BMI 25.55 kg/m     Well appearing, alert and conversant, regular work of breathing,  good skin color Eyes- anicteric, neuro- grossly intact, skin- no apparent rash or lesions or cyanosis, mouth- oral mucosa is pink   Labs/Other Tests and Data Reviewed:    Recent Labs: No results found for requested labs within last 8760 hours.   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  Catheterization earlier this week demonstrated no interval progression of large vessel coronary disease.  Interestingly, he is able to  do recumbent bicycle exercise but not walking; this supports the hypothesis that it may be that his pacemaker is programmed with an exuberant rate response pushing demand above his ability to supply.  We do not yet have access to his device programming.  I have asked him to use a pulse oximeter to try to clarify heart rates that generated pain from those that do not, i.e. heart rates with vertical exercise versus those with recumbent exercise.   For now will continue him on  current meds  May need outpt GXT to try to reproduce symptoms but would like to avoid in [place] 19 screen The patient denies symptoms of COVID 19 at this time.  The importance of social distancing was discussed today.  Follow-up:  3 weeks  Next remote:  ASAP  Current medicines are reviewed at length with the patient today.      Labs/ tests ordered today include:   No orders of the defined types were placed in this encounter.   Future tests ( post COVID )     Patient Risk:  after full review of this patients clinical status, I feel that they are at moderate risk at this time.  Today, I have spent 33 minutes with the patient with telehealth technology discussing As above  .    Signed, Virl Axe, MD  09/21/2018 5:54 PM     [facility] [address] [place] [address] [phone] (office) [phone] (fax)

## 2018-09-22 ENCOUNTER — Other Ambulatory Visit: Payer: Self-pay

## 2018-09-22 ENCOUNTER — Ambulatory Visit (INDEPENDENT_AMBULATORY_CARE_PROVIDER_SITE_OTHER): Payer: Medicare Other | Admitting: *Deleted

## 2018-09-22 DIAGNOSIS — I495 Sick sinus syndrome: Secondary | ICD-10-CM | POA: Diagnosis not present

## 2018-09-22 DIAGNOSIS — R001 Bradycardia, unspecified: Secondary | ICD-10-CM

## 2018-09-22 LAB — CUP PACEART REMOTE DEVICE CHECK
Battery Remaining Longevity: 178 mo
Battery Voltage: 3.22 V
Brady Statistic AP VP Percent: 3.92 %
Brady Statistic AP VS Percent: 16.3 %
Brady Statistic AS VP Percent: 0.25 %
Brady Statistic AS VS Percent: 79.53 %
Brady Statistic RA Percent Paced: 20.29 %
Brady Statistic RV Percent Paced: 4.17 %
Date Time Interrogation Session: 20200417134542
Implantable Lead Implant Date: 20200203
Implantable Lead Implant Date: 20200203
Implantable Lead Location: 753859
Implantable Lead Location: 753860
Implantable Lead Model: 5076
Implantable Lead Model: 5076
Implantable Pulse Generator Implant Date: 20200203
Lead Channel Impedance Value: 380 Ohm
Lead Channel Impedance Value: 399 Ohm
Lead Channel Impedance Value: 494 Ohm
Lead Channel Impedance Value: 627 Ohm
Lead Channel Pacing Threshold Amplitude: 0.75 V
Lead Channel Pacing Threshold Amplitude: 0.875 V
Lead Channel Pacing Threshold Pulse Width: 0.4 ms
Lead Channel Pacing Threshold Pulse Width: 0.4 ms
Lead Channel Sensing Intrinsic Amplitude: 5.75 mV
Lead Channel Sensing Intrinsic Amplitude: 5.75 mV
Lead Channel Sensing Intrinsic Amplitude: 6.5 mV
Lead Channel Sensing Intrinsic Amplitude: 6.5 mV
Lead Channel Setting Pacing Amplitude: 2 V
Lead Channel Setting Pacing Amplitude: 2 V
Lead Channel Setting Pacing Pulse Width: 0.4 ms
Lead Channel Setting Sensing Sensitivity: 0.9 mV

## 2018-09-27 NOTE — Progress Notes (Signed)
Remote pacemaker transmission.   

## 2018-09-28 ENCOUNTER — Telehealth: Payer: Self-pay | Admitting: *Deleted

## 2018-09-28 NOTE — Telephone Encounter (Signed)
Made patient aware of DC appointment on 10/02/18 at 2:00pm (per Dr. Caryl Comes) for [facility] reprogramming. Gave office address. Pt verbalizes understanding and thanked me for my call.

## 2018-10-02 ENCOUNTER — Ambulatory Visit (INDEPENDENT_AMBULATORY_CARE_PROVIDER_SITE_OTHER): Payer: Medicare Other | Admitting: *Deleted

## 2018-10-02 ENCOUNTER — Other Ambulatory Visit: Payer: Self-pay

## 2018-10-02 DIAGNOSIS — I495 Sick sinus syndrome: Secondary | ICD-10-CM

## 2018-10-02 LAB — CUP PACEART INCLINIC DEVICE CHECK
Battery Remaining Longevity: 178 mo
Battery Voltage: 3.21 V
Brady Statistic AP VP Percent: 4.35 %
Brady Statistic AP VS Percent: 15.71 %
Brady Statistic AS VP Percent: 0.26 %
Brady Statistic AS VS Percent: 79.68 %
Brady Statistic RA Percent Paced: 20.11 %
Brady Statistic RV Percent Paced: 4.61 %
Date Time Interrogation Session: 20200427153137
Implantable Lead Implant Date: 20200203
Implantable Lead Implant Date: 20200203
Implantable Lead Location: 753859
Implantable Lead Location: 753860
Implantable Lead Model: 5076
Implantable Lead Model: 5076
Implantable Pulse Generator Implant Date: 20200203
Lead Channel Impedance Value: 380 Ohm
Lead Channel Impedance Value: 418 Ohm
Lead Channel Impedance Value: 513 Ohm
Lead Channel Impedance Value: 684 Ohm
Lead Channel Pacing Threshold Amplitude: 0.75 V
Lead Channel Pacing Threshold Amplitude: 1 V
Lead Channel Pacing Threshold Pulse Width: 0.4 ms
Lead Channel Pacing Threshold Pulse Width: 0.4 ms
Lead Channel Sensing Intrinsic Amplitude: 7 mV
Lead Channel Sensing Intrinsic Amplitude: 7.4 mV
Lead Channel Setting Pacing Amplitude: 2 V
Lead Channel Setting Pacing Amplitude: 2 V
Lead Channel Setting Pacing Pulse Width: 0.4 ms
Lead Channel Setting Sensing Sensitivity: 0.9 mV

## 2018-10-02 NOTE — Progress Notes (Signed)
Pacemaker check in clinic. Normal device function. Thresholds, sensing, impedances consistent with previous measurements. Device programmed to maximize longevity. No mode switch or high ventricular rates noted. Device programmed at appropriate safety margins; RA output increased to 2.0 V . Histogram distribution inappropriate for patient activity level, suggests most AP at ADL rate. Per Dr Caryl Comes, sensor turned off. Device programmed to optimize intrinsic conduction. Estimated longevity 14.8 yrs. Virtual visit with Dr Caryl Comes as scheduled.. Patient education completed.

## 2018-10-06 ENCOUNTER — Telehealth: Payer: Self-pay | Admitting: *Deleted

## 2018-10-06 NOTE — Telephone Encounter (Signed)
Dr. Caryl Comes,   I was supposed to call him today to follow up on his HR's as below.  It looks like he came in the office on 4/27 and had his device reprogrammed- do I still need to call him? I just wasn't sure.

## 2018-10-06 NOTE — Telephone Encounter (Signed)
-----   Message from Emily Filbert, RN sent at 09/21/2018 11:42 AM EDT ----- Call in 2 weeks (~ 4/30) for HR readings while walking & having CP & on bike (no CP)

## 2018-10-06 NOTE — Telephone Encounter (Signed)
I dont think necessary  We prorgrammed him early this week and his son in law said he felt better   If u have a moment to check though would be great   thx  Have a good weekend    I

## 2018-10-09 NOTE — Telephone Encounter (Signed)
I spoke with the patient. He states he is doing well since his device was re-programmed last week. He denies any chest pain.  He states he hasn't played any golf yet, but will soon and he will see how he tolerates this.   I advised I was glad he was feeling well this week and asked that he keep his follow up e-visit with Dr. Caryl Comes on 5/7 as scheduled.  The patient voices understanding and is agreeable.

## 2018-10-12 ENCOUNTER — Other Ambulatory Visit: Payer: Self-pay

## 2018-10-12 ENCOUNTER — Telehealth (INDEPENDENT_AMBULATORY_CARE_PROVIDER_SITE_OTHER): Payer: Medicare Other | Admitting: Internal Medicine

## 2018-10-12 VITALS — BP 148/80 | HR 69 | Ht 74.0 in | Wt 200.0 lb

## 2018-10-12 DIAGNOSIS — I25118 Atherosclerotic heart disease of native coronary artery with other forms of angina pectoris: Secondary | ICD-10-CM

## 2018-10-12 DIAGNOSIS — I459 Conduction disorder, unspecified: Secondary | ICD-10-CM

## 2018-10-12 NOTE — Progress Notes (Signed)
Electrophysiology TeleHealth Note   Due to national recommendations of social distancing due to COVID 19, an audio/video telehealth visit is felt to be most appropriate for this patient at this time.  See MyChart message from today for the patient's consent to telehealth for [facility].   Date:  10/12/2018   ID:  Michael Morton, DOB 04/26/1941, MRN [id number]  Location: patient's home  Provider location: [address], [place]  Evaluation Performed: Follow-up visit  PCP:  Maylon Peppers, MD  Cardiologist:   new Electrophysiologist:  SK   Chief Complaint:  Chest pain  History of Present Illness:    Michael Morton is a 78 y.o. male who presents via audio/video conferencing for a telehealth visit today.  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    Past Medical History:  Diagnosis Date  . Coronary artery disease   . Diabetes mellitus without complication   . Hypercholesteremia   . Hypertension   . Pneumonia     Past Surgical History:  Procedure Laterality Date  . APPENDECTOMY    . BACK SURGERY    . CARDIAC SURGERY    . CORONARY ARTERY BYPASS GRAFT    . KNEE SURGERY    . POSTERIOR CERVICAL LAMINECTOMY FOR EPIDURAL ABSCESS N/A 11/03/2013   Procedure: POSTERIOR CERVICAL AND THORACIC EVACUATION OF EPIDURAL ABSCESS, CERVICAL  SIX AND THORACIC FOUR LAMINECTOMIES;  Surgeon: Winfield Cunas, MD;  Location: [facility]UR[facility]S;  Service: Neurosurgery;  Laterality: N/A;  . ROTATOR CUFF REPAIR    . TEE WITHOUT CARDIOVERSION N/A 11/12/2013   Procedure: TRANSESOPHAGEAL ECHOCARDIOGRAM (TEE);  Surgeon: Dorothy Spark, MD;  Location: [facility] ENDOSCOPY;  Service: Cardiovascular;  Laterality: N/A;    Current Outpatient Medications  Medication Sig Dispense Refill  . acetaminophen (TYLENOL) 500 MG tablet Take 500 mg by mouth 2 (two) times daily.     Marland Kitchen acyclovir (ZOVIRAX) 400 MG tablet Take 400 mg by mouth daily.     Marland Kitchen amLODipine (NORVASC) 5 MG tablet Take 10 mg by mouth daily.     Marland Kitchen aspirin 81 MG tablet Take 81 mg by mouth daily.     Marland Kitchen atorvastatin (LIPITOR) 40 MG tablet TAKE 1 TABLET BY MOUTH ONCE DAILY    . fenofibrate (TRICOR) 48 MG tablet Take 48 mg by mouth daily.     . furosemide (LASIX) 20 MG tablet Take 20 mg by mouth daily.     Marland Kitchen glipiZIDE (GLUCOTROL XL) 10 MG 24 hr tablet TAKE 1 TABLET BY MOUTH ONCE DAILY    . losartan (COZAAR) 25 MG tablet Take 1 tablet by mouth daily.    . metoprolol succinate (TOPROL-XL) 25 MG 24 hr tablet Take 1 tablet by mouth daily.    . nitroGLYCERIN (NITROSTAT) 0.4 MG  SL tablet Place 0.4 mg under the tongue every 5 (five) minutes as needed for chest pain.    Marland Kitchen omeprazole (PRILOSEC) 40 MG capsule Take 1 capsule by mouth daily.    . Potassium Citrate 15 MEQ (1620 MG) TBCR TAKE 1 TABLET BY MOUTH TWICE DAILY    . sertraline (ZOLOFT) 50 MG tablet Take 50 mg by mouth daily.    . sitaGLIPtin (JANUVIA) 50 MG tablet TAKE 1 TABLET BY MOUTH ONCE DAILY    . tolterodine (DETROL LA) 4 MG 24 hr capsule Take 1 capsule by mouth daily.    . traZODone (DESYREL) 50 MG tablet Take 100 mg by mouth at bedtime.      No current facility-administered medications for this visit.     Allergies:   Ace inhibitors and Oxycodone   Social History:  The patient  reports that he has quit smoking. He does not have any smokeless  tobacco history on file. He reports current alcohol use. He reports that he does not use drugs.   Family History:  The patient's   family history is not on file.   ROS:  Please see the history of present illness.   All other systems are personally reviewed and negative.    Exam:    Vital Signs:  BP (!) 148/80   Pulse 69   Ht 6\' 2"  (1.88 m)   Wt 200 lb (90.7 kg)   BMI 25.68 kg/m     Well appearing, alert and conversant, regular work of breathing,  good skin color Eyes- anicteric, neuro- grossly intact, skin- no apparent rash or lesions or cyanosis, mouth- oral mucosa is pink   Labs/Other Tests and Data Reviewed:    Recent Labs: No results found for requested labs within last 8760 hours.   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   Heart rate excursion seems adequate.  Unlikely to explain his fatigue.  I have asked his wife to review sequential after visit summaries to see if we can identify new medications which might be contributinng  He would like to establish with [facility]--I will reach out to Dr. London Sheer to see about seeing him in the [facility] office  Continue current medications  Asymmetric edema seems like it is more likely related to his right leg anatomy; no reason at this juncture to think about DVT   COVID 19 screen The patient denies symptoms of COVID 19 at  this time.  The importance of social distancing was discussed today.  Follow-up:  61m  Would like to establish with Dr. London Sheer in the [facility] office   Next remote: As Scheduled  Current medicines are reviewed at length with the patient today.   The patient does not have concerns regarding his medicines.  The following changes were made today:  none  Labs/ tests ordered today include:   No orders  of the defined types were placed in this encounter.   Future tests ( post COVID )  v  Patient Risk:  after full review of this patients clinical status, I feel that they are at moderate risk at this time.  Today, I have spent  18  minutes with the patient with telehealth technology discussing the above.  Signed, Virl Axe, MD  10/12/2018 9:27 AM     [place] [address] [phone] (office) [phone] (fax)

## 2018-10-12 NOTE — Patient Instructions (Signed)
Medication Instructions:  - Your physician recommends that you continue on your current medications as directed. Please refer to the Current Medication list given to you today.  If you need a refill on your cardiac medications before your next appointment, please call your pharmacy.   Lab work: - none ordered  If you have labs (blood work) drawn today and your tests are completely normal, you will receive your results only by: Marland Kitchen MyChart Message (if you have MyChart) OR . A paper copy in the mail If you have any lab test that is abnormal or we need to change your treatment, we will call you to review the results.  Testing/Procedures: - none ordered  Follow-Up: At [facility], you and your health needs are our priority.  As part of our continuing mission to provide you with exceptional heart care, we have created designated Provider Care Teams.  These Care Teams include your primary Cardiologist (physician) and Advanced Practice Providers (APPs -  Physician Assistants and Nurse Practitioners) who all work together to provide you with the care you need, when you need it.  . You will need a follow up appointment in 3 months with Dr. Caryl Comes [facility] or [place]- whichever you prefer) . Please call our office 2 months in advance to schedule this appointment.  (please call in late May/ early June to schedule)  Any Other Special Instructions Will Be Listed Below (If Applicable). - N/A

## 2018-10-12 NOTE — Progress Notes (Signed)
Yes.  I will have Neoma Laming arrange office visits for both with me in the next several months when [facility] office reopens [place]

## 2018-10-12 NOTE — Progress Notes (Signed)
thxn  That was fast :))))   BTW  Their daughter is also a DUKIE

## 2018-10-23 ENCOUNTER — Telehealth: Payer: Self-pay | Admitting: Internal Medicine

## 2018-10-23 DIAGNOSIS — R001 Bradycardia, unspecified: Secondary | ICD-10-CM

## 2018-10-23 NOTE — Telephone Encounter (Signed)
Patient wife calling States they are still having trouble with patient's pacemaker Please call to discuss

## 2018-10-23 NOTE — Telephone Encounter (Signed)
Patient called in to report that he doesn't feel like his exercise tolerance is improved with recent pacemaker reprogramming, notes heart rate "drops" to ~60bpm after exercising for 10-20 minutes. Reports he feels tired almost all the time, but feels like he has to stop exercising after ~30min due to sudden fatigue.  Pt keeps records of HRs during exercise (per pulse oximeter). Trends with exercise (stationary bike) yesterday: Start of exercise: HR 73, O2 95% After 40min: HR 86, O2 95% After 17 min: HR 60, O2 94%  Obtained manual Carelink transmission. Normal device function. Presenting rhythm shows As, Ap/Vs @ 75bpm, P-R 397ms. No episodes. Lead trends stable. Histograms appear appropriate. Advised pt I will send this information to Dr. Caryl Comes for review and recommendations. Pt is agreeable to plan.  Pt reports some chest tightness at left chest ("near pacemaker"), initially reports that it occurs all the time, but then states that he's not having it now. Reports it is not worse than when he saw Dr. Caryl Comes. He is unsure if anything makes it better or worse, unable to reproduce sensation by palpating site or moving arm. Hasn't tried PRN nitro yet, but willing to try it next time. ED precautions given for worsening cardiac symptoms. Pt verbalizes understanding.

## 2018-10-23 NOTE — Telephone Encounter (Signed)
Called and spoke to the patient,  Likely represents 1) upper rate behaviour with conduction unable to maintain at faster HR 2) exercise induced bigeminy ( EM idea)  Will use a ZIO to ascertain mechanism  The chest pain is likely rate related and once we figure out the first will work on attenuating exercise induced HR ( ie BB)

## 2018-10-24 ENCOUNTER — Telehealth: Payer: Self-pay | Admitting: Radiology

## 2018-10-24 NOTE — Telephone Encounter (Signed)
Enrolled patient for a 14 day Zio Telemetry monitor to be mailed. Patient knows to expect monitor to arrive in 3-4 days.

## 2018-10-24 NOTE — Telephone Encounter (Signed)
Spoke with pt's wife and explained the reasoning behind the monitor. She understands he will receive in the mail in 4-6 business days and will be contacted to help with application. She has no additional questions.

## 2018-10-27 ENCOUNTER — Ambulatory Visit (INDEPENDENT_AMBULATORY_CARE_PROVIDER_SITE_OTHER): Payer: Medicare Other

## 2018-10-27 DIAGNOSIS — R001 Bradycardia, unspecified: Secondary | ICD-10-CM | POA: Diagnosis not present

## 2018-11-02 ENCOUNTER — Telehealth: Payer: Self-pay | Admitting: Internal Medicine

## 2018-11-02 NOTE — Telephone Encounter (Signed)
New Message:    Pt is wearing a monitor. He does not know how lon he is supposed to be wearing it.

## 2018-11-02 NOTE — Telephone Encounter (Signed)
Advised wife this is 14 day monitor. She thanks me for calling back

## 2018-11-17 ENCOUNTER — Other Ambulatory Visit: Payer: Self-pay

## 2018-12-11 NOTE — Progress Notes (Signed)
HPI: Follow-up coronary artery disease.  Previously followed in [facility].  Last echocardiogram February 2020 showed ejection fraction 65 to 70% and mild left ventricular hypertrophy.  Most recent cardiac catheterization April 2020 showed occluded LAD and right coronary artery moderate stenosis of the circumflex.  Patent stents in the first and second marginal.  Patent LIMA to the LAD and saphenous vein graft to PDA.  Saphenous vein graft to the first and second marginal occluded; ejection fraction 60%.  Medical therapy recommended.  Patient has had previous pacemaker for sick sinus syndrome (2/20).  Patient was having chest pain activities.  Dr. Caryl Comes and activated rate response for his pacemaker and his symptoms improved.  Today he complains of fatigue.  Minimal dyspnea on exertion but no orthopnea, PND, pedal edema, exertional chest pain or syncope.  Current Outpatient Medications  Medication Sig Dispense Refill  . acetaminophen (TYLENOL) 500 MG tablet Take 500 mg by mouth 2 (two) times daily.     Marland Kitchen acyclovir (ZOVIRAX) 400 MG tablet Take 400 mg by mouth daily.     Marland Kitchen amLODipine (NORVASC) 5 MG tablet Take 10 mg by mouth daily.     Marland Kitchen aspirin 81 MG tablet Take 81 mg by mouth daily.     Marland Kitchen atorvastatin (LIPITOR) 40 MG tablet TAKE 1 TABLET BY MOUTH ONCE DAILY    . fenofibrate (TRICOR) 48 MG tablet Take 48 mg by mouth daily.     . furosemide (LASIX) 20 MG tablet Take 20 mg by mouth daily.     Marland Kitchen glipiZIDE (GLUCOTROL XL) 10 MG 24 hr tablet TAKE 1 TABLET BY MOUTH ONCE DAILY    . isosorbide dinitrate (ISORDIL) 30 MG tablet Take 30 mg by mouth daily.    Marland Kitchen losartan (COZAAR) 25 MG tablet Take 1 tablet by mouth daily.    . metoprolol succinate (TOPROL-XL) 25 MG 24 hr tablet Take 1 tablet by mouth daily.    . nitroGLYCERIN (NITROSTAT) 0.4 MG SL tablet Place 0.4 mg under the tongue every 5 (five) minutes as needed for chest pain.    Marland Kitchen omeprazole (PRILOSEC) 40 MG capsule Take 1 capsule by mouth daily.     . Potassium Citrate 15 MEQ (1620 MG) TBCR TAKE 1 TABLET BY MOUTH TWICE DAILY    . sertraline (ZOLOFT) 50 MG tablet Take 50 mg by mouth daily.    . sitaGLIPtin (JANUVIA) 50 MG tablet TAKE 1 TABLET BY MOUTH ONCE DAILY    . tolterodine (DETROL LA) 4 MG 24 hr capsule Take 1 capsule by mouth daily.    . traZODone (DESYREL) 50 MG tablet Take 100 mg by mouth at bedtime.      No current facility-administered medications for this visit.      Past Medical History:  Diagnosis Date  . Coronary artery disease   . Diabetes mellitus without complication ([place])   . Hypercholesteremia   . Hypertension   . Pneumonia     Past Surgical History:  Procedure Laterality Date  . APPENDECTOMY    . BACK SURGERY    . CARDIAC SURGERY    . CORONARY ARTERY BYPASS GRAFT    . KNEE SURGERY    . POSTERIOR CERVICAL LAMINECTOMY FOR EPIDURAL ABSCESS N/A 11/03/2013   Procedure: POSTERIOR CERVICAL AND THORACIC EVACUATION OF EPIDURAL ABSCESS, CERVICAL SIX AND THORACIC FOUR LAMINECTOMIES;  Surgeon: Winfield Cunas, MD;  Location: [place] NEURO ORS;  Service: Neurosurgery;  Laterality: N/A;  . ROTATOR CUFF REPAIR    . TEE  WITHOUT CARDIOVERSION N/A 11/12/2013   Procedure: TRANSESOPHAGEAL ECHOCARDIOGRAM (TEE);  Surgeon: Dorothy Spark, MD;  Location: [facility] ENDOSCOPY;  Service: Cardiovascular;  Laterality: N/A;    Social History   Socioeconomic History  . Marital status: Married    Spouse name: Not on file  . Number of children: Not on file  . Years of education: Not on file  . Highest education level: Not on file  Occupational History  . Not on file  Social Needs  . Financial resource strain: Not on file  . Food insecurity    Worry: Not on file    Inability: Not on file  . Transportation needs    Medical: Not on file    Non-medical: Not on file  Tobacco Use  . Smoking status: Former Research scientist (life sciences)  . Smokeless tobacco: Never Used  Substance and Sexual Activity  . Alcohol use: Yes  . Drug use: No  . Sexual activity: Not  on file  Lifestyle  . Physical activity    Days per week: Not on file    Minutes per session: Not on file  . Stress: Not on file  Relationships  . Social Herbalist on phone: Not on file    Gets together: Not on file    Attends religious service: Not on file    Active member of club or organization: Not on file    Attends meetings of clubs or organizations: Not on file    Relationship status: Not on file  . Intimate partner violence    Fear of current or ex partner: Not on file    Emotionally abused: Not on file    Physically abused: Not on file    Forced sexual activity: Not on file  Other Topics Concern  . Not on file  Social History Narrative  . Not on file    History reviewed. No pertinent family history.  ROS: no fevers or chills, productive cough, hemoptysis, dysphasia, odynophagia, melena, hematochezia, dysuria, hematuria, rash, seizure activity, orthopnea, PND, pedal edema, claudication. Remaining systems are negative.  Physical Exam: Well-developed well-nourished in no acute distress.  Skin is warm and dry.  HEENT is normal.  Neck is supple.  Chest is clear to auscultation with normal expansion.  Cardiovascular exam is regular rate and rhythm.  Abdominal exam nontender or distended. No masses palpated. Extremities show no edema. neuro grossly intact  ECG-sinus rhythm at a rate of 75, first-degree AV block, right bundle branch block, prior inferior infarct.  Personally reviewed  A/P  1 coronary artery disease status post coronary artery bypass graft-recent catheterization as outlined in HPI. Plan medical therapy.  Continue aspirin and statin.  2 previous pacemaker-managed by electrophysiology.  3 history of stage III 4 renal insufficiency-followed by nephrology.  4 hyperlipidemia-continue statin.  We will have most recent lipids forwarded to [place] from primary care.  5 hypertension-patient's blood pressure is controlled.  Continue present medications  and follow.  6 fatigue-check TSH.  Check hemoglobin.  I am not convinced Toprol is contributing but will hold for 3 weeks to see if symptoms improve.  Follow blood pressure and adjust regimen if needed.  Kirk Ruths, MD

## 2018-12-13 ENCOUNTER — Ambulatory Visit (INDEPENDENT_AMBULATORY_CARE_PROVIDER_SITE_OTHER): Payer: Medicare Other | Admitting: Cardiology

## 2018-12-13 ENCOUNTER — Other Ambulatory Visit: Payer: Self-pay

## 2018-12-13 ENCOUNTER — Encounter: Payer: Self-pay | Admitting: Cardiology

## 2018-12-13 VITALS — BP 140/76 | HR 75 | Ht 74.0 in | Wt 201.8 lb

## 2018-12-13 DIAGNOSIS — R5383 Other fatigue: Secondary | ICD-10-CM | POA: Diagnosis not present

## 2018-12-13 DIAGNOSIS — Z950 Presence of cardiac pacemaker: Secondary | ICD-10-CM | POA: Diagnosis not present

## 2018-12-13 DIAGNOSIS — I10 Essential (primary) hypertension: Secondary | ICD-10-CM

## 2018-12-13 DIAGNOSIS — I25118 Atherosclerotic heart disease of native coronary artery with other forms of angina pectoris: Secondary | ICD-10-CM

## 2018-12-13 NOTE — Patient Instructions (Addendum)
Medication Instructions:  STOP METOPROLOL If you need a refill on your cardiac medications before your next appointment, please call your pharmacy.   Lab work: Your physician recommends that you HAVE LAB WORK TODAY If you have labs (blood work) drawn today and your tests are completely normal, you will receive your results only by: Marland Kitchen MyChart Message (if you have MyChart) OR . A paper copy in the mail If you have any lab test that is abnormal or we need to change your treatment, we will call you to review the results.  Follow-Up: At [facility], you and your health needs are our priority.  As part of our continuing mission to provide you with exceptional heart care, we have created designated Provider Care Teams.  These Care Teams include your primary Cardiologist (physician) and Advanced Practice Providers (APPs -  Physician Assistants and Nurse Practitioners) who all work together to provide you with the care you need, when you need it. You will need a follow up appointment in 6 months.  Please call our office 2 months in advance to schedule this appointment.  You may see Kirk Ruths MD.

## 2018-12-14 LAB — CBC
Hematocrit: 46.1 % (ref 37.5–51.0)
Hemoglobin: 15.5 g/dL (ref 13.0–17.7)
MCH: 30.9 pg (ref 26.6–33.0)
MCHC: 33.6 g/dL (ref 31.5–35.7)
MCV: 92 fL (ref 79–97)
Platelets: 233 10*3/uL (ref 150–450)
RBC: 5.02 x10E6/uL (ref 4.14–5.80)
RDW: 13.8 % (ref 11.6–15.4)
WBC: 5.5 10*3/uL (ref 3.4–10.8)

## 2018-12-14 LAB — TSH: TSH: 1.1 u[IU]/mL (ref 0.450–4.500)

## 2018-12-22 ENCOUNTER — Ambulatory Visit (INDEPENDENT_AMBULATORY_CARE_PROVIDER_SITE_OTHER): Payer: Medicare Other | Admitting: *Deleted

## 2018-12-22 DIAGNOSIS — I495 Sick sinus syndrome: Secondary | ICD-10-CM | POA: Diagnosis not present

## 2018-12-22 LAB — CUP PACEART REMOTE DEVICE CHECK
Battery Remaining Longevity: 178 mo
Battery Voltage: 3.2 V
Brady Statistic AP VP Percent: 0.01 %
Brady Statistic AP VS Percent: 3.21 %
Brady Statistic AS VP Percent: 0.06 %
Brady Statistic AS VS Percent: 96.73 %
Brady Statistic RA Percent Paced: 3.19 %
Brady Statistic RV Percent Paced: 0.07 %
Date Time Interrogation Session: 20200717062755
Implantable Lead Implant Date: 20200203
Implantable Lead Implant Date: 20200203
Implantable Lead Location: 753859
Implantable Lead Location: 753860
Implantable Lead Model: 5076
Implantable Lead Model: 5076
Implantable Pulse Generator Implant Date: 20200203
Lead Channel Impedance Value: 380 Ohm
Lead Channel Impedance Value: 399 Ohm
Lead Channel Impedance Value: 513 Ohm
Lead Channel Impedance Value: 608 Ohm
Lead Channel Pacing Threshold Amplitude: 0.75 V
Lead Channel Pacing Threshold Amplitude: 0.875 V
Lead Channel Pacing Threshold Pulse Width: 0.4 ms
Lead Channel Pacing Threshold Pulse Width: 0.4 ms
Lead Channel Sensing Intrinsic Amplitude: 5 mV
Lead Channel Sensing Intrinsic Amplitude: 5 mV
Lead Channel Sensing Intrinsic Amplitude: 6.125 mV
Lead Channel Sensing Intrinsic Amplitude: 6.125 mV
Lead Channel Setting Pacing Amplitude: 2 V
Lead Channel Setting Pacing Amplitude: 2 V
Lead Channel Setting Pacing Pulse Width: 0.4 ms
Lead Channel Setting Sensing Sensitivity: 0.9 mV

## 2018-12-27 ENCOUNTER — Encounter: Payer: Self-pay | Admitting: Cardiology

## 2018-12-27 NOTE — Progress Notes (Signed)
Remote pacemaker transmission.   

## 2019-01-02 ENCOUNTER — Telehealth: Payer: Self-pay | Admitting: Internal Medicine

## 2019-01-02 NOTE — Telephone Encounter (Signed)

## 2019-01-03 ENCOUNTER — Other Ambulatory Visit: Payer: Self-pay

## 2019-01-03 ENCOUNTER — Encounter: Payer: Self-pay | Admitting: Internal Medicine

## 2019-01-03 ENCOUNTER — Ambulatory Visit (INDEPENDENT_AMBULATORY_CARE_PROVIDER_SITE_OTHER): Payer: Medicare Other | Admitting: Internal Medicine

## 2019-01-03 VITALS — BP 138/80 | HR 86 | Ht 74.0 in | Wt 204.4 lb

## 2019-01-03 DIAGNOSIS — Z950 Presence of cardiac pacemaker: Secondary | ICD-10-CM | POA: Diagnosis not present

## 2019-01-03 DIAGNOSIS — I495 Sick sinus syndrome: Secondary | ICD-10-CM

## 2019-01-03 DIAGNOSIS — R001 Bradycardia, unspecified: Secondary | ICD-10-CM | POA: Diagnosis not present

## 2019-01-03 DIAGNOSIS — I25118 Atherosclerotic heart disease of native coronary artery with other forms of angina pectoris: Secondary | ICD-10-CM

## 2019-01-03 LAB — CUP PACEART INCLINIC DEVICE CHECK
Battery Remaining Longevity: 177 mo
Battery Voltage: 3.19 V
Brady Statistic AP VP Percent: 0.01 %
Brady Statistic AP VS Percent: 3.17 %
Brady Statistic AS VP Percent: 0.08 %
Brady Statistic AS VS Percent: 96.74 %
Brady Statistic RA Percent Paced: 3.15 %
Brady Statistic RV Percent Paced: 0.09 %
Date Time Interrogation Session: 20200729145138
Implantable Lead Implant Date: 20200203
Implantable Lead Implant Date: 20200203
Implantable Lead Location: 753859
Implantable Lead Location: 753860
Implantable Lead Model: 5076
Implantable Lead Model: 5076
Implantable Pulse Generator Implant Date: 20200203
Lead Channel Impedance Value: 361 Ohm
Lead Channel Impedance Value: 380 Ohm
Lead Channel Impedance Value: 513 Ohm
Lead Channel Impedance Value: 608 Ohm
Lead Channel Pacing Threshold Amplitude: 0.75 V
Lead Channel Pacing Threshold Amplitude: 0.75 V
Lead Channel Pacing Threshold Pulse Width: 0.4 ms
Lead Channel Pacing Threshold Pulse Width: 0.4 ms
Lead Channel Sensing Intrinsic Amplitude: 5.875 mV
Lead Channel Sensing Intrinsic Amplitude: 7.75 mV
Lead Channel Setting Pacing Amplitude: 2 V
Lead Channel Setting Pacing Amplitude: 2 V
Lead Channel Setting Pacing Pulse Width: 0.4 ms
Lead Channel Setting Sensing Sensitivity: 0.9 mV

## 2019-01-03 MED ORDER — LOSARTAN POTASSIUM 25 MG PO TABS: 25.0000 mg | ORAL_TABLET | Freq: Every day | ORAL | 3 refills | Status: AC

## 2019-01-03 MED ORDER — ISOSORBIDE DINITRATE 30 MG PO TABS: 60.0000 mg | ORAL_TABLET | Freq: Every day | ORAL | 3 refills | Status: AC

## 2019-01-03 MED ORDER — LOSARTAN POTASSIUM 50 MG PO TABS: 50.0000 mg | ORAL_TABLET | Freq: Every day | ORAL | 3 refills | Status: DC

## 2019-01-03 NOTE — Patient Instructions (Addendum)
Medication Instructions:  Your physician has recommended you make the following change in your medication:   1. Increase your Isosorbide dinitrate to 60mg , two tablet, once daily  Labwork: None ordered.  Testing/Procedures: None ordered.  Follow-Up: Your physician recommends that you schedule a follow-up appointment in:   12 months with Dr. Caryl Comes  Any Other Special Instructions Will Be Listed Below (If Applicable).     If you need a refill on your cardiac medications before your next appointment, please call your pharmacy.

## 2019-01-03 NOTE — Progress Notes (Signed)
Patient Care Team: Maylon Peppers, MD as PCP - General (Family Medicine)   HPI  Michael Morton is a 78 y.o. male Seen in followup for pacemaker implanted at [facility] that was complicated by excessive rates and exercise associated chest discomfort.  This was addressed by treating office rate response with resolution of his chest discomfort.  He also has episodes where he feels like his heart rate decreases.  A ZIO patch was utilized and And shows some abrupt changes in heart rate but not clearly associated with exercise.  Dr. London Sheer had stopped his metoprolol.  His fatigue is better.  He is also having fewer heart rate changes; this suggests that indeed the symptoms may been related to the heart rate and their changes.  I am unable to interpret the data from ZIO.  I have forward the information to them to have them elaborate dated says for me.    DATE TEST EF   1/20  Echo  60-65%   1/20 MYOVIEW  74 % No Ischemia  4/20 LHC   LIMA-LAD; SVG PD patent SVG OM1 & OM2 occluded OM1 and OM2 stents patent        Date Cr K Hgb  4/20 1.85 4.5 15.9   6/20 2.09 4.8      He is Michael Willifords father  Records and Results Reviewed   Past Medical History:  Diagnosis Date  . Coronary artery disease   . Diabetes mellitus without complication ([place])   . Hypercholesteremia   . Hypertension   . Pneumonia     Past Surgical History:  Procedure Laterality Date  . APPENDECTOMY    . BACK SURGERY    . CARDIAC SURGERY    . CORONARY ARTERY BYPASS GRAFT    . KNEE SURGERY    . POSTERIOR CERVICAL LAMINECTOMY FOR EPIDURAL ABSCESS N/A 11/03/2013   Procedure: POSTERIOR CERVICAL AND THORACIC EVACUATION OF EPIDURAL ABSCESS, CERVICAL SIX AND THORACIC FOUR LAMINECTOMIES;  Surgeon: Winfield Cunas, MD;  Location: [place] NEURO ORS;  Service: Neurosurgery;  Laterality: N/A;  . ROTATOR CUFF REPAIR    . TEE WITHOUT CARDIOVERSION N/A 11/12/2013   Procedure: TRANSESOPHAGEAL ECHOCARDIOGRAM (TEE);  Surgeon:  Dorothy Spark, MD;  Location: [facility] ENDOSCOPY;  Service: Cardiovascular;  Laterality: N/A;    Current Meds  Medication Sig  . acetaminophen (TYLENOL) 500 MG tablet Take 500 mg by mouth 2 (two) times daily.   Marland Kitchen acyclovir (ZOVIRAX) 400 MG tablet Take 400 mg by mouth daily.   Marland Kitchen amLODipine (NORVASC) 5 MG tablet Take 10 mg by mouth daily.   Marland Kitchen aspirin 81 MG tablet Take 81 mg by mouth daily.   Marland Kitchen atorvastatin (LIPITOR) 40 MG tablet TAKE 1 TABLET BY MOUTH ONCE DAILY  . fenofibrate (TRICOR) 145 MG tablet Take 1 tablet by mouth daily.  . furosemide (LASIX) 20 MG tablet Take 20 mg by mouth daily.   Marland Kitchen glipiZIDE (GLUCOTROL XL) 10 MG 24 hr tablet TAKE 1 TABLET BY MOUTH ONCE DAILY  . isosorbide dinitrate (ISORDIL) 30 MG tablet Take 30 mg by mouth daily.  Marland Kitchen losartan (COZAAR) 25 MG tablet Take 1 tablet by mouth daily.  . nitroGLYCERIN (NITROSTAT) 0.4 MG SL tablet Place 0.4 mg under the tongue every 5 (five) minutes as needed for chest pain.  Marland Kitchen omeprazole (PRILOSEC) 40 MG capsule Take 1 capsule by mouth daily.  . Potassium Citrate 15 MEQ (1620 MG) TBCR TAKE 1 TABLET BY MOUTH TWICE DAILY  . sertraline (ZOLOFT)  50 MG tablet Take 50 mg by mouth daily.  . sitaGLIPtin (JANUVIA) 50 MG tablet TAKE 1 TABLET BY MOUTH ONCE DAILY  . tolterodine (DETROL LA) 4 MG 24 hr capsule Take 1 capsule by mouth daily.  . traZODone (DESYREL) 50 MG tablet Take 100 mg by mouth at bedtime.     Allergies  Allergen Reactions  . Ace Inhibitors Other (See Comments)    Unknown   . Oxycodone Other (See Comments)    Patient states it makes him feel disoriented      Review of Systems negative except from HPI and PMH  Physical Exam BP 138/80   Pulse 86   Ht 6\' 2"  (1.88 m)   Wt 204 lb 6.4 oz (92.7 kg)   SpO2 98%   BMI 26.24 kg/m     Well developed and well nourished in no acute distress HENT normal Neck supple with JVP-flat Clear Device pocket well healed; without hematoma or erythema.  There is no tethering  Regular  rate and rhythm, no  gallop No murmur Abd-soft with active BS No Clubbing cyanosis edema was at thank you  edema Skin-warm and dry A & Oriented  Grossly normal sensory and motor function  ECG sinus 86 24/16/42 RBBB     Assessment and  Plan  Pacemaker Medtronic  Coronary artery disease with prior CABG and interval stenting  Chest discomfort-exertional-vertical but not recumbent  Renal insufficiency grade 3-4  Diabetes    Device function normal  zio patch Personally reviewed  As above  BP elevated>> will continue losartan 25  Increase isordil 30>>60   Current medicines are reviewed at length with the patient today .  The patient does not  have concerns regarding medicines.

## 2019-01-03 NOTE — Progress Notes (Signed)
Called and spoke to the patient,  Likely represents 1) upper rate behaviour with conduction unable to maintain at faster HR 2) exercise induced bigeminy ( EM idea)  Will use a ZIO to ascertain mechanism  The chest pain is likely rate related and once we figure out the first will work on attenuating exercise induced HR ( ie BB)

## 2019-03-23 ENCOUNTER — Ambulatory Visit (INDEPENDENT_AMBULATORY_CARE_PROVIDER_SITE_OTHER): Payer: Medicare Other | Admitting: *Deleted

## 2019-03-23 DIAGNOSIS — I442 Atrioventricular block, complete: Secondary | ICD-10-CM

## 2019-03-26 LAB — CUP PACEART REMOTE DEVICE CHECK
Battery Remaining Longevity: 176 mo
Battery Voltage: 3.17 V
Brady Statistic AP VP Percent: 0.01 %
Brady Statistic AP VS Percent: 0.3 %
Brady Statistic AS VP Percent: 0.08 %
Brady Statistic AS VS Percent: 99.61 %
Brady Statistic RA Percent Paced: 0.32 %
Brady Statistic RV Percent Paced: 0.09 %
Date Time Interrogation Session: 20201019000032
Implantable Lead Implant Date: 20200203
Implantable Lead Implant Date: 20200203
Implantable Lead Location: 753859
Implantable Lead Location: 753860
Implantable Lead Model: 5076
Implantable Lead Model: 5076
Implantable Pulse Generator Implant Date: 20200203
Lead Channel Impedance Value: 361 Ohm
Lead Channel Impedance Value: 361 Ohm
Lead Channel Impedance Value: 532 Ohm
Lead Channel Impedance Value: 627 Ohm
Lead Channel Pacing Threshold Amplitude: 0.875 V
Lead Channel Pacing Threshold Amplitude: 1 V
Lead Channel Pacing Threshold Pulse Width: 0.4 ms
Lead Channel Pacing Threshold Pulse Width: 0.4 ms
Lead Channel Sensing Intrinsic Amplitude: 4 mV
Lead Channel Sensing Intrinsic Amplitude: 4 mV
Lead Channel Sensing Intrinsic Amplitude: 5.625 mV
Lead Channel Sensing Intrinsic Amplitude: 5.625 mV
Lead Channel Setting Pacing Amplitude: 2 V
Lead Channel Setting Pacing Amplitude: 2 V
Lead Channel Setting Pacing Pulse Width: 0.4 ms
Lead Channel Setting Sensing Sensitivity: 0.9 mV

## 2019-03-28 NOTE — Progress Notes (Signed)
Remote pacemaker transmission.   

## 2019-05-07 ENCOUNTER — Telehealth: Payer: Self-pay | Admitting: Internal Medicine

## 2019-05-07 NOTE — Telephone Encounter (Signed)
Patient's wife is calling about his isosorbide dinitrate (ISORDIL) 30 MG tablet.    Recent BP readings: 11/23 147/74  HR 65 11/24 133/69  HR 71 11/25 136/71  HR 70 11/26 139/71  HR 66 11/27  NO READING 11/28 145/71   HR 64 11/29   NO READING 11/30 144/75  HR 63  Patient's wife wants to know if he should take one or two tablets a day. She states he has been feeling very tired and has no energy, he is taking B12 injections, plus taking 1000 extra of B12 vitamin.

## 2019-05-07 NOTE — Telephone Encounter (Signed)
I spoke to the patient and his wife because his PCP has decreased his Isordil to 30 mg Daily, because his BP was looking good and thought to decrease his medication.    He will check his BP/HR for a week and give [place] feedback on how the change is coming.

## 2019-06-22 ENCOUNTER — Ambulatory Visit (INDEPENDENT_AMBULATORY_CARE_PROVIDER_SITE_OTHER): Payer: Medicare Other | Admitting: *Deleted

## 2019-06-22 DIAGNOSIS — Z950 Presence of cardiac pacemaker: Secondary | ICD-10-CM | POA: Diagnosis not present

## 2019-06-22 LAB — CUP PACEART REMOTE DEVICE CHECK
Battery Remaining Longevity: 173 mo
Battery Voltage: 3.14 V
Brady Statistic AP VP Percent: 0.01 %
Brady Statistic AP VS Percent: 3.32 %
Brady Statistic AS VP Percent: 0.11 %
Brady Statistic AS VS Percent: 96.56 %
Brady Statistic RA Percent Paced: 3.33 %
Brady Statistic RV Percent Paced: 0.13 %
Date Time Interrogation Session: 20210115004854
Implantable Lead Implant Date: 20200203
Implantable Lead Implant Date: 20200203
Implantable Lead Location: 753859
Implantable Lead Location: 753860
Implantable Lead Model: 5076
Implantable Lead Model: 5076
Implantable Pulse Generator Implant Date: 20200203
Lead Channel Impedance Value: 380 Ohm
Lead Channel Impedance Value: 380 Ohm
Lead Channel Impedance Value: 532 Ohm
Lead Channel Impedance Value: 589 Ohm
Lead Channel Pacing Threshold Amplitude: 0.75 V
Lead Channel Pacing Threshold Amplitude: 1.125 V
Lead Channel Pacing Threshold Pulse Width: 0.4 ms
Lead Channel Pacing Threshold Pulse Width: 0.4 ms
Lead Channel Sensing Intrinsic Amplitude: 4.75 mV
Lead Channel Sensing Intrinsic Amplitude: 4.75 mV
Lead Channel Sensing Intrinsic Amplitude: 7.375 mV
Lead Channel Sensing Intrinsic Amplitude: 7.375 mV
Lead Channel Setting Pacing Amplitude: 2 V
Lead Channel Setting Pacing Amplitude: 2.25 V
Lead Channel Setting Pacing Pulse Width: 0.4 ms
Lead Channel Setting Sensing Sensitivity: 0.9 mV

## 2019-06-22 NOTE — Progress Notes (Signed)
PPM remote 

## 2019-07-03 ENCOUNTER — Other Ambulatory Visit: Payer: Self-pay | Admitting: *Deleted

## 2019-08-20 NOTE — Progress Notes (Signed)
HPI: Follow-up coronary artery disease.  Previously followed in [facility].  Last echocardiogram February 2020 showed ejection fraction 65 to 70% and mild left ventricular hypertrophy.  Most recent cardiac catheterization April 2020 showed occluded LAD and right coronary artery moderate stenosis of the circumflex. Patent stents in the first and second marginal. Patent LIMA to the LAD and saphenous vein graft to PDA. Saphenous vein graft to the first and second marginal occluded; ejection fraction 60%. Medical therapy recommended. Patient has had previous pacemaker for sick sinus syndrome (2/20).  Beta-blocker discontinued at last office visit due to fatigue. Since last seen, the patient has dyspnea with more extreme activities but not with routine activities. It is relieved with rest. It is not associated with chest pain. There is no orthopnea, PND or pedal edema. There is no syncope or palpitations. There is no exertional chest pain.   Current Outpatient Medications  Medication Sig Dispense Refill  . acetaminophen (TYLENOL) 500 MG tablet Take 500 mg by mouth 2 (two) times daily.     Marland Kitchen acyclovir (ZOVIRAX) 400 MG tablet Take 400 mg by mouth daily.     Marland Kitchen amLODipine (NORVASC) 5 MG tablet Take 10 mg by mouth daily.     Marland Kitchen aspirin 81 MG tablet Take 81 mg by mouth daily.     Marland Kitchen atorvastatin (LIPITOR) 40 MG tablet TAKE 1 TABLET BY MOUTH ONCE DAILY    . fenofibrate (TRICOR) 145 MG tablet Take 1 tablet by mouth daily.    . furosemide (LASIX) 20 MG tablet Take 20 mg by mouth daily.     Marland Kitchen glipiZIDE (GLUCOTROL XL) 10 MG 24 hr tablet TAKE 1 TABLET BY MOUTH ONCE DAILY    . isosorbide dinitrate (ISORDIL) 30 MG tablet Take 2 tablets (60 mg total) by mouth daily. 180 tablet 3  . losartan (COZAAR) 25 MG tablet Take 1 tablet (25 mg total) by mouth daily. 90 tablet 3  . nitroGLYCERIN (NITROSTAT) 0.4 MG SL tablet Place 0.4 mg under the tongue every 5 (five) minutes as needed for chest pain.    Marland Kitchen omeprazole  (PRILOSEC) 40 MG capsule Take 1 capsule by mouth daily.    . Potassium Citrate 15 MEQ (1620 MG) TBCR TAKE 1 TABLET BY MOUTH TWICE DAILY    . sertraline (ZOLOFT) 50 MG tablet Take 50 mg by mouth daily.    . sitaGLIPtin (JANUVIA) 50 MG tablet TAKE 1 TABLET BY MOUTH ONCE DAILY    . tolterodine (DETROL LA) 4 MG 24 hr capsule Take 1 capsule by mouth daily.    . traZODone (DESYREL) 50 MG tablet Take 100 mg by mouth at bedtime.      No current facility-administered medications for this visit.     Past Medical History:  Diagnosis Date  . Coronary artery disease   . Diabetes mellitus without complication ([place])   . Hypercholesteremia   . Hypertension   . Pneumonia     Past Surgical History:  Procedure Laterality Date  . APPENDECTOMY    . BACK SURGERY    . CARDIAC SURGERY    . CORONARY ARTERY BYPASS GRAFT    . KNEE SURGERY    . POSTERIOR CERVICAL LAMINECTOMY FOR EPIDURAL ABSCESS N/A 11/03/2013   Procedure: POSTERIOR CERVICAL AND THORACIC EVACUATION OF EPIDURAL ABSCESS, CERVICAL SIX AND THORACIC FOUR LAMINECTOMIES;  Surgeon: Winfield Cunas, MD;  Location: [place] NEURO ORS;  Service: Neurosurgery;  Laterality: N/A;  . ROTATOR CUFF REPAIR    . TEE WITHOUT  CARDIOVERSION N/A 11/12/2013   Procedure: TRANSESOPHAGEAL ECHOCARDIOGRAM (TEE);  Surgeon: Dorothy Spark, MD;  Location: [facility] ENDOSCOPY;  Service: Cardiovascular;  Laterality: N/A;    Social History   Socioeconomic History  . Marital status: Married    Spouse name: Not on file  . Number of children: Not on file  . Years of education: Not on file  . Highest education level: Not on file  Occupational History  . Not on file  Tobacco Use  . Smoking status: Former Research scientist (life sciences)  . Smokeless tobacco: Never Used  Substance and Sexual Activity  . Alcohol use: Yes  . Drug use: No  . Sexual activity: Not on file  Other Topics Concern  . Not on file  Social History Narrative  . Not on file   Social Determinants of Health   Financial Resource  Strain:   . Difficulty of Paying Living Expenses:   Food Insecurity:   . Worried About Charity fundraiser in the Last Year:   . Arboriculturist in the Last Year:   Transportation Needs:   . Film/video editor (Medical):   Marland Kitchen Lack of Transportation (Non-Medical):   Physical Activity:   . Days of Exercise per Week:   . Minutes of Exercise per Session:   Stress:   . Feeling of Stress :   Social Connections:   . Frequency of Communication with Friends and Family:   . Frequency of Social Gatherings with Friends and Family:   . Attends Religious Services:   . Active Member of Clubs or Organizations:   . Attends Archivist Meetings:   Marland Kitchen Marital Status:   Intimate Partner Violence:   . Fear of Current or Ex-Partner:   . Emotionally Abused:   Marland Kitchen Physically Abused:   . Sexually Abused:     History reviewed. No pertinent family history.  ROS: no fevers or chills, productive cough, hemoptysis, dysphasia, odynophagia, melena, hematochezia, dysuria, hematuria, rash, seizure activity, orthopnea, PND, pedal edema, claudication. Remaining systems are negative.  Physical Exam: Well-developed well-nourished in no acute distress.  Skin is warm and dry.  HEENT is normal.  Neck is supple.  Chest is clear to auscultation with normal expansion.  Cardiovascular exam is regular rate and rhythm.  Abdominal exam nontender or distended. No masses palpated. + bruit Extremities show no edema. neuro grossly intact   A/P  1 coronary artery disease-patient denies recurrent chest pain.  Continue medical therapy with aspirin and statin.  2 hypertension-blood pressure controlled.  Continue present medical regimen.  3 hyperlipidemia-continue statin.  Will obtain most recent lipids and liver from primary care.  4 prior pacemaker-Per electrophysiology.   5 renal insufficiency-followed by nephrology.  6 bruit-schedule abdominal ultrasound to exclude aneurysm.  Kirk Ruths, MD

## 2019-08-29 ENCOUNTER — Encounter: Payer: Self-pay | Admitting: Cardiology

## 2019-08-29 ENCOUNTER — Other Ambulatory Visit: Payer: Self-pay

## 2019-08-29 ENCOUNTER — Ambulatory Visit: Payer: Medicare Other | Admitting: Cardiology

## 2019-08-29 VITALS — BP 118/62 | HR 70 | Ht 74.0 in | Wt 200.8 lb

## 2019-08-29 DIAGNOSIS — I25118 Atherosclerotic heart disease of native coronary artery with other forms of angina pectoris: Secondary | ICD-10-CM

## 2019-08-29 DIAGNOSIS — E782 Mixed hyperlipidemia: Secondary | ICD-10-CM

## 2019-08-29 DIAGNOSIS — R0989 Other specified symptoms and signs involving the circulatory and respiratory systems: Secondary | ICD-10-CM

## 2019-08-29 DIAGNOSIS — I10 Essential (primary) hypertension: Secondary | ICD-10-CM

## 2019-08-29 NOTE — Patient Instructions (Signed)
Medication Instructions:  NO CHANGE *If you need a refill on your cardiac medications before your next appointment, please call your pharmacy*   Lab Work: If you have labs (blood work) drawn today and your tests are completely normal, you will receive your results only by: Marland Kitchen MyChart Message (if you have MyChart) OR . A paper copy in the mail If you have any lab test that is abnormal or we need to change your treatment, we will call you to review the results.   Testing/Procedures: Your physician has requested that you have an abdominal aorta duplex. During this test, an ultrasound is used to evaluate the aorta. Allow 30 minutes for this exam. Do not eat after midnight the day before and avoid carbonated beverages [place] [facility]   Follow-Up: At [facility], you and your health needs are our priority.  As part of our continuing mission to provide you with exceptional heart care, we have created designated Provider Care Teams.  These Care Teams include your primary Cardiologist (physician) and Advanced Practice Providers (APPs -  Physician Assistants and Nurse Practitioners) who all work together to provide you with the care you need, when you need it.  We recommend signing up for the patient portal called "MyChart".  Sign up information is provided on this After Visit Summary.  MyChart is used to connect with patients for Virtual Visits (Telemedicine).  Patients are able to view lab/test results, encounter notes, upcoming appointments, etc.  Non-urgent messages can be sent to your provider as well.   To learn more about what you can do with MyChart, go to [email or web address].    Your next appointment:   12 month(s)  The format for your next appointment:   Either In Person or Virtual  Provider:   Kirk Ruths, MD

## 2019-09-10 ENCOUNTER — Ambulatory Visit (HOSPITAL_BASED_OUTPATIENT_CLINIC_OR_DEPARTMENT_OTHER)
Admission: RE | Admit: 2019-09-10 | Discharge: 2019-09-10 | Disposition: A | Discharge status: Home / Self Care | Payer: Medicare Other | Source: Ambulatory Visit | Attending: Cardiology | Admitting: Cardiology

## 2019-09-10 ENCOUNTER — Other Ambulatory Visit: Payer: Self-pay

## 2019-09-10 DIAGNOSIS — R0989 Other specified symptoms and signs involving the circulatory and respiratory systems: Secondary | ICD-10-CM | POA: Diagnosis not present

## 2019-09-10 NOTE — Progress Notes (Signed)
Abdominal Aorta Duplex   09/10/19 Cardell Peach RDCS, RVT

## 2019-09-21 ENCOUNTER — Ambulatory Visit (INDEPENDENT_AMBULATORY_CARE_PROVIDER_SITE_OTHER): Payer: Medicare Other | Admitting: *Deleted

## 2019-09-21 DIAGNOSIS — Z950 Presence of cardiac pacemaker: Secondary | ICD-10-CM

## 2019-09-21 LAB — CUP PACEART REMOTE DEVICE CHECK
Battery Remaining Longevity: 171 mo
Battery Voltage: 3.09 V
Brady Statistic AP VP Percent: 0.01 %
Brady Statistic AP VS Percent: 2.2 %
Brady Statistic AS VP Percent: 0.12 %
Brady Statistic AS VS Percent: 97.66 %
Brady Statistic RA Percent Paced: 2.21 %
Brady Statistic RV Percent Paced: 0.13 %
Date Time Interrogation Session: 20210416022728
Implantable Lead Implant Date: 20200203
Implantable Lead Implant Date: 20200203
Implantable Lead Location: 753859
Implantable Lead Location: 753860
Implantable Lead Model: 5076
Implantable Lead Model: 5076
Implantable Pulse Generator Implant Date: 20200203
Lead Channel Impedance Value: 361 Ohm
Lead Channel Impedance Value: 361 Ohm
Lead Channel Impedance Value: 475 Ohm
Lead Channel Impedance Value: 570 Ohm
Lead Channel Pacing Threshold Amplitude: 0.75 V
Lead Channel Pacing Threshold Amplitude: 1 V
Lead Channel Pacing Threshold Pulse Width: 0.4 ms
Lead Channel Pacing Threshold Pulse Width: 0.4 ms
Lead Channel Sensing Intrinsic Amplitude: 5 mV
Lead Channel Sensing Intrinsic Amplitude: 5 mV
Lead Channel Sensing Intrinsic Amplitude: 7.5 mV
Lead Channel Sensing Intrinsic Amplitude: 7.5 mV
Lead Channel Setting Pacing Amplitude: 2 V
Lead Channel Setting Pacing Amplitude: 2 V
Lead Channel Setting Pacing Pulse Width: 0.4 ms
Lead Channel Setting Sensing Sensitivity: 0.9 mV

## 2019-09-21 NOTE — Progress Notes (Signed)
PPM Remote  

## 2019-11-06 ENCOUNTER — Telehealth: Payer: Self-pay

## 2019-11-06 ENCOUNTER — Encounter: Payer: Medicare Other | Admitting: Student

## 2019-11-06 NOTE — Telephone Encounter (Signed)
Spoke to PCP

## 2019-11-06 NOTE — Telephone Encounter (Signed)
Spoke to PCP and they will fax labs..  6/1

## 2019-12-12 ENCOUNTER — Telehealth: Payer: Self-pay | Admitting: *Deleted

## 2019-12-12 DIAGNOSIS — E782 Mixed hyperlipidemia: Secondary | ICD-10-CM

## 2019-12-12 NOTE — Telephone Encounter (Signed)
Lab work from [facility] reviewed by dr Stanford Breed, direct LDL = 101. Dr Stanford Breed would like the patient to start zetia 10 mg once daily with lipid/liver lab work in 12 weeks.  Left message for pt to call

## 2019-12-19 ENCOUNTER — Other Ambulatory Visit: Payer: Self-pay | Admitting: *Deleted

## 2019-12-19 DIAGNOSIS — E782 Mixed hyperlipidemia: Secondary | ICD-10-CM

## 2019-12-19 MED ORDER — EZETIMIBE 10 MG PO TABS: 10.0000 mg | ORAL_TABLET | Freq: Every day | ORAL | 3 refills | Status: DC

## 2019-12-19 NOTE — Telephone Encounter (Signed)
Spoke with pt wife, Aware of dr crenshaw's recommendations. New script sent to the pharmacy and Lab orders mailed to the pt  

## 2019-12-21 ENCOUNTER — Ambulatory Visit (INDEPENDENT_AMBULATORY_CARE_PROVIDER_SITE_OTHER): Payer: Medicare Other | Admitting: *Deleted

## 2019-12-21 DIAGNOSIS — I442 Atrioventricular block, complete: Secondary | ICD-10-CM

## 2019-12-21 LAB — CUP PACEART REMOTE DEVICE CHECK
Battery Remaining Longevity: 167 mo
Battery Voltage: 3.06 V
Brady Statistic AP VP Percent: 0.28 %
Brady Statistic AP VS Percent: 10.07 %
Brady Statistic AS VP Percent: 0.97 %
Brady Statistic AS VS Percent: 88.68 %
Brady Statistic RA Percent Paced: 10.3 %
Brady Statistic RV Percent Paced: 1.25 %
Date Time Interrogation Session: 20210716032418
Implantable Lead Implant Date: 20200203
Implantable Lead Implant Date: 20200203
Implantable Lead Location: 753859
Implantable Lead Location: 753860
Implantable Lead Model: 5076
Implantable Lead Model: 5076
Implantable Pulse Generator Implant Date: 20200203
Lead Channel Impedance Value: 380 Ohm
Lead Channel Impedance Value: 380 Ohm
Lead Channel Impedance Value: 551 Ohm
Lead Channel Impedance Value: 646 Ohm
Lead Channel Pacing Threshold Amplitude: 0.75 V
Lead Channel Pacing Threshold Amplitude: 1.375 V
Lead Channel Pacing Threshold Pulse Width: 0.4 ms
Lead Channel Pacing Threshold Pulse Width: 0.4 ms
Lead Channel Sensing Intrinsic Amplitude: 4.125 mV
Lead Channel Sensing Intrinsic Amplitude: 4.125 mV
Lead Channel Sensing Intrinsic Amplitude: 6.875 mV
Lead Channel Sensing Intrinsic Amplitude: 6.875 mV
Lead Channel Setting Pacing Amplitude: 2 V
Lead Channel Setting Pacing Amplitude: 2.75 V
Lead Channel Setting Pacing Pulse Width: 0.4 ms
Lead Channel Setting Sensing Sensitivity: 0.9 mV

## 2019-12-25 NOTE — Progress Notes (Signed)
Remote pacemaker transmission.   

## 2020-02-09 LAB — HEPATIC FUNCTION PANEL
ALT: 21 IU/L (ref 0–44)
AST: 14 IU/L (ref 0–40)
Albumin: 4.3 g/dL (ref 3.7–4.7)
Alkaline Phosphatase: 124 IU/L — ABNORMAL HIGH (ref 48–121)
Bilirubin Total: 0.4 mg/dL (ref 0.0–1.2)
Bilirubin, Direct: 0.15 mg/dL (ref 0.00–0.40)
Total Protein: 6.8 g/dL (ref 6.0–8.5)

## 2020-02-09 LAB — LIPID PANEL
Chol/HDL Ratio: 2.7 ratio (ref 0.0–5.0)
Cholesterol, Total: 107 mg/dL (ref 100–199)
HDL: 39 mg/dL — ABNORMAL LOW (ref >39)
LDL Chol Calc (NIH): 39 mg/dL (ref 0–99)
Triglycerides: 173 mg/dL — ABNORMAL HIGH (ref 0–149)
VLDL Cholesterol Cal: 29 mg/dL (ref 5–40)

## 2020-03-21 ENCOUNTER — Ambulatory Visit (INDEPENDENT_AMBULATORY_CARE_PROVIDER_SITE_OTHER): Payer: Medicare Other

## 2020-03-21 DIAGNOSIS — I442 Atrioventricular block, complete: Secondary | ICD-10-CM | POA: Diagnosis not present

## 2020-03-22 LAB — CUP PACEART REMOTE DEVICE CHECK
Battery Remaining Longevity: 162 mo
Battery Voltage: 3.04 V
Brady Statistic AP VP Percent: 0.28 %
Brady Statistic AP VS Percent: 49.25 %
Brady Statistic AS VP Percent: 0.58 %
Brady Statistic AS VS Percent: 49.89 %
Brady Statistic RA Percent Paced: 49.46 %
Brady Statistic RV Percent Paced: 0.86 %
Date Time Interrogation Session: 20211015045833
Implantable Lead Implant Date: 20200203
Implantable Lead Implant Date: 20200203
Implantable Lead Location: 753859
Implantable Lead Location: 753860
Implantable Lead Model: 5076
Implantable Lead Model: 5076
Implantable Pulse Generator Implant Date: 20200203
Lead Channel Impedance Value: 380 Ohm
Lead Channel Impedance Value: 380 Ohm
Lead Channel Impedance Value: 551 Ohm
Lead Channel Impedance Value: 551 Ohm
Lead Channel Pacing Threshold Amplitude: 0.625 V
Lead Channel Pacing Threshold Amplitude: 1.125 V
Lead Channel Pacing Threshold Pulse Width: 0.4 ms
Lead Channel Pacing Threshold Pulse Width: 0.4 ms
Lead Channel Sensing Intrinsic Amplitude: 4.5 mV
Lead Channel Sensing Intrinsic Amplitude: 4.5 mV
Lead Channel Sensing Intrinsic Amplitude: 7.625 mV
Lead Channel Sensing Intrinsic Amplitude: 7.625 mV
Lead Channel Setting Pacing Amplitude: 2 V
Lead Channel Setting Pacing Amplitude: 2.25 V
Lead Channel Setting Pacing Pulse Width: 0.4 ms
Lead Channel Setting Sensing Sensitivity: 0.9 mV

## 2020-03-26 NOTE — Progress Notes (Signed)
Remote pacemaker transmission.   

## 2020-06-20 ENCOUNTER — Ambulatory Visit (INDEPENDENT_AMBULATORY_CARE_PROVIDER_SITE_OTHER): Payer: Medicare Other

## 2020-06-20 DIAGNOSIS — I442 Atrioventricular block, complete: Secondary | ICD-10-CM

## 2020-06-23 LAB — CUP PACEART REMOTE DEVICE CHECK
Battery Remaining Longevity: 159 mo
Battery Voltage: 3.03 V
Brady Statistic AP VP Percent: 0.05 %
Brady Statistic AP VS Percent: 37.8 %
Brady Statistic AS VP Percent: 0.09 %
Brady Statistic AS VS Percent: 62.06 %
Brady Statistic RA Percent Paced: 37.76 %
Brady Statistic RV Percent Paced: 0.15 %
Date Time Interrogation Session: 20220113202934
Implantable Lead Implant Date: 20200203
Implantable Lead Implant Date: 20200203
Implantable Lead Location: 753859
Implantable Lead Location: 753860
Implantable Lead Model: 5076
Implantable Lead Model: 5076
Implantable Pulse Generator Implant Date: 20200203
Lead Channel Impedance Value: 399 Ohm
Lead Channel Impedance Value: 418 Ohm
Lead Channel Impedance Value: 551 Ohm
Lead Channel Impedance Value: 570 Ohm
Lead Channel Pacing Threshold Amplitude: 0.625 V
Lead Channel Pacing Threshold Amplitude: 1.125 V
Lead Channel Pacing Threshold Pulse Width: 0.4 ms
Lead Channel Pacing Threshold Pulse Width: 0.4 ms
Lead Channel Sensing Intrinsic Amplitude: 5 mV
Lead Channel Sensing Intrinsic Amplitude: 5 mV
Lead Channel Sensing Intrinsic Amplitude: 8.375 mV
Lead Channel Sensing Intrinsic Amplitude: 8.375 mV
Lead Channel Setting Pacing Amplitude: 2 V
Lead Channel Setting Pacing Amplitude: 2.25 V
Lead Channel Setting Pacing Pulse Width: 0.4 ms
Lead Channel Setting Sensing Sensitivity: 0.9 mV

## 2020-07-05 NOTE — Progress Notes (Signed)
Remote pacemaker transmission.   

## 2020-08-26 ENCOUNTER — Telehealth: Payer: Self-pay | Admitting: Internal Medicine

## 2020-08-26 NOTE — Telephone Encounter (Signed)
The pt states fatigue for a month. He can not walk for. I had the patient send a manual transmission with his home monitor for the nurse can review. Transmission received. I told him the nurse will call him back in 10 minutes on the [phone].

## 2020-08-26 NOTE — Telephone Encounter (Signed)
Transmission received and reviewed.  Presenting rhythm AP/VS 60 bpm. No episodes noted.   Patient has apt. With A. Tillery, PA-C on 09/02/20.   Attempted to call patient. No answer, LMOVM.

## 2020-08-26 NOTE — Telephone Encounter (Signed)
New Message:     Please call, pt is not sure if his device is working correct.

## 2020-08-28 NOTE — Telephone Encounter (Signed)
Wife of patient calling back to discuss recent transmission

## 2020-08-29 ENCOUNTER — Telehealth: Payer: Self-pay

## 2020-08-29 NOTE — Telephone Encounter (Signed)
Spoke to patients wife Arbie Cookey (DPR). Wife states the patient is not feeling any better at this time. Wife states the patient has shortness of breath while walking and "zero energy". Denies chest pain. Explained that is patient has any chest pain or increased shortness of breath that patient need to go to the ED. Patients wife reminded patient of apt. Next Tuesday time and location.

## 2020-08-31 NOTE — Progress Notes (Deleted)
Electrophysiology Office Note Date: 08/31/2020  ID:  Michael Morton, DOB March 30, 1941, MRN [id number]  PCP: Cathlean Sauer, MD Primary Cardiologist: No primary care provider on file. Electrophysiologist: None ***  CC: Pacemaker follow-up  Michael Morton is a 80 y.o. male seen today for None for {Blank single:19197::"cardiac clearance","post hospital follow up","acute visit due to ***","routine electrophysiology followup"}.  Since {Blank single:19197::"last being seen in our clinic","discharge from hospital"} the patient reports doing ***.  he denies chest pain, palpitations, dyspnea, PND, orthopnea, nausea, vomiting, dizziness, syncope, edema, weight gain, or early satiety.  Device History: {Blank single:19197::"Medtronic","St. Jude","Boston Scientific","Biotronik"} {Blank single:19197::"Dual Chamber","Single Chamber","BiV","Leadless"} PPM implanted *** for ***  Past Medical History:  Diagnosis Date  . Coronary artery disease   . Diabetes mellitus without complication ([place])   . Hypercholesteremia   . Hypertension   . Pneumonia    Past Surgical History:  Procedure Laterality Date  . APPENDECTOMY    . BACK SURGERY    . CARDIAC SURGERY    . CORONARY ARTERY BYPASS GRAFT    . KNEE SURGERY    . POSTERIOR CERVICAL LAMINECTOMY FOR EPIDURAL ABSCESS N/A 11/03/2013   Procedure: POSTERIOR CERVICAL AND THORACIC EVACUATION OF EPIDURAL ABSCESS, CERVICAL SIX AND THORACIC FOUR LAMINECTOMIES;  Surgeon: Winfield Cunas, MD;  Location: [place] NEURO ORS;  Service: Neurosurgery;  Laterality: N/A;  . ROTATOR CUFF REPAIR    . TEE WITHOUT CARDIOVERSION N/A 11/12/2013   Procedure: TRANSESOPHAGEAL ECHOCARDIOGRAM (TEE);  Surgeon: Dorothy Spark, MD;  Location: [facility] ENDOSCOPY;  Service: Cardiovascular;  Laterality: N/A;    Current Outpatient Medications  Medication Sig Dispense Refill  . acetaminophen (TYLENOL) 500 MG tablet Take 500 mg by mouth 2 (two) times daily.     Marland Kitchen acyclovir (ZOVIRAX) 400 MG tablet Take 400  mg by mouth daily.     Marland Kitchen amLODipine (NORVASC) 5 MG tablet Take 10 mg by mouth daily.     Marland Kitchen aspirin 81 MG tablet Take 81 mg by mouth daily.     Marland Kitchen atorvastatin (LIPITOR) 40 MG tablet TAKE 1 TABLET BY MOUTH ONCE DAILY    . ezetimibe (ZETIA) 10 MG tablet Take 1 tablet (10 mg total) by mouth daily. 90 tablet 3  . fenofibrate (TRICOR) 145 MG tablet Take 1 tablet by mouth daily.    . furosemide (LASIX) 20 MG tablet Take 20 mg by mouth daily.     Marland Kitchen glipiZIDE (GLUCOTROL XL) 10 MG 24 hr tablet TAKE 1 TABLET BY MOUTH ONCE DAILY    . isosorbide dinitrate (ISORDIL) 30 MG tablet Take 2 tablets (60 mg total) by mouth daily. 180 tablet 3  . losartan (COZAAR) 25 MG tablet Take 1 tablet (25 mg total) by mouth daily. 90 tablet 3  . nitroGLYCERIN (NITROSTAT) 0.4 MG SL tablet Place 0.4 mg under the tongue every 5 (five) minutes as needed for chest pain.    Marland Kitchen omeprazole (PRILOSEC) 40 MG capsule Take 1 capsule by mouth daily.    . Potassium Citrate 15 MEQ (1620 MG) TBCR TAKE 1 TABLET BY MOUTH TWICE DAILY    . sertraline (ZOLOFT) 50 MG tablet Take 50 mg by mouth daily.    . sitaGLIPtin (JANUVIA) 50 MG tablet TAKE 1 TABLET BY MOUTH ONCE DAILY    . tolterodine (DETROL LA) 4 MG 24 hr capsule Take 1 capsule by mouth daily.    . traZODone (DESYREL) 50 MG tablet Take 100 mg by mouth at bedtime.      No current facility-administered medications for this visit.  Allergies:   Ace inhibitors and Oxycodone   Social History: Social History   Socioeconomic History  . Marital status: Married    Spouse name: Not on file  . Number of children: Not on file  . Years of education: Not on file  . Highest education level: Not on file  Occupational History  . Not on file  Tobacco Use  . Smoking status: Former Research scientist (life sciences)  . Smokeless tobacco: Never Used  Substance and Sexual Activity  . Alcohol use: Yes  . Drug use: No  . Sexual activity: Not on file  Other Topics Concern  . Not on file  Social History Narrative  . Not  on file   Social Determinants of Health   Financial Resource Strain: Not on file  Food Insecurity: Not on file  Transportation Needs: Not on file  Physical Activity: Not on file  Stress: Not on file  Social Connections: Not on file  Intimate Partner Violence: Not on file    Family History: No family history on file.   Review of Systems: All other systems reviewed and are otherwise negative except as noted above.  Physical Exam: There were no vitals filed for this visit.   GEN- The patient is well appearing, alert and oriented x 3 today.   HEENT: normocephalic, atraumatic; sclera clear, conjunctiva pink; hearing intact; oropharynx clear; neck supple  Lungs- Clear to ausculation bilaterally, normal work of breathing.  No wheezes, rales, rhonchi Heart- Regular rate and rhythm, no murmurs, rubs or gallops  GI- soft, non-tender, non-distended, bowel sounds present  Extremities- no clubbing or cyanosis. No edema MS- no significant deformity or atrophy Skin- warm and dry, no rash or lesion; PPM pocket well healed Psych- euthymic mood, full affect Neuro- strength and sensation are intact  PPM Interrogation- reviewed in detail today,  See PACEART report  EKG:  EKG {ACTION; IS/IS AGT:36468032} ordered today. The ekg ordered today shows ***  Recent Labs: 02/08/2020: ALT 21   Wt Readings from Last 3 Encounters:  08/29/19 200 lb 12.8 oz (91.1 kg)  01/03/19 204 lb 6.4 oz (92.7 kg)  12/13/18 201 lb 12.8 oz (91.5 kg)     Other studies Reviewed: Additional studies/ records that were reviewed today include: Previous EP office notes, Previous remote checks, Most recent labwork.   Assessment and Plan:  1. {Blank single:19197::"SND","CHB","Advanced AV block","Tachy-Brady syndrome","Sick sinus syndrome","Symptomatic bradycardia"} s/p {Blank single:19197::"Medtronic","St. Jude","Boston Scientific","Biotronik"} PPM  Normal PPM function See Pace Art report No changes today   Current  medicines are reviewed at length with the patient today.   The patient {ACTIONS; HAS/DOES NOT HAVE:19233} concerns regarding his medicines.  The following changes were made today:  {NONE DEFAULTED:18576::"none"}  Labs/ tests ordered today include: *** No orders of the defined types were placed in this encounter.    Disposition:   Follow up with {Blank single:19197::"Dr. Allred","Dr. Arlan Organ. Klein","Dr. Camnitz","Dr. Lambert","EP APP"} in *** {Blank single:19197::"Months","Weeks"}    Signed, Shirley Friar, PA-C  08/31/2020 10:04 PM  [address] [place] [address] [phone] (office) [phone] (fax)

## 2020-09-01 NOTE — Progress Notes (Signed)
HPI: Follow-up coronary artery disease. Previously followed in [facility]. Last echocardiogram February 2020 showed ejection fraction 65 to 70% and mild left ventricular hypertrophy. Most recent cardiac catheterization April 2020 showed occluded LAD and right coronary artery; moderate stenosis of the circumflex. Patent stents in the first and second marginal. Patent LIMA to the LAD and saphenous vein graft to PDA. Saphenous vein graft to the first and second marginal occluded;ejection fraction 60%. Medical therapy recommended. Patient has had previous pacemaker for sick sinus syndrome (2/20).  Beta-blocker discontinued at last office visit due to fatigue.  Abdominal ultrasound April 2021 showed no aneurysm.  Since last seen, patient denies dyspnea, chest pain, palpitations or syncope.  He has noticed fatigue.  However he has not been very active in the past 6 months due to low back pain and knee pain.  Current Outpatient Medications  Medication Sig Dispense Refill  . acetaminophen (TYLENOL) 500 MG tablet Take 500 mg by mouth 2 (two) times daily.     Marland Kitchen acyclovir (ZOVIRAX) 400 MG tablet Take 400 mg by mouth daily.     Marland Kitchen amLODipine (NORVASC) 5 MG tablet Take 10 mg by mouth daily.     Marland Kitchen aspirin 81 MG tablet Take 81 mg by mouth daily.     Marland Kitchen atorvastatin (LIPITOR) 40 MG tablet TAKE 1 TABLET BY MOUTH ONCE DAILY    . ezetimibe (ZETIA) 10 MG tablet Take 1 tablet (10 mg total) by mouth daily. 90 tablet 3  . fenofibrate (TRICOR) 145 MG tablet Take 1 tablet by mouth daily.    . furosemide (LASIX) 20 MG tablet Take 20 mg by mouth daily.     Marland Kitchen glipiZIDE (GLUCOTROL XL) 10 MG 24 hr tablet TAKE 1 TABLET BY MOUTH ONCE DAILY    . isosorbide dinitrate (ISORDIL) 30 MG tablet Take 2 tablets (60 mg total) by mouth daily. 180 tablet 3  . losartan (COZAAR) 25 MG tablet Take 1 tablet (25 mg total) by mouth daily. 90 tablet 3  . nitroGLYCERIN (NITROSTAT) 0.4 MG SL tablet Place 0.4 mg under the tongue every 5  (five) minutes as needed for chest pain.    Marland Kitchen omeprazole (PRILOSEC) 40 MG capsule Take 1 capsule by mouth daily.    . Potassium Citrate 15 MEQ (1620 MG) TBCR TAKE 1 TABLET BY MOUTH TWICE DAILY    . sertraline (ZOLOFT) 50 MG tablet Take 50 mg by mouth daily.    . sitaGLIPtin (JANUVIA) 50 MG tablet TAKE 1 TABLET BY MOUTH ONCE DAILY    . tolterodine (DETROL LA) 4 MG 24 hr capsule Take 1 capsule by mouth daily.    . traZODone (DESYREL) 50 MG tablet Take 100 mg by mouth at bedtime.      No current facility-administered medications for this visit.     Past Medical History:  Diagnosis Date  . Coronary artery disease   . Diabetes mellitus without complication ([place])   . Hypercholesteremia   . Hypertension   . Pneumonia     Past Surgical History:  Procedure Laterality Date  . APPENDECTOMY    . BACK SURGERY    . CARDIAC SURGERY    . CORONARY ARTERY BYPASS GRAFT    . KNEE SURGERY    . POSTERIOR CERVICAL LAMINECTOMY FOR EPIDURAL ABSCESS N/A 11/03/2013   Procedure: POSTERIOR CERVICAL AND THORACIC EVACUATION OF EPIDURAL ABSCESS, CERVICAL SIX AND THORACIC FOUR LAMINECTOMIES;  Surgeon: Winfield Cunas, MD;  Location: [place] NEURO ORS;  Service: Neurosurgery;  Laterality:  N/A;  . ROTATOR CUFF REPAIR    . TEE WITHOUT CARDIOVERSION N/A 11/12/2013   Procedure: TRANSESOPHAGEAL ECHOCARDIOGRAM (TEE);  Surgeon: Dorothy Spark, MD;  Location: [facility] ENDOSCOPY;  Service: Cardiovascular;  Laterality: N/A;    Social History   Socioeconomic History  . Marital status: Married    Spouse name: Not on file  . Number of children: Not on file  . Years of education: Not on file  . Highest education level: Not on file  Occupational History  . Not on file  Tobacco Use  . Smoking status: Former Research scientist (life sciences)  . Smokeless tobacco: Never Used  Substance and Sexual Activity  . Alcohol use: Yes  . Drug use: No  . Sexual activity: Not on file  Other Topics Concern  . Not on file  Social History Narrative  . Not on file    Social Determinants of Health   Financial Resource Strain: Not on file  Food Insecurity: Not on file  Transportation Needs: Not on file  Physical Activity: Not on file  Stress: Not on file  Social Connections: Not on file  Intimate Partner Violence: Not on file    History reviewed. No pertinent family history.  ROS: Back and knee pain but no fevers or chills, productive cough, hemoptysis, dysphasia, odynophagia, melena, hematochezia, dysuria, hematuria, rash, seizure activity, orthopnea, PND, pedal edema, claudication. Remaining systems are negative.  Physical Exam: Well-developed well-nourished in no acute distress.  Skin is warm and dry.  HEENT is normal.  Neck is supple.  Chest is clear to auscultation with normal expansion.  Cardiovascular exam is regular rate and rhythm.  Abdominal exam nontender or distended. No masses palpated. Extremities show no edema. neuro grossly intact  ECG-atrial paced rhythm, first-degree AV block, right bundle branch block, inferior infarct.  Personally reviewed  A/P  1 coronary artery disease- Continue aspirin and statin.  He is having some fatigue but I think this is likely deconditioning due to decreased activity (he has had problems with low back and knee pain limiting his mobility).  He is not having chest pain.  2 hypertension-patient's blood pressure is controlled.  Continue present medications and follow.  3 hyperlipidemia-continue statin.  4 pacemaker-Per Dr. Caryl Comes.  5 renal insufficiency-managed by nephrology.  Kirk Ruths, MD

## 2020-09-02 ENCOUNTER — Ambulatory Visit: Payer: Medicare Other | Admitting: Student

## 2020-09-03 ENCOUNTER — Other Ambulatory Visit: Payer: Self-pay

## 2020-09-03 ENCOUNTER — Ambulatory Visit: Payer: Medicare Other | Admitting: Cardiology

## 2020-09-03 ENCOUNTER — Ambulatory Visit: Payer: Medicare Other | Admitting: Student

## 2020-09-03 ENCOUNTER — Encounter: Payer: Self-pay | Admitting: Cardiology

## 2020-09-03 VITALS — BP 134/68 | HR 60 | Ht 74.0 in | Wt 202.8 lb

## 2020-09-03 DIAGNOSIS — E782 Mixed hyperlipidemia: Secondary | ICD-10-CM | POA: Diagnosis not present

## 2020-09-03 DIAGNOSIS — I25118 Atherosclerotic heart disease of native coronary artery with other forms of angina pectoris: Secondary | ICD-10-CM | POA: Diagnosis not present

## 2020-09-03 DIAGNOSIS — I10 Essential (primary) hypertension: Secondary | ICD-10-CM

## 2020-09-03 NOTE — Patient Instructions (Signed)

## 2020-09-19 ENCOUNTER — Ambulatory Visit (INDEPENDENT_AMBULATORY_CARE_PROVIDER_SITE_OTHER): Payer: Medicare Other

## 2020-09-19 DIAGNOSIS — I442 Atrioventricular block, complete: Secondary | ICD-10-CM | POA: Diagnosis not present

## 2020-09-23 LAB — CUP PACEART REMOTE DEVICE CHECK
Battery Remaining Longevity: 153 mo
Battery Voltage: 3.02 V
Brady Statistic AP VP Percent: 8.98 %
Brady Statistic AP VS Percent: 89.2 %
Brady Statistic AS VP Percent: 0.07 %
Brady Statistic AS VS Percent: 1.76 %
Brady Statistic RA Percent Paced: 98.16 %
Brady Statistic RV Percent Paced: 9.04 %
Date Time Interrogation Session: 20220415145406
Implantable Lead Implant Date: 20200203
Implantable Lead Implant Date: 20200203
Implantable Lead Location: 753859
Implantable Lead Location: 753860
Implantable Lead Model: 5076
Implantable Lead Model: 5076
Implantable Pulse Generator Implant Date: 20200203
Lead Channel Impedance Value: 380 Ohm
Lead Channel Impedance Value: 399 Ohm
Lead Channel Impedance Value: 494 Ohm
Lead Channel Impedance Value: 570 Ohm
Lead Channel Pacing Threshold Amplitude: 0.75 V
Lead Channel Pacing Threshold Amplitude: 1.125 V
Lead Channel Pacing Threshold Pulse Width: 0.4 ms
Lead Channel Pacing Threshold Pulse Width: 0.4 ms
Lead Channel Sensing Intrinsic Amplitude: 4.25 mV
Lead Channel Sensing Intrinsic Amplitude: 4.25 mV
Lead Channel Sensing Intrinsic Amplitude: 7.625 mV
Lead Channel Sensing Intrinsic Amplitude: 7.625 mV
Lead Channel Setting Pacing Amplitude: 2 V
Lead Channel Setting Pacing Amplitude: 2.5 V
Lead Channel Setting Pacing Pulse Width: 0.4 ms
Lead Channel Setting Sensing Sensitivity: 0.9 mV

## 2020-09-30 ENCOUNTER — Ambulatory Visit: Payer: Medicare Other | Admitting: Cardiology

## 2020-10-06 NOTE — Progress Notes (Signed)
Remote pacemaker transmission.   

## 2020-11-11 ENCOUNTER — Ambulatory Visit: Payer: Medicare Other | Admitting: Cardiology

## 2020-11-18 ENCOUNTER — Encounter: Payer: Medicare Other | Admitting: Internal Medicine

## 2020-11-18 DIAGNOSIS — I495 Sick sinus syndrome: Secondary | ICD-10-CM | POA: Insufficient documentation

## 2020-11-18 DIAGNOSIS — Z950 Presence of cardiac pacemaker: Secondary | ICD-10-CM | POA: Insufficient documentation

## 2020-12-08 ENCOUNTER — Other Ambulatory Visit: Payer: Self-pay | Admitting: Cardiology

## 2020-12-08 DIAGNOSIS — E782 Mixed hyperlipidemia: Secondary | ICD-10-CM

## 2020-12-19 ENCOUNTER — Ambulatory Visit (INDEPENDENT_AMBULATORY_CARE_PROVIDER_SITE_OTHER): Payer: Medicare Other

## 2020-12-19 DIAGNOSIS — I442 Atrioventricular block, complete: Secondary | ICD-10-CM

## 2020-12-20 LAB — CUP PACEART REMOTE DEVICE CHECK
Battery Remaining Longevity: 146 mo
Battery Voltage: 3.01 V
Brady Statistic AP VP Percent: 37.1 %
Brady Statistic AP VS Percent: 61.35 %
Brady Statistic AS VP Percent: 0.23 %
Brady Statistic AS VS Percent: 1.32 %
Brady Statistic RA Percent Paced: 98.44 %
Brady Statistic RV Percent Paced: 37.33 %
Date Time Interrogation Session: 20220715045350
Implantable Lead Implant Date: 20200203
Implantable Lead Implant Date: 20200203
Implantable Lead Location: 753859
Implantable Lead Location: 753860
Implantable Lead Model: 5076
Implantable Lead Model: 5076
Implantable Pulse Generator Implant Date: 20200203
Lead Channel Impedance Value: 361 Ohm
Lead Channel Impedance Value: 361 Ohm
Lead Channel Impedance Value: 475 Ohm
Lead Channel Impedance Value: 513 Ohm
Lead Channel Pacing Threshold Amplitude: 0.75 V
Lead Channel Pacing Threshold Amplitude: 1.125 V
Lead Channel Pacing Threshold Pulse Width: 0.4 ms
Lead Channel Pacing Threshold Pulse Width: 0.4 ms
Lead Channel Sensing Intrinsic Amplitude: 4.125 mV
Lead Channel Sensing Intrinsic Amplitude: 4.125 mV
Lead Channel Sensing Intrinsic Amplitude: 6.5 mV
Lead Channel Sensing Intrinsic Amplitude: 6.5 mV
Lead Channel Setting Pacing Amplitude: 2 V
Lead Channel Setting Pacing Amplitude: 2.25 V
Lead Channel Setting Pacing Pulse Width: 0.4 ms
Lead Channel Setting Sensing Sensitivity: 0.9 mV

## 2021-01-12 NOTE — Progress Notes (Signed)
Remote pacemaker transmission.   

## 2021-02-04 NOTE — Progress Notes (Signed)
HPI: Follow-up coronary artery disease.  Previously followed in [facility].  Last echocardiogram February 2020 showed ejection fraction 65 to 70% and mild left ventricular hypertrophy.  Most recent cardiac catheterization April 2020 showed occluded LAD and right coronary artery; moderate stenosis of the circumflex. Patent stents in the first and second marginal. Patent LIMA to the LAD and saphenous vein graft to PDA. Saphenous vein graft to the first and second marginal occluded; ejection fraction 60%. Medical therapy recommended. Patient has had previous pacemaker for sick sinus syndrome (2/20).  Beta-blocker discontinued at previous office visit due to fatigue.  Abdominal ultrasound April 2021 showed no aneurysm.  Since last seen, patient does have fatigue.  He denies dyspnea, chest pain or syncope.  Some dizziness with standing at times.  Current Outpatient Medications  Medication Sig Dispense Refill   acetaminophen (TYLENOL) 500 MG tablet Take 500 mg by mouth 2 (two) times daily.      acyclovir (ZOVIRAX) 400 MG tablet Take 400 mg by mouth daily.      amLODipine (NORVASC) 5 MG tablet Take 10 mg by mouth daily.      aspirin 81 MG tablet Take 81 mg by mouth daily.      atorvastatin (LIPITOR) 40 MG tablet TAKE 1 TABLET BY MOUTH ONCE DAILY     ezetimibe (ZETIA) 10 MG tablet Take 1 tablet by mouth once daily 90 tablet 3   fenofibrate (TRICOR) 145 MG tablet Take 1 tablet by mouth daily.     furosemide (LASIX) 20 MG tablet Take 20 mg by mouth daily.      glipiZIDE (GLUCOTROL XL) 10 MG 24 hr tablet TAKE 1 TABLET BY MOUTH ONCE DAILY     isosorbide dinitrate (ISORDIL) 30 MG tablet Take 2 tablets (60 mg total) by mouth daily. 180 tablet 3   losartan (COZAAR) 25 MG tablet Take 1 tablet (25 mg total) by mouth daily. (Patient taking differently: Take 100 mg by mouth daily.) 90 tablet 3   nitroGLYCERIN (NITROSTAT) 0.4 MG SL tablet Place 0.4 mg under the tongue every 5 (five) minutes as needed for chest  pain.     omeprazole (PRILOSEC) 40 MG capsule Take 1 capsule by mouth daily.     Potassium Citrate 15 MEQ (1620 MG) TBCR TAKE 1 TABLET BY MOUTH TWICE DAILY     sertraline (ZOLOFT) 50 MG tablet Take 50 mg by mouth daily.     sitaGLIPtin (JANUVIA) 50 MG tablet TAKE 1 TABLET BY MOUTH ONCE DAILY     tolterodine (DETROL LA) 4 MG 24 hr capsule Take 1 capsule by mouth daily.     traZODone (DESYREL) 50 MG tablet Take 100 mg by mouth at bedtime.      No current facility-administered medications for this visit.     Past Medical History:  Diagnosis Date   Coronary artery disease    Diabetes mellitus without complication ([place])    Hypercholesteremia    Hypertension    Pneumonia     Past Surgical History:  Procedure Laterality Date   APPENDECTOMY     BACK SURGERY     CARDIAC SURGERY     CORONARY ARTERY BYPASS GRAFT     KNEE SURGERY     POSTERIOR CERVICAL LAMINECTOMY FOR EPIDURAL ABSCESS N/A 11/03/2013   Procedure: POSTERIOR CERVICAL AND THORACIC EVACUATION OF EPIDURAL ABSCESS, CERVICAL SIX AND THORACIC FOUR LAMINECTOMIES;  Surgeon: Winfield Cunas, MD;  Location: [place] NEURO ORS;  Service: Neurosurgery;  Laterality: N/A;   ROTATOR  CUFF REPAIR     TEE WITHOUT CARDIOVERSION N/A 11/12/2013   Procedure: TRANSESOPHAGEAL ECHOCARDIOGRAM (TEE);  Surgeon: Dorothy Spark, MD;  Location: [facility] ENDOSCOPY;  Service: Cardiovascular;  Laterality: N/A;    Social History   Socioeconomic History   Marital status: Married    Spouse name: Not on file   Number of children: Not on file   Years of education: Not on file   Highest education level: Not on file  Occupational History   Not on file  Tobacco Use   Smoking status: Former   Smokeless tobacco: Never  Substance and Sexual Activity   Alcohol use: Yes   Drug use: No   Sexual activity: Not on file  Other Topics Concern   Not on file  Social History Narrative   Not on file   Social Determinants of Health   Financial Resource Strain: Not on file   Food Insecurity: Not on file  Transportation Needs: Not on file  Physical Activity: Not on file  Stress: Not on file  Social Connections: Not on file  Intimate Partner Violence: Not on file    History reviewed. No pertinent family history.  ROS: no fevers or chills, productive cough, hemoptysis, dysphasia, odynophagia, melena, hematochezia, dysuria, hematuria, rash, seizure activity, orthopnea, PND, pedal edema, claudication. Remaining systems are negative.  Physical Exam: Well-developed well-nourished in no acute distress.  Skin is warm and dry.  HEENT is normal.  Neck is supple.  Chest is clear to auscultation with normal expansion.  Cardiovascular exam is regular rate and rhythm.  Abdominal exam nontender or distended. No masses palpated. Extremities show no edema. neuro grossly intact  A/P  1 coronary artery disease-patient denies chest pain.  Plan to continue medical therapy with aspirin and statin.  2 prior pacemaker-managed by electrophysiology.  3 hypertension-blood pressure controlled.  However he is having some orthostatic symptoms.  Decrease amlodipine to 5 mg daily and follow blood pressure.  Check potassium and renal function.  4 hyperlipidemia-continue statin.  Kirk Ruths, MD

## 2021-02-18 ENCOUNTER — Encounter: Payer: Self-pay | Admitting: Cardiology

## 2021-02-18 ENCOUNTER — Ambulatory Visit: Payer: Medicare Other | Admitting: Cardiology

## 2021-02-18 ENCOUNTER — Other Ambulatory Visit: Payer: Self-pay

## 2021-02-18 VITALS — BP 124/60 | HR 60 | Ht 74.0 in | Wt 198.0 lb

## 2021-02-18 DIAGNOSIS — I25118 Atherosclerotic heart disease of native coronary artery with other forms of angina pectoris: Secondary | ICD-10-CM

## 2021-02-18 DIAGNOSIS — I10 Essential (primary) hypertension: Secondary | ICD-10-CM

## 2021-02-18 DIAGNOSIS — E782 Mixed hyperlipidemia: Secondary | ICD-10-CM | POA: Diagnosis not present

## 2021-02-18 DIAGNOSIS — Z950 Presence of cardiac pacemaker: Secondary | ICD-10-CM

## 2021-02-18 MED ORDER — AMLODIPINE BESYLATE 5 MG PO TABS: 5.0000 mg | ORAL_TABLET | Freq: Every day | ORAL | 3 refills | Status: DC

## 2021-02-18 MED ORDER — EZETIMIBE 10 MG PO TABS: 10.0000 mg | ORAL_TABLET | Freq: Every day | ORAL | 3 refills | Status: DC

## 2021-02-18 NOTE — Patient Instructions (Signed)
Medication Instructions:   DECREASE Amlodipine one tablet ( 5 mg) daily.   *If you need a refill on your cardiac medications before your next appointment, please call your pharmacy*   Lab Work: TODAY!!!!! Bmet  If you have labs (blood work) drawn today and your tests are completely normal, you will receive your results only by: [facility] (if you have MyChart) OR A paper copy in the mail If you have any lab test that is abnormal or we need to change your treatment, we will call you to review the results.   Testing/Procedures:  -None Follow-Up: At [facility], you and your health needs are our priority.  As part of our continuing mission to provide you with exceptional heart care, we have created designated Provider Care Teams.  These Care Teams include your primary Cardiologist (physician) and Advanced Practice Providers (APPs -  Physician Assistants and Nurse Practitioners) who all work together to provide you with the care you need, when you need it.  We recommend signing up for the patient portal called "MyChart".  Sign up information is provided on this After Visit Summary.  MyChart is used to connect with patients for Virtual Visits (Telemedicine).  Patients are able to view lab/test results, encounter notes, upcoming appointments, etc.  Non-urgent messages can be sent to your provider as well.   To learn more about what you can do with MyChart, go to [email or web address].    Your next appointment:   6 month(s)  The format for your next appointment:   In Person  Provider:   Dr. [facility] Breed.    Other Instructions Please keep check on your blood pressure.

## 2021-02-19 LAB — BASIC METABOLIC PANEL
BUN/Creatinine Ratio: 14 (ref 10–24)
BUN: 29 mg/dL — ABNORMAL HIGH (ref 8–27)
CO2: 21 mmol/L (ref 20–29)
Calcium: 8.9 mg/dL (ref 8.6–10.2)
Chloride: 103 mmol/L (ref 96–106)
Creatinine, Ser: 2.12 mg/dL — ABNORMAL HIGH (ref 0.76–1.27)
Glucose: 178 mg/dL — ABNORMAL HIGH (ref 65–99)
Potassium: 5.3 mmol/L — ABNORMAL HIGH (ref 3.5–5.2)
Sodium: 140 mmol/L (ref 134–144)
eGFR: 31 mL/min/{1.73_m2} — ABNORMAL LOW (ref >59)

## 2021-02-20 ENCOUNTER — Other Ambulatory Visit: Payer: Self-pay

## 2021-02-20 DIAGNOSIS — Z79899 Other long term (current) drug therapy: Secondary | ICD-10-CM

## 2021-02-25 ENCOUNTER — Telehealth: Payer: Self-pay | Admitting: Cardiology

## 2021-02-25 NOTE — Telephone Encounter (Signed)
Pt's wife  wanted to call to let our office know that Michael Morton has an appt with the Nephrologist  02/26/21 at 3:40pm but wife does not know the physicians name

## 2021-02-25 NOTE — Telephone Encounter (Signed)
Attempt to return call-# listed not in service.   Will make MD and nurse aware (see recent lab results)

## 2021-03-20 ENCOUNTER — Ambulatory Visit (INDEPENDENT_AMBULATORY_CARE_PROVIDER_SITE_OTHER): Payer: Medicare Other

## 2021-03-20 DIAGNOSIS — I442 Atrioventricular block, complete: Secondary | ICD-10-CM | POA: Diagnosis not present

## 2021-03-24 LAB — CUP PACEART REMOTE DEVICE CHECK
Battery Remaining Longevity: 140 mo
Battery Voltage: 3.01 V
Brady Statistic AP VP Percent: 51.83 %
Brady Statistic AP VS Percent: 47.96 %
Brady Statistic AS VP Percent: 0.04 %
Brady Statistic AS VS Percent: 0.18 %
Brady Statistic RA Percent Paced: 99.78 %
Brady Statistic RV Percent Paced: 51.86 %
Date Time Interrogation Session: 20221014043740
Implantable Lead Implant Date: 20200203
Implantable Lead Implant Date: 20200203
Implantable Lead Location: 753859
Implantable Lead Location: 753860
Implantable Lead Model: 5076
Implantable Lead Model: 5076
Implantable Pulse Generator Implant Date: 20200203
Lead Channel Impedance Value: 380 Ohm
Lead Channel Impedance Value: 399 Ohm
Lead Channel Impedance Value: 494 Ohm
Lead Channel Impedance Value: 532 Ohm
Lead Channel Pacing Threshold Amplitude: 0.625 V
Lead Channel Pacing Threshold Amplitude: 1.125 V
Lead Channel Pacing Threshold Pulse Width: 0.4 ms
Lead Channel Pacing Threshold Pulse Width: 0.4 ms
Lead Channel Sensing Intrinsic Amplitude: 3.625 mV
Lead Channel Sensing Intrinsic Amplitude: 3.625 mV
Lead Channel Sensing Intrinsic Amplitude: 7.375 mV
Lead Channel Sensing Intrinsic Amplitude: 7.375 mV
Lead Channel Setting Pacing Amplitude: 2 V
Lead Channel Setting Pacing Amplitude: 2.25 V
Lead Channel Setting Pacing Pulse Width: 0.4 ms
Lead Channel Setting Sensing Sensitivity: 0.9 mV

## 2021-03-30 NOTE — Progress Notes (Signed)
Remote pacemaker transmission.   

## 2021-05-23 ENCOUNTER — Encounter (HOSPITAL_BASED_OUTPATIENT_CLINIC_OR_DEPARTMENT_OTHER): Payer: Self-pay | Admitting: *Deleted

## 2021-05-23 ENCOUNTER — Emergency Department (HOSPITAL_BASED_OUTPATIENT_CLINIC_OR_DEPARTMENT_OTHER)
Admission: EM | Admit: 2021-05-23 | Discharge: 2021-05-23 | Disposition: A | Discharge status: Home / Self Care | Payer: Medicare Other | Attending: Emergency Medicine | Admitting: Emergency Medicine

## 2021-05-23 ENCOUNTER — Emergency Department (HOSPITAL_BASED_OUTPATIENT_CLINIC_OR_DEPARTMENT_OTHER): Payer: Medicare Other

## 2021-05-23 ENCOUNTER — Other Ambulatory Visit: Payer: Self-pay

## 2021-05-23 DIAGNOSIS — Z7982 Long term (current) use of aspirin: Secondary | ICD-10-CM | POA: Insufficient documentation

## 2021-05-23 DIAGNOSIS — W01198A Fall on same level from slipping, tripping and stumbling with subsequent striking against other object, initial encounter: Secondary | ICD-10-CM | POA: Insufficient documentation

## 2021-05-23 DIAGNOSIS — I10 Essential (primary) hypertension: Secondary | ICD-10-CM | POA: Insufficient documentation

## 2021-05-23 DIAGNOSIS — Z950 Presence of cardiac pacemaker: Secondary | ICD-10-CM | POA: Diagnosis not present

## 2021-05-23 DIAGNOSIS — S0181XA Laceration without foreign body of other part of head, initial encounter: Secondary | ICD-10-CM

## 2021-05-23 DIAGNOSIS — I2510 Atherosclerotic heart disease of native coronary artery without angina pectoris: Secondary | ICD-10-CM | POA: Insufficient documentation

## 2021-05-23 DIAGNOSIS — S01412A Laceration without foreign body of left cheek and temporomandibular area, initial encounter: Secondary | ICD-10-CM | POA: Diagnosis not present

## 2021-05-23 DIAGNOSIS — Z79899 Other long term (current) drug therapy: Secondary | ICD-10-CM | POA: Diagnosis not present

## 2021-05-23 DIAGNOSIS — S0990XA Unspecified injury of head, initial encounter: Secondary | ICD-10-CM | POA: Diagnosis not present

## 2021-05-23 DIAGNOSIS — Z87891 Personal history of nicotine dependence: Secondary | ICD-10-CM | POA: Insufficient documentation

## 2021-05-23 DIAGNOSIS — E119 Type 2 diabetes mellitus without complications: Secondary | ICD-10-CM | POA: Insufficient documentation

## 2021-05-23 DIAGNOSIS — Z7984 Long term (current) use of oral hypoglycemic drugs: Secondary | ICD-10-CM | POA: Diagnosis not present

## 2021-05-23 MED ORDER — BACITRACIN ZINC 500 UNIT/GM EX OINT
TOPICAL_OINTMENT | Freq: Two times a day (BID) | CUTANEOUS | Status: DC
  Administered 2021-05-23: 1 via TOPICAL
  Filled 2021-05-23: qty 28.35

## 2021-05-23 MED ORDER — LIDOCAINE-EPINEPHRINE (PF) 2 %-1:200000 IJ SOLN
10.0000 mL | Freq: Once | INTRAMUSCULAR | Status: DC
  Filled 2021-05-23: qty 20

## 2021-05-23 NOTE — Discharge Instructions (Signed)
Please read and follow all provided instructions.  Your diagnoses today include:  1. Facial laceration, initial encounter   2. Injury of head, initial encounter     Tests performed today include: CT scan of your head that did not show any serious injury. Vital signs. See below for your results today.   Medications prescribed:  None  Take any prescribed medications only as directed.  Home care instructions:  Follow any educational materials contained in this packet.  BE VERY CAREFUL not to take multiple medicines containing Tylenol (also called acetaminophen). Doing so can lead to an overdose which can damage your liver and cause liver failure and possibly death.   Follow-up instructions: Please follow-up with your primary care provider in the next 7-10 days for wound recheck and suture removal.  Return instructions:  SEEK IMMEDIATE MEDICAL ATTENTION IF: There is confusion or drowsiness (although children frequently become drowsy after injury).  You cannot awaken the injured person.  You have more than one episode of vomiting.  You notice dizziness or unsteadiness which is getting worse, or inability to walk.  You have convulsions or unconsciousness.  You experience severe, persistent headaches not relieved by Tylenol. You cannot use arms or legs normally.  There are changes in pupil sizes. (This is the black center in the colored part of the eye)  There is clear or bloody discharge from the nose or ears.  You have change in speech, vision, swallowing, or understanding.  Localized weakness, numbness, tingling, or change in bowel or bladder control. You have any other emergent concerns.  Additional Information: You have had a head injury which does not appear to require admission at this time.  Your vital signs today were: BP (!) 157/79 (BP Location: Right Arm)    Pulse (!) 58    Temp (!) 97.5 F (36.4 C) (Oral)    Resp 18    Ht 6\' 2"  (1.88 m)    Wt 87.1 kg    SpO2 97%     BMI 24.65 kg/m  If your blood pressure (BP) was elevated above 135/85 this visit, please have this repeated by your doctor within one month. --------------

## 2021-05-23 NOTE — ED Triage Notes (Signed)
Pt states he was walking his dog this morning and tripped and fell. Abrasion and laceration present to left temple. Alert. States he wasn't going to come but the wound "wouldn't stop bleeding"

## 2021-05-23 NOTE — ED Provider Notes (Signed)
[facility] EMERGENCY DEPARTMENT Provider Note   CSN: [id number] Arrival date & time: 05/23/21  1324     History Chief Complaint  Patient presents with   [facility] is a 80 y.o. male.  Patient presents emergency department for evaluation of facial laceration and head injury.  Around 11 AM, patient was walking his dog.  He was carrying a another small dog that wriggled free and patient fell to the ground striking the left side of his face no loss of consciousness.  He sustained a couple lacerations.  No headache, vomiting, confusion.  Bleeding was difficult to control at home prompting emergency department visit.  No anticoagulation.  No neck pain.  No weakness, numbness, or tingling in arms or legs.  Onset of symptoms acute.  Course is constant.      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    Past Surgical History:  Procedure Laterality Date   APPENDECTOMY     BACK SURGERY     CARDIAC SURGERY     CORONARY ARTERY BYPASS GRAFT     KNEE SURGERY     POSTERIOR CERVICAL LAMINECTOMY FOR EPIDURAL ABSCESS N/A 11/03/2013   Procedure: POSTERIOR CERVICAL AND THORACIC EVACUATION OF EPIDURAL ABSCESS, CERVICAL SIX AND THORACIC FOUR LAMINECTOMIES;  Surgeon: Winfield Cunas, MD;  Location: [facility];  Service: Neurosurgery;  Laterality: N/A;   ROTATOR CUFF REPAIR     TEE WITHOUT CARDIOVERSION N/A 11/12/2013   Procedure: TRANSESOPHAGEAL ECHOCARDIOGRAM (TEE);  Surgeon: Dorothy Spark, MD;  Location: [facility];  Service: Cardiovascular;  Laterality: N/A;       No family  history on file.  Social History   Tobacco Use   Smoking status: Former   Smokeless tobacco: Never  Substance Use Topics   Alcohol use: Yes    Comment: rare   Drug use: No    Home Medications Prior to Admission medications   Medication Sig Start Date End Date Taking? Authorizing Provider  acetaminophen (TYLENOL) 500 MG tablet Take 500 mg by mouth 2 (two) times daily.     [provider]  acyclovir (ZOVIRAX) 400 MG tablet Take 400 mg by mouth daily.     [provider]  amLODipine (NORVASC) 5 MG tablet Take 1 tablet (5 mg total) by mouth daily. 02/18/21   Lelon Perla, MD  aspirin 81 MG tablet Take 81 mg by mouth daily.     [provider]  atorvastatin (LIPITOR) 40 MG tablet TAKE 1 TABLET BY MOUTH ONCE DAILY 10/28/16   [provider]  ezetimibe (ZETIA) 10 MG tablet Take 1 tablet (10 mg total) by mouth daily. 02/18/21   Lelon Perla, MD  fenofibrate (TRICOR) 145 MG tablet Take 1 tablet by mouth daily. 10/25/18   [provider]  furosemide (LASIX) 20 MG tablet Take 20 mg by mouth daily.  08/10/18   [provider]  glipiZIDE (GLUCOTROL XL) 10 MG 24 hr tablet TAKE 1 TABLET BY MOUTH ONCE DAILY 06/01/18   [provider]  isosorbide dinitrate (ISORDIL) 30 MG  tablet Take 2 tablets (60 mg total) by mouth daily. 01/03/19   Deboraha Sprang, MD  losartan (COZAAR) 25 MG tablet Take 1 tablet (25 mg total) by mouth daily. Patient taking differently: Take 100 mg by mouth daily. 01/03/19   Deboraha Sprang, MD  nitroGLYCERIN (NITROSTAT) 0.4 MG SL tablet Place 0.4 mg under the tongue every 5 (five) minutes as needed for chest pain.    [provider]  omeprazole (PRILOSEC) 40 MG capsule Take 1 capsule by mouth daily.    [provider]  Potassium Citrate 15 MEQ (1620 MG) TBCR TAKE 1 TABLET BY MOUTH TWICE DAILY 04/05/18   [provider]  sertraline (ZOLOFT) 50 MG tablet Take 50 mg by mouth daily.    [provider]  sitaGLIPtin (JANUVIA) 50 MG tablet TAKE 1 TABLET BY MOUTH ONCE DAILY 07/25/18   [provider]  tolterodine (DETROL LA) 4 MG 24 hr capsule Take 1 capsule by mouth daily. 04/05/18   [provider]  traZODone (DESYREL) 50 MG tablet Take 100 mg by mouth at bedtime.     [provider]    Allergies    Ace inhibitors and Oxycodone  Review of Systems   Review of Systems  Constitutional:  Negative for fatigue.  HENT:  Negative for tinnitus.   Eyes:  Negative for photophobia, pain and visual disturbance.  Respiratory:  Negative for shortness of breath.   Cardiovascular:  Negative for chest pain.  Gastrointestinal:  Negative for nausea and vomiting.  Musculoskeletal:  Negative for back pain, gait problem and neck pain.  Skin:  Positive for wound.  Neurological:  Negative for dizziness, weakness, light-headedness, numbness and headaches.  Psychiatric/Behavioral:  Negative for confusion and decreased concentration.    Physical Exam Updated Vital Signs BP (!) 157/79 (BP Location: Right Arm)    Pulse (!) 58    Temp (!) 97.5 F (36.4 C) (Oral)    Resp 18    Ht 6\' 2"  (1.88 m)    Wt 87.1 kg    SpO2 97%    BMI 24.65 kg/m   Physical Exam Vitals and nursing note reviewed.  Constitutional:      Appearance: He is well-developed.  HENT:     Head: Normocephalic. No raccoon eyes or Battle's sign.     Comments: Approximately 4 cm total length laceration, irregular, slightly abraded and macerated to the left lateral face superior to the lateral canthus of the eye.  There are 2 V-shaped flaps involved and is irregular laceration.  Wound base is clean.  Mild venous oozing.  Approximately 3 cm total length laceration, irregular, moderately abraded, macerated to left lateral face lateral and inferior to the lateral canthus of the eye.  Superficially, there is some skin removed.  More posteriorly there is an abrasion away from the laceration.  Wound base is clean.  It  is hemostatic.    Right Ear: Tympanic membrane, ear canal and external ear normal. No hemotympanum.     Left Ear: Tympanic membrane, ear canal and external ear normal. No hemotympanum.     Nose: Nose normal.  Eyes:     General: Lids are normal.     Conjunctiva/sclera: Conjunctivae normal.     Pupils: Pupils are equal, round, and reactive to light.     Comments: No visible hyphema  Cardiovascular:     Rate and Rhythm: Normal rate and regular rhythm.  Pulmonary:     Effort: Pulmonary effort is normal.  Breath sounds: Normal breath sounds.  Abdominal:     Palpations: Abdomen is soft.     Tenderness: There is no abdominal tenderness.  Musculoskeletal:        General: Normal range of motion.     Cervical back: Normal range of motion and neck supple. No tenderness or bony tenderness.     Thoracic back: No tenderness or bony tenderness.     Lumbar back: No tenderness or bony tenderness.  Skin:    General: Skin is warm and dry.  Neurological:     Mental Status: He is alert and oriented to person, place, and time.     GCS: GCS eye subscore is 4. GCS verbal subscore is 5. GCS motor subscore is 6.     Cranial Nerves: No cranial nerve deficit.     Sensory: No sensory deficit.     Coordination: Coordination normal.     Deep Tendon Reflexes: Reflexes are normal and symmetric.    ED Results / Procedures / Treatments   Labs (all labs ordered are listed, but only abnormal results are displayed) Labs Reviewed - No data to display  EKG None  Radiology CT Head Wo Contrast  Result Date: 05/23/2021 CLINICAL DATA:  Trauma.  Fall. EXAM: CT HEAD WITHOUT CONTRAST TECHNIQUE: Contiguous axial images were obtained from the base of the skull through the vertex without intravenous contrast. COMPARISON:  None. FINDINGS: Brain: No evidence of acute infarction, hemorrhage, hydrocephalus, extra-axial collection or mass lesion/mass effect. Vascular: Calcified atherosclerosis in the intracranial carotids.  Skull: Normal. Negative for fracture or focal lesion. Sinuses/Orbits: Opacification numerous ethmoid air cells. Mucosal thickening and fluid in the maxillary sinuses. Mucosal thickening in the sphenoid sinuses. Other: No other abnormalities. IMPRESSION: 1. No acute intracranial abnormalities. 2. Sinus disease as above. Electronically Signed   By: Dorise Bullion III M.D.   On: 05/23/2021 14:29    Procedures .Marland KitchenLaceration Repair  Date/Time: 05/23/2021 3:21 PM Performed by: Carlisle Cater, PA-C Authorized by: Carlisle Cater, PA-C   Consent:    Consent obtained:  Verbal   Consent given by:  Patient   Risks discussed:  Infection and pain Universal protocol:    Patient identity confirmed:  Verbally with patient Anesthesia:    Anesthesia method:  Local infiltration   Local anesthetic:  Lidocaine 2% WITH epi Laceration details:    Location:  Face   Face location:  L cheek   Length (cm):  7 Pre-procedure details:    Preparation:  Patient was prepped and draped in usual sterile fashion and imaging obtained to evaluate for foreign bodies Exploration:    Limited defect created (wound extended): no     Imaging obtained comment:  CT head   Wound extent: no foreign bodies/material noted and no muscle damage noted     Contaminated: no   Treatment:    Area cleansed with:  Shur-Clens   Amount of cleaning:  Standard   Debridement:  Minimal Skin repair:    Repair method:  Sutures   Suture size:  6-0   Suture material:  Nylon   Suture technique:  Simple interrupted, running locked and horizontal mattress   Number of sutures:  12 Approximation:    Approximation:  Close Repair type:    Repair type:  Simple Post-procedure details:    Dressing:  Open (no dressing)   Procedure completion:  Tolerated well, no immediate complications   Medications Ordered in ED Medications  lidocaine-EPINEPHrine (XYLOCAINE W/EPI) 2 %-1:200000 (PF) injection 10 mL (has no administration  in time range)   bacitracin ointment (1 application Topical Given 05/23/21 1456)    ED Course  I have reviewed the triage vital signs and the nursing notes.  Pertinent labs & imaging results that were available during my care of the patient were reviewed by me and considered in my medical decision making (see chart for details).  Patient seen and examined. Plan discussed with patient.   Labs: None  Imaging: Head CT  Medications/Fluids: Tetanus booster  Vital signs reviewed and are as follows: BP (!) 157/79 (BP Location: Right Arm)    Pulse (!) 58    Temp (!) 97.5 F (36.4 C) (Oral)    Resp 18    Ht 6\' 2"  (1.88 m)    Wt 87.1 kg    SpO2 97%    BMI 24.65 kg/m   Initial impression: Minor head injury, patient laceration  3:20 PM CT was reviewed personally.  Results reviewed.  Wound repaired as best as possible.  Extensive cleaning however wound was fairly clean.  Patient counseled on wound care. Patient counseled on need to return or see PCP/urgent care for suture removal in 7-10 days. Patient was urged to return to the Emergency Department urgently with worsening pain, swelling, expanding erythema especially if it streaks away from the affected area, fever, or if they have any other concerns. Patient verbalized understanding.   Patient was counseled on head injury precautions and symptoms that should indicate their return to the ED.  These include severe worsening headache, vision changes, confusion, loss of consciousness, trouble walking, nausea & vomiting, or weakness/tingling in extremities.      MDM Rules/Calculators/A&P                          Patient with mechanical fall while walking his dogs leading to head injury with abraded and macerated wound to the left face.  These wounds were cleaned and repaired as best as possible.  CT of the head was negative.  No concerns for significant neck injury or other injuries today.  Patient did well with wound closure.  No decompensation during ED stay.   Follow-up instructions as above.       Final Clinical Impression(s) / ED Diagnoses Final diagnoses:  Facial laceration, initial encounter  Injury of head, initial encounter    Rx / DC Orders ED Discharge Orders     None        Carlisle Cater, PA-C 05/23/21 1523    Hayden Rasmussen, MD 05/23/21 1745

## 2021-05-23 NOTE — ED Notes (Signed)
Patient transported to CT 

## 2021-06-19 ENCOUNTER — Ambulatory Visit (INDEPENDENT_AMBULATORY_CARE_PROVIDER_SITE_OTHER): Payer: Medicare Other

## 2021-06-19 DIAGNOSIS — I442 Atrioventricular block, complete: Secondary | ICD-10-CM | POA: Diagnosis not present

## 2021-06-19 LAB — CUP PACEART REMOTE DEVICE CHECK
Battery Remaining Longevity: 134 mo
Battery Voltage: 3.01 V
Brady Statistic AP VP Percent: 31.65 %
Brady Statistic AP VS Percent: 67.73 %
Brady Statistic AS VP Percent: 0.05 %
Brady Statistic AS VS Percent: 0.57 %
Brady Statistic RA Percent Paced: 99.37 %
Brady Statistic RV Percent Paced: 31.7 %
Date Time Interrogation Session: 20230112192717
Implantable Lead Implant Date: 20200203
Implantable Lead Implant Date: 20200203
Implantable Lead Location: 753859
Implantable Lead Location: 753860
Implantable Lead Model: 5076
Implantable Lead Model: 5076
Implantable Pulse Generator Implant Date: 20200203
Lead Channel Impedance Value: 380 Ohm
Lead Channel Impedance Value: 399 Ohm
Lead Channel Impedance Value: 475 Ohm
Lead Channel Impedance Value: 532 Ohm
Lead Channel Pacing Threshold Amplitude: 0.625 V
Lead Channel Pacing Threshold Amplitude: 1.25 V
Lead Channel Pacing Threshold Pulse Width: 0.4 ms
Lead Channel Pacing Threshold Pulse Width: 0.4 ms
Lead Channel Sensing Intrinsic Amplitude: 4.375 mV
Lead Channel Sensing Intrinsic Amplitude: 4.375 mV
Lead Channel Sensing Intrinsic Amplitude: 6.125 mV
Lead Channel Sensing Intrinsic Amplitude: 6.125 mV
Lead Channel Setting Pacing Amplitude: 2 V
Lead Channel Setting Pacing Amplitude: 2.5 V
Lead Channel Setting Pacing Pulse Width: 0.4 ms
Lead Channel Setting Sensing Sensitivity: 0.9 mV

## 2021-06-30 NOTE — Progress Notes (Signed)
Remote pacemaker transmission.   

## 2021-07-09 ENCOUNTER — Encounter (HOSPITAL_BASED_OUTPATIENT_CLINIC_OR_DEPARTMENT_OTHER): Payer: Self-pay | Admitting: Emergency Medicine

## 2021-07-09 ENCOUNTER — Other Ambulatory Visit: Payer: Self-pay

## 2021-07-09 ENCOUNTER — Other Ambulatory Visit (HOSPITAL_BASED_OUTPATIENT_CLINIC_OR_DEPARTMENT_OTHER): Payer: Self-pay

## 2021-07-09 ENCOUNTER — Emergency Department (HOSPITAL_BASED_OUTPATIENT_CLINIC_OR_DEPARTMENT_OTHER)
Admission: EM | Admit: 2021-07-09 | Discharge: 2021-07-09 | Disposition: A | Discharge status: Home / Self Care | Payer: Medicare Other | Attending: Emergency Medicine | Admitting: Emergency Medicine

## 2021-07-09 DIAGNOSIS — E119 Type 2 diabetes mellitus without complications: Secondary | ICD-10-CM | POA: Insufficient documentation

## 2021-07-09 DIAGNOSIS — M792 Neuralgia and neuritis, unspecified: Secondary | ICD-10-CM

## 2021-07-09 DIAGNOSIS — Z7982 Long term (current) use of aspirin: Secondary | ICD-10-CM | POA: Insufficient documentation

## 2021-07-09 DIAGNOSIS — M79672 Pain in left foot: Secondary | ICD-10-CM | POA: Diagnosis present

## 2021-07-09 MED ORDER — GABAPENTIN 300 MG PO CAPS
300.0000 mg | ORAL_CAPSULE | Freq: Once | ORAL | Status: AC
  Administered 2021-07-09: 300 mg via ORAL
  Filled 2021-07-09: qty 1

## 2021-07-09 MED ORDER — GABAPENTIN 300 MG PO CAPS
ORAL_CAPSULE | ORAL | 0 refills | Status: DC
  Filled 2021-07-09: qty 90, 30d supply, fill #0

## 2021-07-09 NOTE — ED Triage Notes (Signed)
Pt c/o shooting pain to LT foot since last night; sts it is happening about every 5 mins

## 2021-07-09 NOTE — ED Provider Notes (Signed)
[facility]  Provider Note  CSN: [id number] Arrival date & time: 07/09/21 7062  History Chief Complaint  Patient presents with   Foot Pain    [facility] is a 81 y.o. male with history of DM and peripheral neuropathy reports several hours of intermittent, fleeting, sharp/electric pain in his L foot shooting down his 1st toe, no injury. Similar to previous but usually stops after a few hours. Not currently on any medications for neuropathy.    Home Medications Prior to Admission medications   Medication Sig Start Date End Date Taking? Authorizing Provider  gabapentin (NEURONTIN) 300 MG capsule Take 1 capsule (300 mg total) by mouth 2 (two) times daily for 1 day, THEN 1 capsule (300 mg total) 3 (three) times daily. 07/09/21 08/08/21 Yes Truddie Hidden, MD  acetaminophen (TYLENOL) 500 MG tablet Take 500 mg by mouth 2 (two) times daily.     [provider]  acyclovir (ZOVIRAX) 400 MG tablet Take 400 mg by mouth daily.     [provider]  amLODipine (NORVASC) 5 MG tablet Take 1 tablet (5 mg total) by mouth daily. 02/18/21   Lelon Perla, MD  aspirin 81 MG tablet Take 81 mg by mouth daily.     [provider]  atorvastatin (LIPITOR) 40 MG tablet TAKE 1 TABLET BY MOUTH ONCE DAILY 10/28/16   [provider]  ezetimibe (ZETIA) 10 MG tablet Take 1 tablet (10 mg total) by mouth daily. 02/18/21   Lelon Perla, MD  fenofibrate (TRICOR) 145 MG tablet Take 1 tablet by mouth daily. 10/25/18   [provider]  furosemide (LASIX) 20 MG tablet Take 20 mg by mouth daily.  08/10/18   [provider]  glipiZIDE (GLUCOTROL XL) 10 MG 24 hr tablet TAKE 1 TABLET BY MOUTH ONCE DAILY 06/01/18   [provider]  isosorbide dinitrate (ISORDIL) 30 MG tablet Take 2 tablets (60 mg total) by mouth daily. 01/03/19   Deboraha Sprang, MD  losartan (COZAAR) 25 MG tablet Take 1 tablet (25 mg total) by mouth daily. Patient  taking differently: Take 100 mg by mouth daily. 01/03/19   Deboraha Sprang, MD  nitroGLYCERIN (NITROSTAT) 0.4 MG SL tablet Place 0.4 mg under the tongue every 5 (five) minutes as needed for chest pain.    [provider]  omeprazole (PRILOSEC) 40 MG capsule Take 1 capsule by mouth daily.    [provider]  Potassium Citrate 15 MEQ (1620 MG) TBCR TAKE 1 TABLET BY MOUTH TWICE DAILY 04/05/18   [provider]  sertraline (ZOLOFT) 50 MG tablet Take 50 mg by mouth daily.    [provider]  sitaGLIPtin (JANUVIA) 50 MG tablet TAKE 1 TABLET BY MOUTH ONCE DAILY 07/25/18   [provider]  tolterodine (DETROL LA) 4 MG 24 hr capsule Take 1 capsule by mouth daily. 04/05/18   [provider]  traZODone (DESYREL) 50 MG tablet Take 100 mg by mouth at bedtime.     [provider]     Allergies    Ace inhibitors and Oxycodone   Review of Systems   Review of Systems Please see HPI for pertinent positives and negatives  Physical Exam BP (!) 162/71    Pulse (!) 58    Temp 97.7 F (36.5 C) (Oral)    Resp 16    Ht 6\' 2"  (1.88 m)    Wt 86.6 kg    SpO2 97%  BMI 24.52 kg/m   Physical Exam Vitals and nursing note reviewed.  HENT:     Head: Normocephalic.     Nose: Nose normal.  Eyes:     Extraocular Movements: Extraocular movements intact.  Pulmonary:     Effort: Pulmonary effort is normal.  Musculoskeletal:        General: No swelling, tenderness, deformity or signs of injury. Normal range of motion.     Cervical back: Neck supple.     Comments: Normal pulse in R foot, warm, dry with good color.   Skin:    Findings: No rash (on exposed skin).  Neurological:     Mental Status: He is alert and oriented to person, place, and time.  Psychiatric:        Mood and Affect: Mood normal.    ED Results / Procedures / Treatments   EKG None  Procedures Procedures  Medications Ordered in the ED Medications  gabapentin (NEURONTIN) capsule  300 mg (300 mg Oral Given 07/09/21 1202)    Initial Impression and Plan  Patient had a couple of instances of the pain while I was in the room. Lasts for only a few seconds. Suspect this is a neuropathic pain. Will begin Gabapentin, recommend PCP and neurology follow up for recheck and dose escalation if needed.   ED Course       MDM Rules/Calculators/A&P Medical Decision Making Problems Addressed: Neuropathic pain of right foot: undiagnosed new problem with uncertain prognosis  Risk Prescription drug management.    Final Clinical Impression(s) / ED Diagnoses Final diagnoses:  Neuropathic pain of right foot    Rx / DC Orders ED Discharge Orders          Ordered    gabapentin (NEURONTIN) 300 MG capsule        07/09/21 1201             Truddie Hidden, MD 07/09/21 1202

## 2021-08-18 NOTE — Progress Notes (Deleted)
? ? ? ? ?[id number] coronary artery disease.  Previously followed in [facility].  Last echocardiogram February 2020 showed ejection fraction 65 to 70% and mild left ventricular hypertrophy.  Most recent cardiac catheterization April 2020 showed occluded LAD and right coronary artery; moderate stenosis of the circumflex. Patent stents in the first and second marginal. Patent LIMA to the LAD and saphenous vein graft to PDA. Saphenous vein graft to the first and second marginal occluded; ejection fraction 60%. Medical therapy recommended. Patient has had previous pacemaker for sick sinus syndrome (2/20).  Beta-blocker discontinued at previous office visit due to fatigue.  Abdominal ultrasound April 2021 showed no aneurysm.  Since last seen,  ? ?Current Outpatient Medications  ?Medication Sig Dispense Refill  ? acetaminophen (TYLENOL) 500 MG tablet Take 500 mg by mouth 2 (two) times daily.     ? acyclovir (ZOVIRAX) 400 MG tablet Take 400 mg by mouth daily.     ? amLODipine (NORVASC) 5 MG tablet Take 1 tablet (5 mg total) by mouth daily. 90 tablet 3  ? aspirin 81 MG tablet Take 81 mg by mouth daily.     ? atorvastatin (LIPITOR) 40 MG tablet TAKE 1 TABLET BY MOUTH ONCE DAILY    ? ezetimibe (ZETIA) 10 MG tablet Take 1 tablet (10 mg total) by mouth daily. 90 tablet 3  ? fenofibrate (TRICOR) 145 MG tablet Take 1 tablet by mouth daily.    ? furosemide (LASIX) 20 MG tablet Take 20 mg by mouth daily.     ? gabapentin (NEURONTIN) 300 MG capsule Take 1 capsule (300 mg total) by mouth 2 (two) times daily for 1 day, THEN 1 capsule (300 mg total) 3 (three) times daily. 90 capsule 0  ? glipiZIDE (GLUCOTROL XL) 10 MG 24 hr tablet TAKE 1 TABLET BY MOUTH ONCE DAILY    ? isosorbide dinitrate (ISORDIL) 30 MG tablet Take 2 tablets (60 mg total) by mouth daily. 180 tablet 3  ? losartan (COZAAR) 25 MG tablet Take 1 tablet (25 mg total) by mouth daily. (Patient taking differently: Take 100 mg by mouth daily.) 90 tablet 3  ? nitroGLYCERIN  (NITROSTAT) 0.4 MG SL tablet Place 0.4 mg under the tongue every 5 (five) minutes as needed for chest pain.    ? omeprazole (PRILOSEC) 40 MG capsule Take 1 capsule by mouth daily.    ? Potassium Citrate 15 MEQ (1620 MG) TBCR TAKE 1 TABLET BY MOUTH TWICE DAILY    ? sertraline (ZOLOFT) 50 MG tablet Take 50 mg by mouth daily.    ? sitaGLIPtin (JANUVIA) 50 MG tablet TAKE 1 TABLET BY MOUTH ONCE DAILY    ? tolterodine (DETROL LA) 4 MG 24 hr capsule Take 1 capsule by mouth daily.    ? traZODone (DESYREL) 50 MG tablet Take 100 mg by mouth at bedtime.     ? ?No current facility-administered medications for this visit.  ? ? ? ?Past Medical History:  ?Diagnosis Date  ? Coronary artery disease   ? Diabetes mellitus without complication ([place])   ? Hypercholesteremia   ? Hypertension   ? Pneumonia   ? ? ?Past Surgical History:  ?Procedure Laterality Date  ? APPENDECTOMY    ? BACK SURGERY    ? CARDIAC SURGERY    ? CORONARY ARTERY BYPASS GRAFT    ? KNEE SURGERY    ? POSTERIOR CERVICAL LAMINECTOMY FOR EPIDURAL ABSCESS N/A 11/03/2013  ? Procedure: POSTERIOR CERVICAL AND THORACIC EVACUATION OF EPIDURAL ABSCESS, CERVICAL SIX AND THORACIC FOUR  LAMINECTOMIES;  Surgeon: Winfield Cunas, MD;  Location: [facility];  Service: Neurosurgery;  Laterality: N/A;  ? ROTATOR CUFF REPAIR    ? TEE WITHOUT CARDIOVERSION N/A 11/12/2013  ? Procedure: TRANSESOPHAGEAL ECHOCARDIOGRAM (TEE);  Surgeon: Dorothy Spark, MD;  Location: [facility];  Service: Cardiovascular;  Laterality: N/A;  ? ? ?Social History  ? ?Socioeconomic History  ? Marital status: Married  ?  Spouse name: Not on file  ? Number of children: Not on file  ? Years of education: Not on file  ? Highest education level: Not on file  ?Occupational History  ? Not on file  ?Tobacco Use  ? Smoking status: Former  ? Smokeless tobacco: Never  ?Substance and Sexual Activity  ? Alcohol use: Yes  ?  Comment: rare  ? Drug use: No  ? Sexual activity: Not on file  ?Other Topics Concern  ? Not on file   ?Social History Narrative  ? Not on file  ? ?Social Determinants of Health  ? ?Financial Resource Strain: Not on file  ?Food Insecurity: Not on file  ?Transportation Needs: Not on file  ?Physical Activity: Not on file  ?Stress: Not on file  ?Social Connections: Not on file  ?Intimate Partner Violence: Not on file  ? ? ?No family history on file. ? ?ROS: no fevers or chills, productive cough, hemoptysis, dysphasia, odynophagia, melena, hematochezia, dysuria, hematuria, rash, seizure activity, orthopnea, PND, pedal edema, claudication. Remaining systems are negative. ? ?Physical Exam: ?Well-developed well-nourished in no acute distress.  ?Skin is warm and dry.  ?HEENT is normal.  ?Neck is supple.  ?Chest is clear to auscultation with normal expansion.  ?Cardiovascular exam is regular rate and rhythm.  ?Abdominal exam nontender or distended. No masses palpated. ?Extremities show no edema. ?neuro grossly intact ? ?ECG- personally reviewed ? ?A/P ? ?1 coronary artery disease-patient doing well with no chest pain.  Plan to continue aspirin and statin. ? ?2 hypertension-patient's blood pressure is controlled.  Continue present medical regimen and follow-up. ? ?3 hyperlipidemia-continue statin. ? ?4 pacemaker-followed by EP. ? ?Kirk Ruths, MD ? ? ? ?

## 2021-08-26 ENCOUNTER — Ambulatory Visit: Payer: Medicare Other | Admitting: Cardiology

## 2021-09-18 ENCOUNTER — Ambulatory Visit (INDEPENDENT_AMBULATORY_CARE_PROVIDER_SITE_OTHER): Payer: Medicare Other

## 2021-09-18 DIAGNOSIS — I442 Atrioventricular block, complete: Secondary | ICD-10-CM | POA: Diagnosis not present

## 2021-09-18 LAB — CUP PACEART REMOTE DEVICE CHECK
Battery Remaining Longevity: 132 mo
Battery Voltage: 3 V
Brady Statistic AP VP Percent: 40.68 %
Brady Statistic AP VS Percent: 58.79 %
Brady Statistic AS VP Percent: 0.15 %
Brady Statistic AS VS Percent: 0.38 %
Brady Statistic RA Percent Paced: 99.46 %
Brady Statistic RV Percent Paced: 40.83 %
Date Time Interrogation Session: 20230414041146
Implantable Lead Implant Date: 20200203
Implantable Lead Implant Date: 20200203
Implantable Lead Location: 753859
Implantable Lead Location: 753860
Implantable Lead Model: 5076
Implantable Lead Model: 5076
Implantable Pulse Generator Implant Date: 20200203
Lead Channel Impedance Value: 380 Ohm
Lead Channel Impedance Value: 399 Ohm
Lead Channel Impedance Value: 494 Ohm
Lead Channel Impedance Value: 513 Ohm
Lead Channel Pacing Threshold Amplitude: 0.625 V
Lead Channel Pacing Threshold Amplitude: 1 V
Lead Channel Pacing Threshold Pulse Width: 0.4 ms
Lead Channel Pacing Threshold Pulse Width: 0.4 ms
Lead Channel Sensing Intrinsic Amplitude: 3.875 mV
Lead Channel Sensing Intrinsic Amplitude: 3.875 mV
Lead Channel Sensing Intrinsic Amplitude: 5.625 mV
Lead Channel Sensing Intrinsic Amplitude: 5.625 mV
Lead Channel Setting Pacing Amplitude: 2 V
Lead Channel Setting Pacing Amplitude: 2 V
Lead Channel Setting Pacing Pulse Width: 0.4 ms
Lead Channel Setting Sensing Sensitivity: 0.9 mV

## 2021-10-06 NOTE — Progress Notes (Signed)
Remote pacemaker transmission.   

## 2021-12-18 ENCOUNTER — Ambulatory Visit (INDEPENDENT_AMBULATORY_CARE_PROVIDER_SITE_OTHER): Payer: Medicare Other

## 2021-12-18 DIAGNOSIS — I442 Atrioventricular block, complete: Secondary | ICD-10-CM

## 2021-12-18 LAB — CUP PACEART REMOTE DEVICE CHECK
Battery Remaining Longevity: 126 mo
Battery Voltage: 3 V
Brady Statistic AP VP Percent: 67.27 %
Brady Statistic AP VS Percent: 31.63 %
Brady Statistic AS VP Percent: 0.65 %
Brady Statistic AS VS Percent: 0.45 %
Brady Statistic RA Percent Paced: 98.88 %
Brady Statistic RV Percent Paced: 67.92 %
Date Time Interrogation Session: 20230714035502
Implantable Lead Implant Date: 20200203
Implantable Lead Implant Date: 20200203
Implantable Lead Location: 753859
Implantable Lead Location: 753860
Implantable Lead Model: 5076
Implantable Lead Model: 5076
Implantable Pulse Generator Implant Date: 20200203
Lead Channel Impedance Value: 361 Ohm
Lead Channel Impedance Value: 380 Ohm
Lead Channel Impedance Value: 456 Ohm
Lead Channel Impedance Value: 494 Ohm
Lead Channel Pacing Threshold Amplitude: 0.625 V
Lead Channel Pacing Threshold Amplitude: 0.875 V
Lead Channel Pacing Threshold Pulse Width: 0.4 ms
Lead Channel Pacing Threshold Pulse Width: 0.4 ms
Lead Channel Sensing Intrinsic Amplitude: 3.5 mV
Lead Channel Sensing Intrinsic Amplitude: 3.5 mV
Lead Channel Sensing Intrinsic Amplitude: 6.75 mV
Lead Channel Sensing Intrinsic Amplitude: 6.75 mV
Lead Channel Setting Pacing Amplitude: 2 V
Lead Channel Setting Pacing Amplitude: 2 V
Lead Channel Setting Pacing Pulse Width: 0.4 ms
Lead Channel Setting Sensing Sensitivity: 0.9 mV

## 2021-12-21 NOTE — Progress Notes (Deleted)
HPI: Follow-up coronary artery disease.  Previously followed in [facility].  Last echocardiogram February 2020 showed ejection fraction 65 to 70% and mild left ventricular hypertrophy.  Most recent cardiac catheterization April 2020 showed occluded LAD and right coronary artery; moderate stenosis of the circumflex. Patent stents in the first and second marginal. Patent LIMA to the LAD and saphenous vein graft to PDA. Saphenous vein graft to the first and second marginal occluded; ejection fraction 60%. Medical therapy recommended. Patient has had previous pacemaker for sick sinus syndrome (2/20).  Beta-blocker discontinued at previous office visit due to fatigue.  Abdominal ultrasound April 2021 showed no aneurysm.  Since last seen,   Current Outpatient Medications  Medication Sig Dispense Refill   acetaminophen (TYLENOL) 500 MG tablet Take 500 mg by mouth 2 (two) times daily.      acyclovir (ZOVIRAX) 400 MG tablet Take 400 mg by mouth daily.      amLODipine (NORVASC) 5 MG tablet Take 1 tablet (5 mg total) by mouth daily. 90 tablet 3   aspirin 81 MG tablet Take 81 mg by mouth daily.      atorvastatin (LIPITOR) 40 MG tablet TAKE 1 TABLET BY MOUTH ONCE DAILY     ezetimibe (ZETIA) 10 MG tablet Take 1 tablet (10 mg total) by mouth daily. 90 tablet 3   fenofibrate (TRICOR) 145 MG tablet Take 1 tablet by mouth daily.     furosemide (LASIX) 20 MG tablet Take 20 mg by mouth daily.      gabapentin (NEURONTIN) 300 MG capsule Take 1 capsule (300 mg total) by mouth 2 (two) times daily for 1 day, THEN 1 capsule (300 mg total) 3 (three) times daily. 90 capsule 0   glipiZIDE (GLUCOTROL XL) 10 MG 24 hr tablet TAKE 1 TABLET BY MOUTH ONCE DAILY     isosorbide dinitrate (ISORDIL) 30 MG tablet Take 2 tablets (60 mg total) by mouth daily. 180 tablet 3   losartan (COZAAR) 25 MG tablet Take 1 tablet (25 mg total) by mouth daily. (Patient taking differently: Take 100 mg by mouth daily.) 90 tablet 3   nitroGLYCERIN  (NITROSTAT) 0.4 MG SL tablet Place 0.4 mg under the tongue every 5 (five) minutes as needed for chest pain.     omeprazole (PRILOSEC) 40 MG capsule Take 1 capsule by mouth daily.     Potassium Citrate 15 MEQ (1620 MG) TBCR TAKE 1 TABLET BY MOUTH TWICE DAILY     sertraline (ZOLOFT) 50 MG tablet Take 50 mg by mouth daily.     sitaGLIPtin (JANUVIA) 50 MG tablet TAKE 1 TABLET BY MOUTH ONCE DAILY     tolterodine (DETROL LA) 4 MG 24 hr capsule Take 1 capsule by mouth daily.     traZODone (DESYREL) 50 MG tablet Take 100 mg by mouth at bedtime.      No current facility-administered medications for this visit.     Past Medical History:  Diagnosis Date   Coronary artery disease    Diabetes mellitus without complication ([place])    Hypercholesteremia    Hypertension    Pneumonia     Past Surgical History:  Procedure Laterality Date   APPENDECTOMY     BACK SURGERY     CARDIAC SURGERY     CORONARY ARTERY BYPASS GRAFT     KNEE SURGERY     POSTERIOR CERVICAL LAMINECTOMY FOR EPIDURAL ABSCESS N/A 11/03/2013   Procedure: POSTERIOR CERVICAL AND THORACIC EVACUATION OF EPIDURAL ABSCESS, CERVICAL SIX AND THORACIC  FOUR LAMINECTOMIES;  Surgeon: Winfield Cunas, MD;  Location: [place] [facility];  Service: Neurosurgery;  Laterality: N/A;   ROTATOR CUFF REPAIR     TEE WITHOUT CARDIOVERSION N/A 11/12/2013   Procedure: TRANSESOPHAGEAL ECHOCARDIOGRAM (TEE);  Surgeon: Dorothy Spark, MD;  Location: [facility];  Service: Cardiovascular;  Laterality: N/A;    Social History   Socioeconomic History   Marital status: Married    Spouse name: Not on file   Number of children: Not on file   Years of education: Not on file   Highest education level: Not on file  Occupational History   Not on file  Tobacco Use   Smoking status: Former   Smokeless tobacco: Never  Substance and Sexual Activity   Alcohol use: Yes    Comment: rare   Drug use: No   Sexual activity: Not on file  Other Topics Concern   Not on file   Social History Narrative   Not on file   Social Determinants of Health   Financial Resource Strain: Not on file  Food Insecurity: Not on file  Transportation Needs: Not on file  Physical Activity: Not on file  Stress: Not on file  Social Connections: Not on file  Intimate Partner Violence: Not on file    No family history on file.  ROS: no fevers or chills, productive cough, hemoptysis, dysphasia, odynophagia, melena, hematochezia, dysuria, hematuria, rash, seizure activity, orthopnea, PND, pedal edema, claudication. Remaining systems are negative.  Physical Exam: Well-developed well-nourished in no acute distress.  Skin is warm and dry.  HEENT is normal.  Neck is supple.  Chest is clear to auscultation with normal expansion.  Cardiovascular exam is regular rate and rhythm.  Abdominal exam nontender or distended. No masses palpated. Extremities show no edema. neuro grossly intact  ECG- personally reviewed  A/P  1 coronary artery disease-patient has not had recurrent chest pain.  Continue aspirin and statin.  2 hypertension-patient's blood pressure is controlled today.  Continue present medical regimen.  Note he did have some orthostatic symptoms in the past.  We will therefore  3 hyperlipidemia-continue statin.  4 history of pacemaker-Per EP.  Kirk Ruths, MD

## 2021-12-30 ENCOUNTER — Ambulatory Visit: Payer: Medicare Other | Admitting: Cardiology

## 2021-12-31 NOTE — Progress Notes (Signed)
Remote pacemaker transmission.   

## 2022-01-14 ENCOUNTER — Telehealth: Payer: Self-pay

## 2022-01-14 NOTE — Telephone Encounter (Signed)
S/w the pt's wife and she has scheduled an appt for pre op clearance 01/19/22 @ 8:50 at the [facility] location. Pt and his wife aware for [facility] location for appt.

## 2022-01-14 NOTE — Telephone Encounter (Signed)
   Pre-operative Risk Assessment    Patient Name: Michael Morton  DOB: [birth year] MRN: [id number]      Request for Surgical Clearance    Procedure:   Bilateral upper eyelid blepharoplasty and bilateral upper eyelid ptosis repair   Date of Surgery:  Clearance 01/25/22                                 Surgeon:   Surgeon's Group or Practice Name:  [facility]  Phone number:  [phone] Fax number:  [phone]   Type of Clearance Requested:   - Medical  - Pharmacy:  Hold Aspirin instructions needed    Type of Anesthesia:  Not Indicated   Additional requests/questions:    [name]   01/14/2022, 1:09 PM

## 2022-01-14 NOTE — Telephone Encounter (Addendum)
   Name: Michael Morton  DOB: 11/04/1940  MRN: [id number]  Primary Cardiologist: Dr. Kirk Ruths  Chart reviewed as part of pre-operative protocol coverage. Because of Coner Willhite's past medical history and time since last visit, he will require a follow-up in-office visit in order to better assess preoperative cardiovascular risk.  Given age, medical history, and elective procedure, and coming up on almost 1 year since last visit, recommend OV for pre-op evaluation. Last OV 02/2021 had suggested a 6 month f/u that did not occur.  Pre-op covering staff: - Please schedule appointment and call patient to inform them. If patient already had an upcoming appointment within acceptable timeframe, please add "pre-op clearance" to the appointment notes so provider is aware. - Please contact requesting surgeon's office via preferred method (i.e, phone, fax) to inform them of need for appointment prior to surgery.  This message will also be routed to Dr. Stanford Breed for input on holding aspirin as requested below so that this information is available to the clearing provider at time of patient's appointment. Per last note, patient has history of CAD previously followed in [place] with prior CABG. Dr. Jacalyn Lefevre note states "Most recent cardiac catheterization April 2020 showed occluded LAD and right coronary artery; moderate stenosis of the circumflex. Patent stents in the first and second marginal. Patent LIMA to the LAD and saphenous vein graft to PDA. Saphenous vein graft to the first and second marginal occluded; ejection fraction 60%. Medical therapy recommended." Dr. Stanford Breed - if patient stable at f/u OV, may he hold aspirin for blepharoplasty? Thank you.  Charlie Pitter, PA-C  01/14/2022, 1:33 PM

## 2022-01-19 ENCOUNTER — Ambulatory Visit: Payer: Medicare Other | Admitting: Physician Assistant

## 2022-01-19 ENCOUNTER — Encounter: Payer: Self-pay | Admitting: Physician Assistant

## 2022-01-19 VITALS — BP 134/66 | HR 60 | Ht 74.0 in | Wt 194.2 lb

## 2022-01-19 DIAGNOSIS — I10 Essential (primary) hypertension: Secondary | ICD-10-CM

## 2022-01-19 DIAGNOSIS — E785 Hyperlipidemia, unspecified: Secondary | ICD-10-CM

## 2022-01-19 DIAGNOSIS — R0609 Other forms of dyspnea: Secondary | ICD-10-CM

## 2022-01-19 DIAGNOSIS — Z01818 Encounter for other preprocedural examination: Secondary | ICD-10-CM

## 2022-01-19 DIAGNOSIS — N183 Chronic kidney disease, stage 3 unspecified: Secondary | ICD-10-CM

## 2022-01-19 DIAGNOSIS — Z950 Presence of cardiac pacemaker: Secondary | ICD-10-CM | POA: Diagnosis not present

## 2022-01-19 DIAGNOSIS — E119 Type 2 diabetes mellitus without complications: Secondary | ICD-10-CM

## 2022-01-19 NOTE — Patient Instructions (Signed)
Medication Instructions:  Your physician recommends that you continue on your current medications as directed. Please refer to the Current Medication list given to you today.  HOLD: Please hold your aspirin as of today until you have your procedure done. After the procedure is complete we will let the surgeon decide when it is okay for you to resume your aspirin.  *If you need a refill on your cardiac medications before your next appointment, please call your pharmacy*   Lab Work: NONE If you have labs (blood work) drawn today and your tests are completely normal, you will receive your results only by: [place] (if you have MyChart) OR A paper copy in the mail If you have any lab test that is abnormal or we need to change your treatment, we will call you to review the results.   Testing/Procedures: Your physician has requested that you have an echocardiogram. Echocardiography is a painless test that uses sound waves to create images of your heart. It provides your doctor with information about the size and shape of your heart and how well your heart's chambers and valves are working. This procedure takes approximately one hour. There are no restrictions for this procedure.    Follow-Up: At [facility], you and your health needs are our priority.  As part of our continuing mission to provide you with exceptional heart care, we have created designated Provider Care Teams.  These Care Teams include your primary Cardiologist (physician) and Advanced Practice Providers (APPs -  Physician Assistants and Nurse Practitioners) who all work together to provide you with the care you need, when you need it.  We recommend signing up for the patient portal called "MyChart".  Sign up information is provided on this After Visit Summary.  MyChart is used to connect with patients for Virtual Visits (Telemedicine).  Patients are able to view lab/test results, encounter notes, upcoming appointments, etc.   Non-urgent messages can be sent to your provider as well.   To learn more about what you can do with MyChart, go to [email or web address].    Your next appointment:   Keep Upcoming appointment.    Other Instructions Please send a remote transmission from your device when you get home.   Important Information About Sugar

## 2022-01-19 NOTE — Progress Notes (Addendum)
Cardiology Office Note:    Date:  01/19/2022   ID:  Michael Morton, DOB 11-04-1940, MRN [id number]  PCP:  Cathlean Sauer, [place] [facility] Cardiologist:  Kirk Ruths, MD Electrophysiologist:  Virl Axe, MD     Referring MD: Cathlean Sauer, MD   Chief Complaint  Patient presents with   Pre-op Exam    Seen for Dr. Stanford Breed    History of Present Illness:    Michael Morton is a 81 y.o. male with a hx of CAD, SSS s/p PPM, hypertension, hyperlipidemia, DM2 and CKD stage III followed by Dr Neta Ehlers.  Patient was previously followed in [facility]. Pacemaker was placed in February 2020 due to sick sinus syndrome.  Echocardiogram in February 2020 showed EF 65 to 70%, mild LVH.  Cardiac catheterization performed in April 2020 revealed occluded LAD and RCA, moderate stenosis in the left circumflex vessel, patent stents in the OM1 and OM 2.  Patent LIMA to LAD, SVG to PDA.  SVG to OM1 and OM 2 were occluded, EF 60%.  Medical therapy was recommended.  Beta-blocker was previously discontinued due to due to fatigue.  Abdominal ultrasound in April 2021 showed no aneurysm.  Patient was last seen by Dr. Stanford Breed on 02/18/2021 at which time she was doing well.  Most recent device interrogation performed on 12/18/2021 showed normal device function.  Patient presents today for follow-up and preop clearance prior to upcoming eyelid surgery in 6 days.  He denies any recent chest pain.  He may start holding aspirin prior to the procedure.  Family does note that he has been having progressive dyspnea on exertion over the past several years, however patient attributed the increased exertional dyspnea due to right knee pain.  He does not do a whole lot of activity.  I spoke with DOD, we will obtain a echocardiogram, however echocardiogram does not need to be done prior to the surgery.  He is cleared from the medical perspective to proceed with upcoming eyelid surgery.  EKG however does show pacing spikes  in multiple locations concerning for under sensing and over pacing.  Dr. Phineas Inches recommended remote transmission once the patient get home today, I spoke with our device clinic who will review the remote transmission with Dr. Caryl Comes this afternoon to make sure device is still functioning properly prior to the surgery.  Past Medical History:  Diagnosis Date   Coronary artery disease    Diabetes mellitus without complication ([place])    Hypercholesteremia    Hypertension    Pneumonia     Past Surgical History:  Procedure Laterality Date   APPENDECTOMY     BACK SURGERY     CARDIAC SURGERY     CORONARY ARTERY BYPASS GRAFT     KNEE SURGERY     POSTERIOR CERVICAL LAMINECTOMY FOR EPIDURAL ABSCESS N/A 11/03/2013   Procedure: POSTERIOR CERVICAL AND THORACIC EVACUATION OF EPIDURAL ABSCESS, CERVICAL SIX AND THORACIC FOUR LAMINECTOMIES;  Surgeon: Winfield Cunas, MD;  Location: [place] [facility];  Service: Neurosurgery;  Laterality: N/A;   ROTATOR CUFF REPAIR     TEE WITHOUT CARDIOVERSION N/A 11/12/2013   Procedure: TRANSESOPHAGEAL ECHOCARDIOGRAM (TEE);  Surgeon: Dorothy Spark, MD;  Location: [facility];  Service: Cardiovascular;  Laterality: N/A;    Current Medications: Current Meds  Medication Sig   acetaminophen (TYLENOL) 500 MG tablet Take 500 mg by mouth 2 (two) times daily.    acyclovir (ZOVIRAX) 400 MG tablet Take 400 mg by mouth daily.  AgaMatrix Ultra-Thin Lancets MISC Check BS twice daily Dx. 250.60, 250.70   amLODipine (NORVASC) 5 MG tablet Take 1 tablet (5 mg total) by mouth daily.   aspirin 81 MG tablet Take 81 mg by mouth daily.    atorvastatin (LIPITOR) 40 MG tablet TAKE 1 TABLET BY MOUTH ONCE DAILY   ezetimibe (ZETIA) 10 MG tablet Take 1 tablet (10 mg total) by mouth daily.   fenofibrate (TRICOR) 145 MG tablet Take 1 tablet by mouth daily.   furosemide (LASIX) 20 MG tablet Take 20 mg by mouth daily.    glipiZIDE (GLUCOTROL XL) 10 MG 24 hr tablet TAKE 1 TABLET BY MOUTH ONCE  DAILY   glucose blood (PRECISION QID TEST) test strip daily in the afternoon.   isosorbide dinitrate (ISORDIL) 30 MG tablet Take 2 tablets (60 mg total) by mouth daily.   losartan (COZAAR) 25 MG tablet Take 1 tablet (25 mg total) by mouth daily. (Patient taking differently: Take 100 mg by mouth daily.)   metoprolol succinate (TOPROL-XL) 50 MG 24 hr tablet Take 50 mg by mouth in the morning and at bedtime.   omeprazole (PRILOSEC) 40 MG capsule Take 1 capsule by mouth daily.   sertraline (ZOLOFT) 50 MG tablet Take 50 mg by mouth daily.   sitaGLIPtin (JANUVIA) 50 MG tablet TAKE 1 TABLET BY MOUTH ONCE DAILY   tolterodine (DETROL LA) 4 MG 24 hr capsule Take 1 capsule by mouth daily.   traZODone (DESYREL) 100 MG tablet Take 100 mg by mouth at bedtime.     Allergies:   Ace inhibitors and Oxycodone   Social History   Socioeconomic History   Marital status: Married    Spouse name: Not on file   Number of children: Not on file   Years of education: Not on file   Highest education level: Not on file  Occupational History   Not on file  Tobacco Use   Smoking status: Former   Smokeless tobacco: Never  Substance and Sexual Activity   Alcohol use: Yes    Comment: rare   Drug use: No   Sexual activity: Not on file  Other Topics Concern   Not on file  Social History Narrative   Not on file   Social Determinants of Health   Financial Resource Strain: Not on file  Food Insecurity: Not on file  Transportation Needs: Not on file  Physical Activity: Not on file  Stress: Not on file  Social Connections: Not on file     Family History: The patient's family history is not on file.  ROS:   Please see the history of present illness.     All other systems reviewed and are negative.  EKGs/Labs/Other Studies Reviewed:    The following studies were reviewed today:  Echo 11/12/2013 LV EF: 55% -   60%   -------------------------------------------------------------------  Study Conclusions    - Left ventricle: The cavity size was normal. Wall thickness was    normal. Systolic function was normal. The estimated ejection    fraction was in the range of 55% to 60%.  - Aortic valve: No evidence of vegetation. There was trivial    regurgitation.  - Left atrium: No evidence of thrombus in the atrial cavity or    appendage. No evidence of thrombus in the appendage.  - Right atrium: No evidence of thrombus in the atrial cavity or    appendage.  - Atrial septum: No defect or patent foramen ovale was identified.    Echo  contrast study showed no right-to-left atrial level shunt,    following an increase in RA pressure induced by provocative    maneuvers.  - Pulmonic valve: No evidence of vegetation.   Impressions:   - No evidence of endocarditis. There was no evidence of a    vegetation.   EKG:  EKG is ordered today.  The ekg ordered today demonstrates paced rhythm with multiple pacing spikes concerning for under sensing and over pacing  Recent Labs: 02/18/2021: BUN 29; Creatinine, Ser 2.12; Potassium 5.3; Sodium 140  Recent Lipid Panel    Component Value Date/Time   CHOL 107 02/08/2020 0807   TRIG 173 (H) 02/08/2020 0807   HDL 39 (L) 02/08/2020 0807   CHOLHDL 2.7 02/08/2020 0807   LDLCALC 39 02/08/2020 0807     Risk Assessment/Calculations:           Physical Exam:    VS:  BP 134/66 (BP Location: Left Arm, Patient Position: Sitting, Cuff Size: Normal)   Pulse 60   Ht '6\' 2"'$  (1.88 m)   Wt 194 lb 3.2 oz (88.1 kg)   SpO2 90%   BMI 24.93 kg/m         Wt Readings from Last 3 Encounters:  01/19/22 194 lb 3.2 oz (88.1 kg)  07/09/21 191 lb (86.6 kg)  05/23/21 192 lb (87.1 kg)     GEN:  Well nourished, well developed in no acute distress HEENT: Normal NECK: No JVD; No carotid bruits LYMPHATICS: No lymphadenopathy CARDIAC: RRR, no murmurs, rubs, gallops RESPIRATORY:  Clear to auscultation without rales, wheezing or rhonchi  ABDOMEN: Soft, non-tender,  non-distended MUSCULOSKELETAL:  No edema; No deformity  SKIN: Warm and dry NEUROLOGIC:  Alert and oriented x 3 PSYCHIATRIC:  Normal affect   ASSESSMENT:    1. Pre-operative clearance   2. DOE (dyspnea on exertion)   3. Pacemaker   4. Primary hypertension   5. Hyperlipidemia LDL goal <70   6. Controlled type 2 diabetes mellitus without complication, without long-term current use of insulin (HCC)   7. Stage 3 chronic kidney disease, unspecified whether stage 3a or 3b CKD ([place])    PLAN:    In order of problems listed above:  Preop clearance: Patient has upcoming eyelid surgery, this is a very low risk procedure.  Patient may proceed from the cardiac perspective without further work-up.  He can start holding aspirin now and restart as soon as possible afterward at the surgeon's discretion  Dyspnea on exertion: Repeat echocardiogram, however this can be done after his surgery.  History of SSS s/p pacemaker: Followed by Dr. Caryl Comes, patient has not seen Dr. Caryl Comes for a long time.  Most recent device interrogation in July showed normal device function.  EKG today however showed multiple pacing spikes concerning for under sensing and over pacing.  Discussed with Dr. Phineas Inches, instructed the patient to send me a remote transmission this afternoon and have Dr. Caryl Comes review it to make sure patient can proceed with surgery  -Addendum: I asked the patient to send remote transmission.  I spoke personally with our device clinic nurse, his pacemaker seems to be functioning normally based on the transmission received on 8/15.  Hypertension: Blood pressure stable  Hyperlipidemia: On Lipitor  DM2: Blood pressure stable  CKD stage III: Followed by nephrology service.               Medication Adjustments/Labs and Tests Ordered: Current medicines are reviewed at length with the patient today.  Concerns  regarding medicines are outlined above.  Orders Placed This Encounter  Procedures   EKG  12-Lead   ECHOCARDIOGRAM COMPLETE   No orders of the defined types were placed in this encounter.   Patient Instructions  Medication Instructions:  Your physician recommends that you continue on your current medications as directed. Please refer to the Current Medication list given to you today.  HOLD: Please hold your aspirin as of today until you have your procedure done. After the procedure is complete we will let the surgeon decide when it is okay for you to resume your aspirin.  *If you need a refill on your cardiac medications before your next appointment, please call your pharmacy*   Lab Work: NONE If you have labs (blood work) drawn today and your tests are completely normal, you will receive your results only by: [place] (if you have MyChart) OR A paper copy in the mail If you have any lab test that is abnormal or we need to change your treatment, we will call you to review the results.   Testing/Procedures: Your physician has requested that you have an echocardiogram. Echocardiography is a painless test that uses sound waves to create images of your heart. It provides your doctor with information about the size and shape of your heart and how well your heart's chambers and valves are working. This procedure takes approximately one hour. There are no restrictions for this procedure.    Follow-Up: At [facility], you and your health needs are our priority.  As part of our continuing mission to provide you with exceptional heart care, we have created designated Provider Care Teams.  These Care Teams include your primary Cardiologist (physician) and Advanced Practice Providers (APPs -  Physician Assistants and Nurse Practitioners) who all work together to provide you with the care you need, when you need it.  We recommend signing up for the patient portal called "MyChart".  Sign up information is provided on this After Visit Summary.  MyChart is used to connect with  patients for Virtual Visits (Telemedicine).  Patients are able to view lab/test results, encounter notes, upcoming appointments, etc.  Non-urgent messages can be sent to your provider as well.   To learn more about what you can do with MyChart, go to [email or web address].    Your next appointment:   Keep Upcoming appointment.    Other Instructions Please send a remote transmission from your device when you get home.   Important Information About Sugar         Hilbert Corrigan, [place]  01/19/2022 1:33 PM    [place]

## 2022-01-22 NOTE — Telephone Encounter (Signed)
   Patient Name: Michael Morton  DOB: 02/08/1941 MRN: [id number]  Primary Cardiologist: Kirk Ruths, MD  Chart reviewed as part of pre-operative protocol coverage. Given past medical history and time since last visit, based on ACC/AHA guidelines, Michael Morton would be at acceptable risk for the planned procedure without further cardiovascular testing.   I asked him to hold start holding his aspirin on 01/17/2022.  Although EKG showed possible under sensing and over pacing, however subsequent device interrogation did not show any abnormality with his pacemaker.  We did order echocardiogram due to dyspnea on exertion, however this can be done after his surgery.  He is at acceptable risk to proceed with upcoming eyelid surgery.  The patient was advised that if he develops new symptoms prior to surgery to contact our office to arrange for a follow-up visit, and he verbalized understanding.  I will route this recommendation to the requesting party via Epic fax function and remove from pre-op pool.  Please call with questions.  [place], [place] 01/22/2022, 1:37 PM

## 2022-01-23 NOTE — Progress Notes (Unsigned)
Cardiology Office Note Date:  01/23/2022  Patient ID:  Michael Morton, DOB [birth year], MRN [id number] PCP:  Cathlean Sauer, MD  Cardiologist:  Dr. Stanford Breed Electrophysiologist: Dr. Caryl Comes  ***refresh   Chief Complaint: *** annual EP visit  History of Present Illness: Michael Morton is a 81 y.o. male with history of CAD (CABG > PCI), CKD (III), DM, SSSx w/PPM, HTN, HLD, RBBB  He last saw Dr. Caryl Comes back in July 2020, had initially issues it seems with rate response and tachy rates w/CP, unclear sudden rate hikes and drops, BB was stopped and programming perhaps previously had helped, this is unclear.  Has followed with Dr. Lauris Poag most recently just saw H. Eulas Post, PA-C 01/19/22 for pre-op evaluation prior to eye lid surgery. Knee pain limited him, had progressive DOE felt 2/2 pain by the pt/family, not particularly active Felt reasonable to pursue eye lid surgery, planned for echo (not pre-op). EKG with noted of inappropriate pacer spikes.  Recommended remote transmission once home and EP follow up pre-op as well.  *** last remote July AP/VS *** EKG ?? Artifact vs real , V pacing, unusual pacing behavior if real *** symptoms   Device information MDT dual chamber PPM implanted 07/10/2018   Past Medical History:  Diagnosis Date   Coronary artery disease    Diabetes mellitus without complication ([place])    Hypercholesteremia    Hypertension    Pneumonia     Past Surgical History:  Procedure Laterality Date   APPENDECTOMY     BACK SURGERY     CARDIAC SURGERY     CORONARY ARTERY BYPASS GRAFT     KNEE SURGERY     POSTERIOR CERVICAL LAMINECTOMY FOR EPIDURAL ABSCESS N/A 11/03/2013   Procedure: POSTERIOR CERVICAL AND THORACIC EVACUATION OF EPIDURAL ABSCESS, CERVICAL SIX AND THORACIC FOUR LAMINECTOMIES;  Surgeon: Winfield Cunas, MD;  Location: [place] [facility];  Service: Neurosurgery;  Laterality: N/A;   ROTATOR CUFF REPAIR     TEE WITHOUT CARDIOVERSION N/A 11/12/2013   Procedure:  TRANSESOPHAGEAL ECHOCARDIOGRAM (TEE);  Surgeon: Dorothy Spark, MD;  Location: [facility] ENDOSCOPY;  Service: Cardiovascular;  Laterality: N/A;    Current Outpatient Medications  Medication Sig Dispense Refill   acetaminophen (TYLENOL) 500 MG tablet Take 500 mg by mouth 2 (two) times daily.      acyclovir (ZOVIRAX) 400 MG tablet Take 400 mg by mouth daily.      AgaMatrix Ultra-Thin Lancets MISC Check BS twice daily Dx. 250.60, 250.70     amLODipine (NORVASC) 5 MG tablet Take 1 tablet (5 mg total) by mouth daily. 90 tablet 3   aspirin 81 MG tablet Take 81 mg by mouth daily.      atorvastatin (LIPITOR) 40 MG tablet TAKE 1 TABLET BY MOUTH ONCE DAILY     ezetimibe (ZETIA) 10 MG tablet Take 1 tablet (10 mg total) by mouth daily. 90 tablet 3   fenofibrate (TRICOR) 145 MG tablet Take 1 tablet by mouth daily.     furosemide (LASIX) 20 MG tablet Take 20 mg by mouth daily.      gabapentin (NEURONTIN) 300 MG capsule Take 1 capsule (300 mg total) by mouth 2 (two) times daily for 1 day, THEN 1 capsule (300 mg total) 3 (three) times daily. (Patient not taking: Reported on 01/19/2022) 90 capsule 0   glipiZIDE (GLUCOTROL XL) 10 MG 24 hr tablet TAKE 1 TABLET BY MOUTH ONCE DAILY     glucose blood (PRECISION QID TEST) test strip daily in the afternoon.  isosorbide dinitrate (ISORDIL) 30 MG tablet Take 2 tablets (60 mg total) by mouth daily. 180 tablet 3   losartan (COZAAR) 25 MG tablet Take 1 tablet (25 mg total) by mouth daily. (Patient taking differently: Take 100 mg by mouth daily.) 90 tablet 3   metoprolol succinate (TOPROL-XL) 50 MG 24 hr tablet Take 50 mg by mouth in the morning and at bedtime.     nitroGLYCERIN (NITROSTAT) 0.4 MG SL tablet Place 0.4 mg under the tongue every 5 (five) minutes as needed for chest pain. (Patient not taking: Reported on 01/19/2022)     omeprazole (PRILOSEC) 40 MG capsule Take 1 capsule by mouth daily.     Potassium Citrate 15 MEQ (1620 MG) TBCR TAKE 1 TABLET BY MOUTH TWICE DAILY  (Patient not taking: Reported on 01/19/2022)     sertraline (ZOLOFT) 50 MG tablet Take 50 mg by mouth daily.     sitaGLIPtin (JANUVIA) 50 MG tablet TAKE 1 TABLET BY MOUTH ONCE DAILY     tolterodine (DETROL LA) 4 MG 24 hr capsule Take 1 capsule by mouth daily.     traZODone (DESYREL) 100 MG tablet Take 100 mg by mouth at bedtime.     traZODone (DESYREL) 50 MG tablet Take 100 mg by mouth at bedtime.      No current facility-administered medications for this visit.    Allergies:   Ace inhibitors and Oxycodone   Social History:  The patient  reports that he has quit smoking. He has never used smokeless tobacco. He reports current alcohol use. He reports that he does not use drugs.   Family History:  The patient's family history is not on file.  ROS:  Please see the history of present illness.    All other systems are reviewed and otherwise negative.   PHYSICAL EXAM:  VS:  There were no vitals taken for this visit. BMI: There is no height or weight on file to calculate BMI. Well nourished, well developed, in no acute distress HEENT: normocephalic, atraumatic Neck: no JVD, carotid bruits or masses Cardiac:  *** RRR; no significant murmurs, no rubs, or gallops Lungs:  *** CTA b/l, no wheezing, rhonchi or rales Abd: soft, nontender MS: no deformity or *** atrophy Ext: *** no edema Skin: warm and dry, no rash Neuro:  No gross deficits appreciated Psych: euthymic mood, full affect  *** PPM site is stable, no tethering or discomfort   EKG:  Done today and reviewed by myself shows  ***  Device interrogation done today and reviewed by myself:  ***  11/12/2013; TEE Study Conclusions  - Left ventricle: The cavity size was normal. Wall thickness was    normal. Systolic function was normal. The estimated ejection    fraction was in the range of 55% to 60%.  - Aortic valve: No evidence of vegetation. There was trivial    regurgitation.  - Left atrium: No evidence of thrombus in the atrial  cavity or    appendage. No evidence of thrombus in the appendage.  - Right atrium: No evidence of thrombus in the atrial cavity or    appendage.  - Atrial septum: No defect or patent foramen ovale was identified.    Echo contrast study showed no right-to-left atrial level shunt,    following an increase in RA pressure induced by provocative    maneuvers.  - Pulmonic valve: No evidence of vegetation.   Impressions:   - No evidence of endocarditis. There was no evidence of a  vegetation.   Recent Labs: 02/18/2021: BUN 29; Creatinine, Ser 2.12; Potassium 5.3; Sodium 140  No results found for requested labs within last 365 days.   CrCl cannot be calculated (Patient's most recent lab result is older than the maximum 21 days allowed.).   Wt Readings from Last 3 Encounters:  01/19/22 194 lb 3.2 oz (88.1 kg)  07/09/21 191 lb (86.6 kg)  05/23/21 192 lb (87.1 kg)     Other studies reviewed: Additional studies/records reviewed today include: summarized above  ASSESSMENT AND PLAN:  PPM *** ***   Disposition: F/u with ***  Current medicines are reviewed at length with the patient today.  The patient did not have any concerns regarding medicines.  Venetia Night, PA-C 01/23/2022 6:27 PM     [address] [place] [address] [phone] (office)  [phone] (fax)

## 2022-01-26 ENCOUNTER — Encounter: Payer: Self-pay | Admitting: Physician Assistant

## 2022-01-26 ENCOUNTER — Ambulatory Visit (INDEPENDENT_AMBULATORY_CARE_PROVIDER_SITE_OTHER): Payer: Medicare Other | Admitting: Physician Assistant

## 2022-01-26 VITALS — BP 130/52 | HR 60 | Ht 74.0 in | Wt 194.8 lb

## 2022-01-26 DIAGNOSIS — Z950 Presence of cardiac pacemaker: Secondary | ICD-10-CM

## 2022-01-26 LAB — CUP PACEART INCLINIC DEVICE CHECK
Battery Remaining Longevity: 126 mo
Battery Voltage: 3 V
Brady Statistic AP VP Percent: 59.75 %
Brady Statistic AP VS Percent: 39.32 %
Brady Statistic AS VP Percent: 0.54 %
Brady Statistic AS VS Percent: 0.4 %
Brady Statistic RA Percent Paced: 99.06 %
Brady Statistic RV Percent Paced: 60.28 %
Date Time Interrogation Session: 20230822192652
Implantable Lead Implant Date: 20200203
Implantable Lead Implant Date: 20200203
Implantable Lead Location: 753859
Implantable Lead Location: 753860
Implantable Lead Model: 5076
Implantable Lead Model: 5076
Implantable Pulse Generator Implant Date: 20200203
Lead Channel Impedance Value: 418 Ohm
Lead Channel Impedance Value: 418 Ohm
Lead Channel Impedance Value: 532 Ohm
Lead Channel Impedance Value: 551 Ohm
Lead Channel Pacing Threshold Amplitude: 0.625 V
Lead Channel Pacing Threshold Amplitude: 0.875 V
Lead Channel Pacing Threshold Pulse Width: 0.4 ms
Lead Channel Pacing Threshold Pulse Width: 0.4 ms
Lead Channel Sensing Intrinsic Amplitude: 4.25 mV
Lead Channel Sensing Intrinsic Amplitude: 4.25 mV
Lead Channel Sensing Intrinsic Amplitude: 6.25 mV
Lead Channel Sensing Intrinsic Amplitude: 6.25 mV
Lead Channel Setting Pacing Amplitude: 2 V
Lead Channel Setting Pacing Amplitude: 2 V
Lead Channel Setting Pacing Pulse Width: 0.4 ms
Lead Channel Setting Sensing Sensitivity: 0.9 mV

## 2022-01-26 NOTE — Patient Instructions (Signed)

## 2022-01-27 NOTE — Addendum Note (Signed)
Addended by: Janan Halter F on: 01/27/2022 12:18 PM   Modules accepted: Orders

## 2022-02-15 ENCOUNTER — Ambulatory Visit (HOSPITAL_COMMUNITY): Payer: Medicare Other | Attending: Physician Assistant

## 2022-02-15 DIAGNOSIS — R0609 Other forms of dyspnea: Secondary | ICD-10-CM | POA: Diagnosis present

## 2022-02-15 LAB — ECHOCARDIOGRAM COMPLETE
Area-P 1/2: 3.21 cm2
S' Lateral: 2.4 cm

## 2022-03-26 ENCOUNTER — Other Ambulatory Visit: Payer: Self-pay | Admitting: Cardiology

## 2022-03-26 DIAGNOSIS — E782 Mixed hyperlipidemia: Secondary | ICD-10-CM

## 2022-05-10 NOTE — Progress Notes (Deleted)
HPI: Follow-up coronary artery disease.  Previously followed in [facility]. Most recent cardiac catheterization April 2020 showed occluded LAD and right coronary artery; moderate stenosis of the circumflex. Patent stents in the first and second marginal. Patent LIMA to the LAD and saphenous vein graft to PDA. Saphenous vein graft to the first and second marginal occluded; ejection fraction 60%. Medical therapy recommended. Patient has had previous pacemaker for sick sinus syndrome (2/20).  Beta-blocker discontinued at previous office visit due to fatigue.  Abdominal ultrasound April 2021 showed no aneurysm.  Echocardiogram September 2023 showed normal LV function, mild left ventricular hypertrophy, grade 1 diastolic dysfunction.  Since last seen,   Current Outpatient Medications  Medication Sig Dispense Refill   acetaminophen (TYLENOL) 500 MG tablet Take 500 mg by mouth 2 (two) times daily.      acyclovir (ZOVIRAX) 400 MG tablet Take 400 mg by mouth daily.      AgaMatrix Ultra-Thin Lancets MISC Check BS twice daily Dx. 250.60, 250.70     amLODipine (NORVASC) 5 MG tablet Take 1 tablet (5 mg total) by mouth daily. 90 tablet 3   aspirin 81 MG tablet Take 81 mg by mouth daily.      atorvastatin (LIPITOR) 40 MG tablet TAKE 1 TABLET BY MOUTH ONCE DAILY     ezetimibe (ZETIA) 10 MG tablet TAKE 1 TABLET BY MOUTH DAILY 90 tablet 3   fenofibrate (TRICOR) 145 MG tablet Take 1 tablet by mouth daily.     furosemide (LASIX) 20 MG tablet Take 20 mg by mouth daily.      gabapentin (NEURONTIN) 300 MG capsule Take 300 mg by mouth as needed (for neuropathy).     glipiZIDE (GLUCOTROL XL) 10 MG 24 hr tablet TAKE 1 TABLET BY MOUTH ONCE DAILY     glucose blood (PRECISION QID TEST) test strip daily in the afternoon.     isosorbide dinitrate (ISORDIL) 30 MG tablet Take 2 tablets (60 mg total) by mouth daily. 180 tablet 3   losartan (COZAAR) 25 MG tablet Take 1 tablet (25 mg total) by mouth daily. 90 tablet 3    metoprolol succinate (TOPROL-XL) 50 MG 24 hr tablet Take 50 mg by mouth in the morning and at bedtime.     nitroGLYCERIN (NITROSTAT) 0.4 MG SL tablet Place 0.4 mg under the tongue every 5 (five) minutes as needed for chest pain.     omeprazole (PRILOSEC) 40 MG capsule Take 1 capsule by mouth daily.     Potassium Citrate 15 MEQ (1620 MG) TBCR      sertraline (ZOLOFT) 50 MG tablet Take 50 mg by mouth daily.     sitaGLIPtin (JANUVIA) 50 MG tablet TAKE 1 TABLET BY MOUTH ONCE DAILY     tolterodine (DETROL LA) 4 MG 24 hr capsule Take 1 capsule by mouth daily.     traZODone (DESYREL) 100 MG tablet Take 100 mg by mouth at bedtime.     No current facility-administered medications for this visit.     Past Medical History:  Diagnosis Date   Coronary artery disease    Diabetes mellitus without complication ([place])    Hypercholesteremia    Hypertension    Pneumonia     Past Surgical History:  Procedure Laterality Date   APPENDECTOMY     BACK SURGERY     CARDIAC SURGERY     CORONARY ARTERY BYPASS GRAFT     KNEE SURGERY     POSTERIOR CERVICAL LAMINECTOMY FOR EPIDURAL ABSCESS N/A 11/03/2013  Procedure: POSTERIOR CERVICAL AND THORACIC EVACUATION OF EPIDURAL ABSCESS, CERVICAL SIX AND THORACIC FOUR LAMINECTOMIES;  Surgeon: Winfield Cunas, MD;  Location: [place] [facility]UR[facility] ORS;  Service: Neurosurgery;  Laterality: N/A;   ROTATOR CUFF REPAIR     TEE WITHOUT CARDIOVERSION N/A 11/12/2013   Procedure: TRANSESOPHAGEAL ECHOCARDIOGRAM (TEE);  Surgeon: Dorothy Spark, MD;  Location: [facility] ENDOSCOPY;  Service: Cardiovascular;  Laterality: N/A;    Social History   Socioeconomic History   Marital status: Married    Spouse name: Not on file   Number of children: Not on file   Years of education: Not on file   Highest education level: Not on file  Occupational History   Not on file  Tobacco Use   Smoking status: Former   Smokeless tobacco: Never  Substance and Sexual Activity   Alcohol use: Yes    Comment: rare    Drug use: No   Sexual activity: Not on file  Other Topics Concern   Not on file  Social History Narrative   Not on file   Social Determinants of Health   Financial Resource Strain: Not on file  Food Insecurity: Not on file  Transportation Needs: Not on file  Physical Activity: Not on file  Stress: Not on file  Social Connections: Not on file  Intimate Partner Violence: Not on file    No family history on file.  ROS: no fevers or chills, productive cough, hemoptysis, dysphasia, odynophagia, melena, hematochezia, dysuria, hematuria, rash, seizure activity, orthopnea, PND, pedal edema, claudication. Remaining systems are negative.  Physical Exam: Well-developed well-nourished in no acute distress.  Skin is warm and dry.  HEENT is normal.  Neck is supple.  Chest is clear to auscultation with normal expansion.  Cardiovascular exam is regular rate and rhythm.  Abdominal exam nontender or distended. No masses palpated. Extremities show no edema. neuro grossly intact  ECG- personally reviewed  A/P  1 coronary artery disease-plan to continue medical therapy with aspirin and statin.  2 hypertension-blood pressure controlled.  Continue present medical regimen.  3 hyperlipidemia-continue statin.  4 pacemaker-Per EP.  Kirk Ruths, MD

## 2022-05-19 ENCOUNTER — Ambulatory Visit: Payer: Medicare Other | Attending: Cardiology | Admitting: Cardiology

## 2022-06-21 ENCOUNTER — Ambulatory Visit (INDEPENDENT_AMBULATORY_CARE_PROVIDER_SITE_OTHER): Payer: Medicare Other

## 2022-06-21 DIAGNOSIS — I442 Atrioventricular block, complete: Secondary | ICD-10-CM | POA: Diagnosis not present

## 2022-06-24 LAB — CUP PACEART REMOTE DEVICE CHECK
Battery Remaining Longevity: 117 mo
Battery Voltage: 2.99 V
Brady Statistic AP VP Percent: 86.16 %
Brady Statistic AP VS Percent: 13.66 %
Brady Statistic AS VP Percent: 0.09 %
Brady Statistic AS VS Percent: 0.09 %
Brady Statistic RA Percent Paced: 99.84 %
Brady Statistic RV Percent Paced: 86.24 %
Date Time Interrogation Session: 20240115054739
Implantable Lead Connection Status: 753985
Implantable Lead Connection Status: 753985
Implantable Lead Implant Date: 20200203
Implantable Lead Implant Date: 20200203
Implantable Lead Location: 753859
Implantable Lead Location: 753860
Implantable Lead Model: 5076
Implantable Lead Model: 5076
Implantable Pulse Generator Implant Date: 20200203
Lead Channel Impedance Value: 399 Ohm
Lead Channel Impedance Value: 437 Ohm
Lead Channel Impedance Value: 494 Ohm
Lead Channel Impedance Value: 513 Ohm
Lead Channel Pacing Threshold Amplitude: 0.625 V
Lead Channel Pacing Threshold Amplitude: 0.875 V
Lead Channel Pacing Threshold Pulse Width: 0.4 ms
Lead Channel Pacing Threshold Pulse Width: 0.4 ms
Lead Channel Sensing Intrinsic Amplitude: 4.125 mV
Lead Channel Sensing Intrinsic Amplitude: 4.125 mV
Lead Channel Sensing Intrinsic Amplitude: 6.625 mV
Lead Channel Sensing Intrinsic Amplitude: 6.625 mV
Lead Channel Setting Pacing Amplitude: 2 V
Lead Channel Setting Pacing Amplitude: 2 V
Lead Channel Setting Pacing Pulse Width: 0.4 ms
Lead Channel Setting Sensing Sensitivity: 0.9 mV
Zone Setting Status: 755011

## 2022-07-11 IMAGING — CT CT HEAD W/O CM
3 series · 16 of 47 positions shown, 19 images · non-contrast
Comparison: None.

CLINICAL DATA: Trauma.  Fall.

EXAM:
CT HEAD WITHOUT CONTRAST
TECHNIQUE: Contiguous axial images were obtained from the base of the skull
through the vertex without intravenous contrast.

[Series 2: head wo · axial · 0.46mm/px · z∈[-195,-40]mm · 10 of 37 slices shown, 13 images]
[im 3/37  brain]
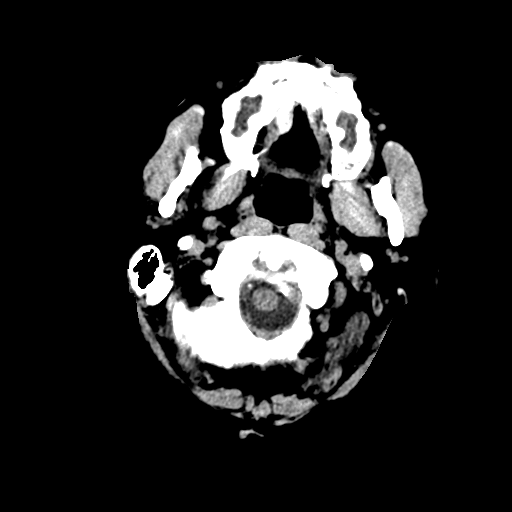
[im 3/37  bone]
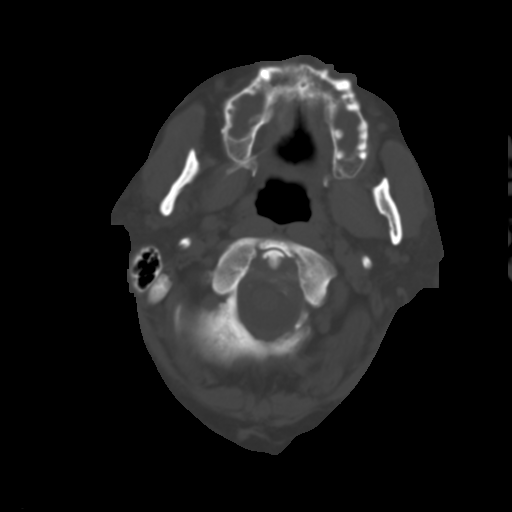
[im 7/37  brain]
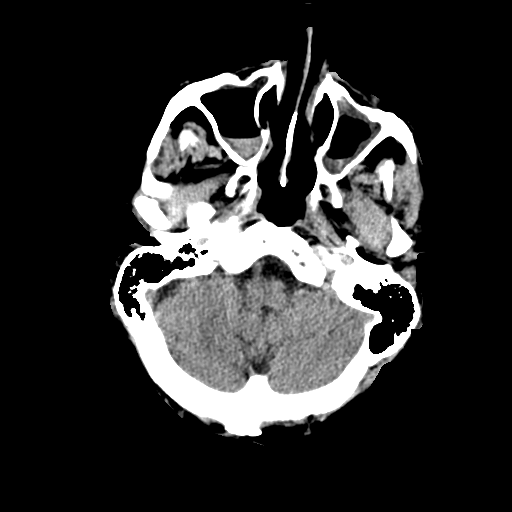
[im 10/37  brain]
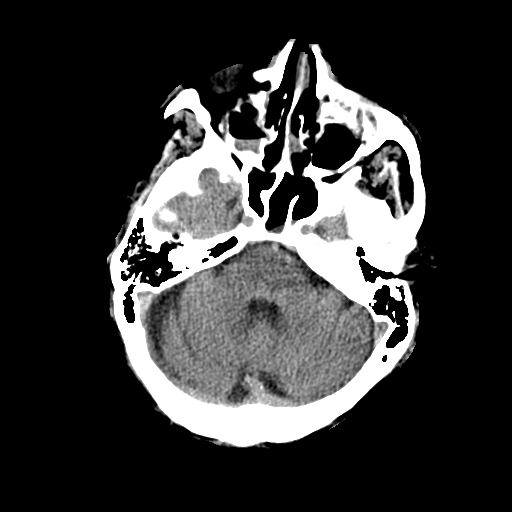
[im 13/37  brain]
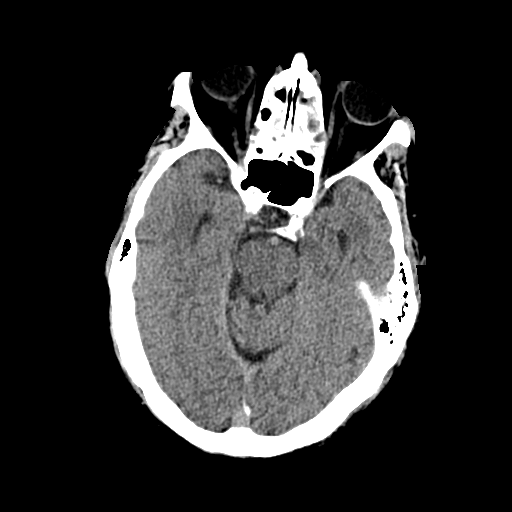
[im 17/37  brain]
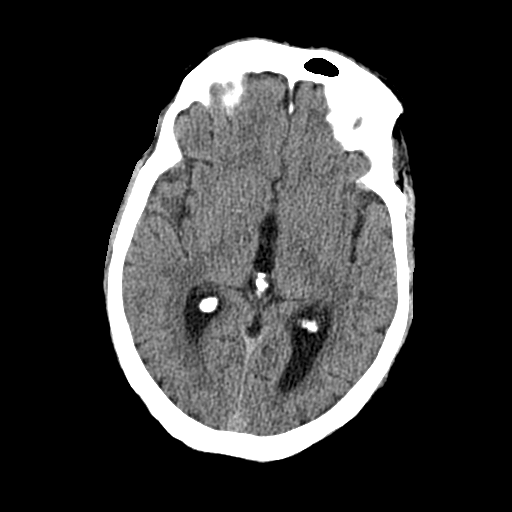
[im 17/37  bone]
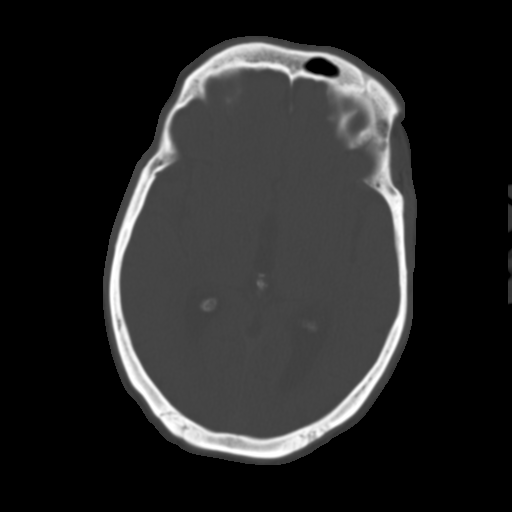
[im 20/37  brain]
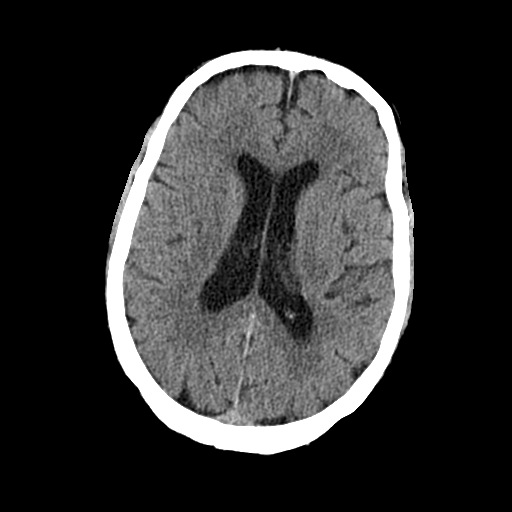
[im 24/37  brain]
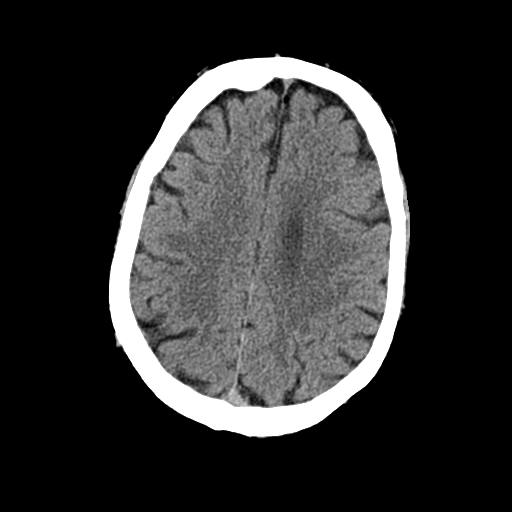
[im 28/37  brain]
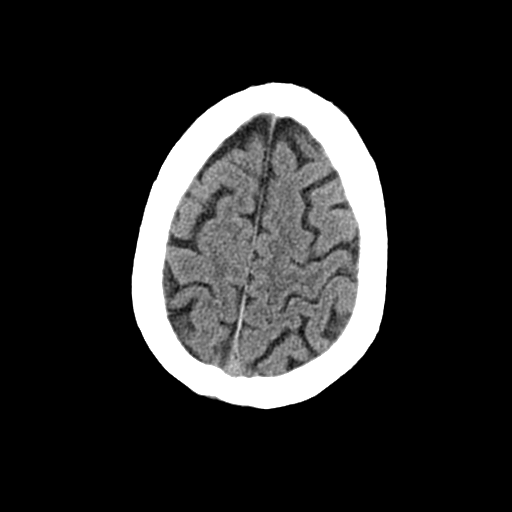
[im 30/37  brain]
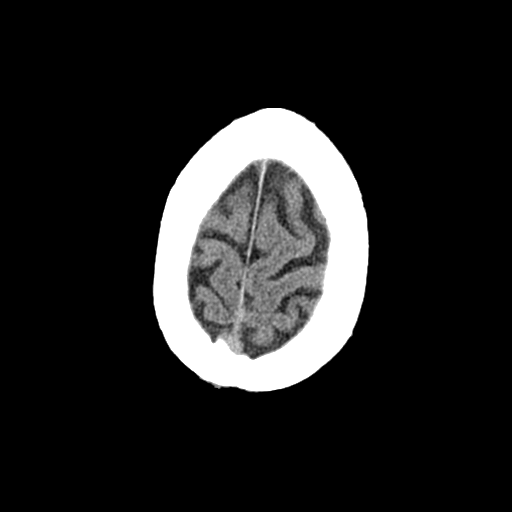
[im 30/37  bone]
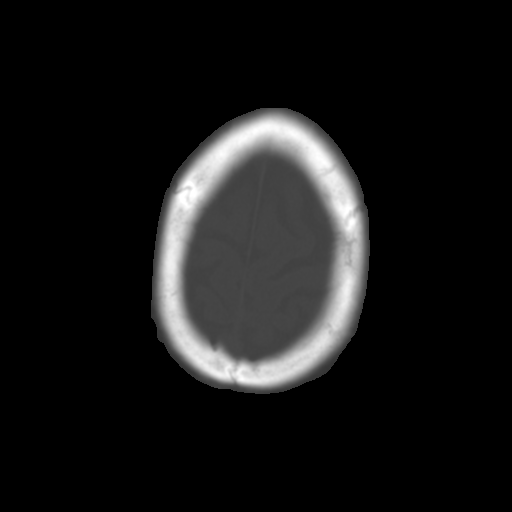
[im 34/37  brain]
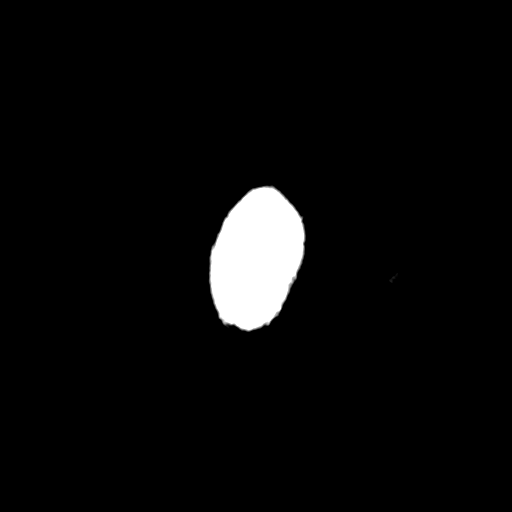

[Series 4: cor soft · coronal · 0.38mm/px · 3 of 73 slices shown]
[im 25/73  brain]
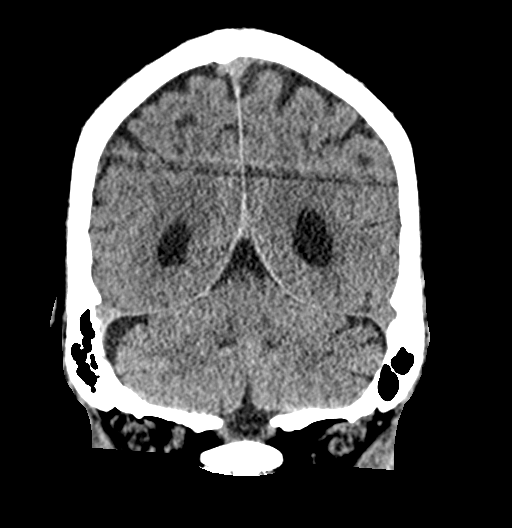
[im 33/73  brain]
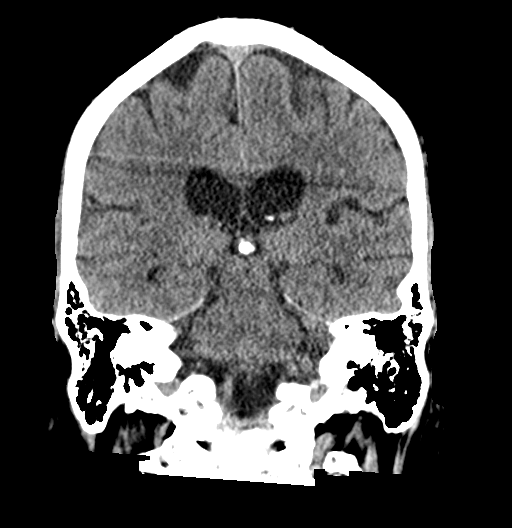
[im 41/73  brain]
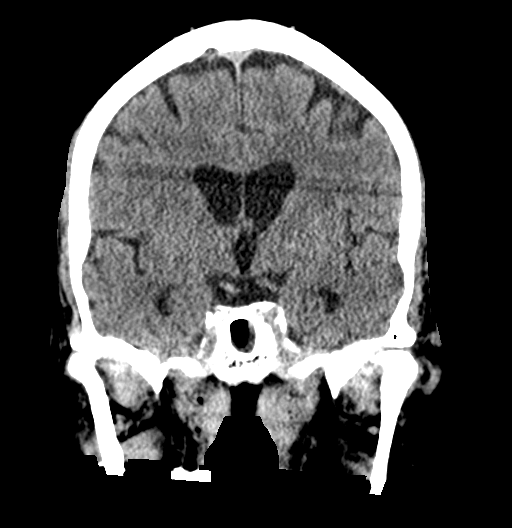

[Series 5: sag soft · sagittal · 0.42mm/px · 3 of 61 slices shown]
[im 21/61  brain]
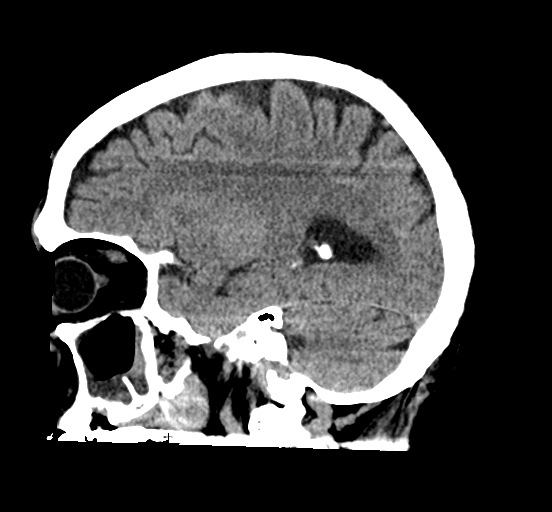
[im 31/61  brain]
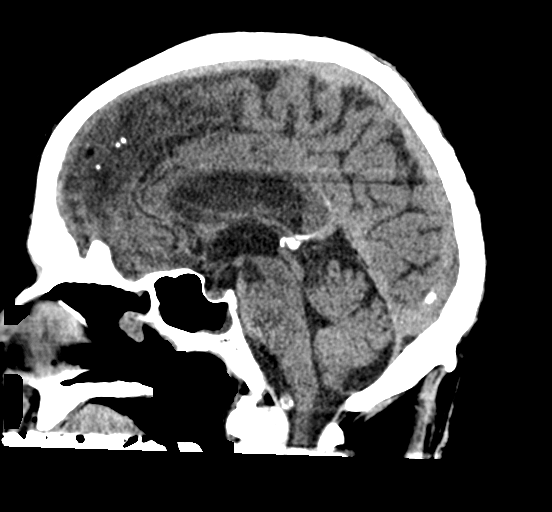
[im 41/61  brain]
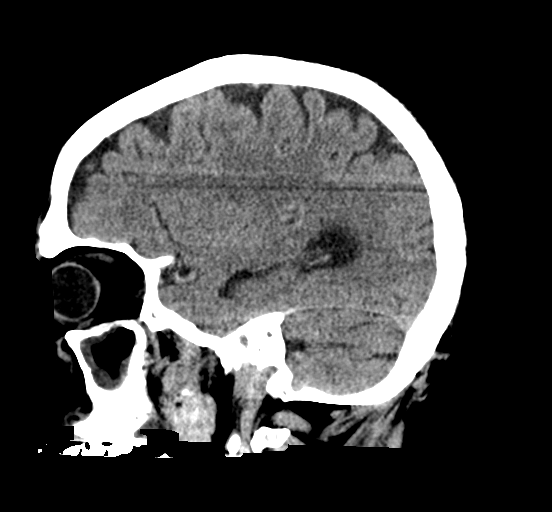

[16 of 47 positions shown; findings below may reference images not displayed]

FINDINGS: Brain: No evidence of acute infarction, hemorrhage, hydrocephalus,
extra-axial collection or mass lesion/mass effect.

Vascular: Calcified atherosclerosis in the intracranial carotids.

Skull: Normal. Negative for fracture or focal lesion.

Sinuses/Orbits: Opacification numerous ethmoid air cells. Mucosal
thickening and fluid in the maxillary sinuses. Mucosal thickening in
the sphenoid sinuses.

Other: No other abnormalities.
IMPRESSION: 1. No acute intracranial abnormalities.
2. Sinus disease as above.

## 2022-07-12 ENCOUNTER — Encounter: Payer: Self-pay | Admitting: Internal Medicine

## 2022-08-11 NOTE — Progress Notes (Signed)
Remote pacemaker transmission.   

## 2022-09-20 ENCOUNTER — Ambulatory Visit (INDEPENDENT_AMBULATORY_CARE_PROVIDER_SITE_OTHER): Payer: Medicare Other

## 2022-09-20 DIAGNOSIS — I442 Atrioventricular block, complete: Secondary | ICD-10-CM

## 2022-09-21 LAB — CUP PACEART REMOTE DEVICE CHECK
Battery Remaining Longevity: 112 mo
Battery Voltage: 2.99 V
Brady Statistic AP VP Percent: 95.07 %
Brady Statistic AP VS Percent: 4.9 %
Brady Statistic AS VP Percent: 0.01 %
Brady Statistic AS VS Percent: 0.02 %
Brady Statistic RA Percent Paced: 99.99 %
Brady Statistic RV Percent Paced: 95.08 %
Date Time Interrogation Session: 20240415053108
Implantable Lead Connection Status: 753985
Implantable Lead Connection Status: 753985
Implantable Lead Implant Date: 20200203
Implantable Lead Implant Date: 20200203
Implantable Lead Location: 753859
Implantable Lead Location: 753860
Implantable Lead Model: 5076
Implantable Lead Model: 5076
Implantable Pulse Generator Implant Date: 20200203
Lead Channel Impedance Value: 380 Ohm
Lead Channel Impedance Value: 399 Ohm
Lead Channel Impedance Value: 494 Ohm
Lead Channel Impedance Value: 494 Ohm
Lead Channel Pacing Threshold Amplitude: 0.625 V
Lead Channel Pacing Threshold Amplitude: 0.875 V
Lead Channel Pacing Threshold Pulse Width: 0.4 ms
Lead Channel Pacing Threshold Pulse Width: 0.4 ms
Lead Channel Sensing Intrinsic Amplitude: 5.25 mV
Lead Channel Sensing Intrinsic Amplitude: 5.25 mV
Lead Channel Sensing Intrinsic Amplitude: 6.375 mV
Lead Channel Sensing Intrinsic Amplitude: 6.375 mV
Lead Channel Setting Pacing Amplitude: 2 V
Lead Channel Setting Pacing Amplitude: 2 V
Lead Channel Setting Pacing Pulse Width: 0.4 ms
Lead Channel Setting Sensing Sensitivity: 0.9 mV
Zone Setting Status: 755011

## 2022-10-27 NOTE — Progress Notes (Signed)
Remote pacemaker transmission.   

## 2022-12-01 ENCOUNTER — Telehealth: Payer: Self-pay | Admitting: Physician Assistant

## 2022-12-01 NOTE — Telephone Encounter (Signed)
  Dr. Hinda Glatter office called, they are requesting for the most recent EKG faxed to them to compare pt's EKG today. She gave fax# [phone]. They would like to receive it today. She gave fax# [phone]

## 2022-12-01 NOTE — Telephone Encounter (Signed)
EKG faxed.  Will forward note to [facility] triage

## 2022-12-01 NOTE — Telephone Encounter (Signed)
Caller wants a call back directly from Dr. Jens Som to [phone] regarding patient's last EKG results.

## 2022-12-02 NOTE — Telephone Encounter (Signed)
Dr Jens Som spoke with Dr Roney Mans regarding ECG.

## 2022-12-20 ENCOUNTER — Ambulatory Visit (INDEPENDENT_AMBULATORY_CARE_PROVIDER_SITE_OTHER): Payer: Medicare Other

## 2022-12-20 DIAGNOSIS — I442 Atrioventricular block, complete: Secondary | ICD-10-CM | POA: Diagnosis not present

## 2022-12-20 LAB — CUP PACEART REMOTE DEVICE CHECK
Battery Remaining Longevity: 108 mo
Battery Voltage: 2.99 V
Brady Statistic AP VP Percent: 84.7 %
Brady Statistic AP VS Percent: 15.19 %
Brady Statistic AS VP Percent: 0.06 %
Brady Statistic AS VS Percent: 0.06 %
Brady Statistic RA Percent Paced: 99.9 %
Brady Statistic RV Percent Paced: 84.75 %
Date Time Interrogation Session: 20240715043819
Implantable Lead Connection Status: 753985
Implantable Lead Connection Status: 753985
Implantable Lead Implant Date: 20200203
Implantable Lead Implant Date: 20200203
Implantable Lead Location: 753859
Implantable Lead Location: 753860
Implantable Lead Model: 5076
Implantable Lead Model: 5076
Implantable Pulse Generator Implant Date: 20200203
Lead Channel Impedance Value: 380 Ohm
Lead Channel Impedance Value: 399 Ohm
Lead Channel Impedance Value: 456 Ohm
Lead Channel Impedance Value: 494 Ohm
Lead Channel Pacing Threshold Amplitude: 0.625 V
Lead Channel Pacing Threshold Amplitude: 0.875 V
Lead Channel Pacing Threshold Pulse Width: 0.4 ms
Lead Channel Pacing Threshold Pulse Width: 0.4 ms
Lead Channel Sensing Intrinsic Amplitude: 4 mV
Lead Channel Sensing Intrinsic Amplitude: 4 mV
Lead Channel Sensing Intrinsic Amplitude: 8.125 mV
Lead Channel Sensing Intrinsic Amplitude: 8.125 mV
Lead Channel Setting Pacing Amplitude: 2 V
Lead Channel Setting Pacing Amplitude: 2 V
Lead Channel Setting Pacing Pulse Width: 0.4 ms
Lead Channel Setting Sensing Sensitivity: 0.9 mV
Zone Setting Status: 755011

## 2023-01-05 NOTE — Progress Notes (Signed)
Remote pacemaker transmission.   

## 2023-03-08 ENCOUNTER — Encounter: Payer: Self-pay | Admitting: Internal Medicine

## 2023-03-08 ENCOUNTER — Ambulatory Visit: Payer: Medicare Other | Attending: Internal Medicine | Admitting: Internal Medicine

## 2023-03-08 VITALS — BP 164/78 | HR 60 | Ht 74.0 in | Wt 193.6 lb

## 2023-03-08 DIAGNOSIS — R030 Elevated blood-pressure reading, without diagnosis of hypertension: Secondary | ICD-10-CM | POA: Diagnosis not present

## 2023-03-08 DIAGNOSIS — R079 Chest pain, unspecified: Secondary | ICD-10-CM | POA: Diagnosis not present

## 2023-03-08 DIAGNOSIS — I495 Sick sinus syndrome: Secondary | ICD-10-CM

## 2023-03-08 DIAGNOSIS — Z950 Presence of cardiac pacemaker: Secondary | ICD-10-CM

## 2023-03-08 LAB — CUP PACEART INCLINIC DEVICE CHECK
Battery Remaining Longevity: 104 mo
Battery Voltage: 2.99 V
Brady Statistic AP VP Percent: 86.94 %
Brady Statistic AP VS Percent: 12.87 %
Brady Statistic AS VP Percent: 0.1 %
Brady Statistic AS VS Percent: 0.08 %
Brady Statistic RA Percent Paced: 99.83 %
Brady Statistic RV Percent Paced: 87.05 %
Date Time Interrogation Session: 20241001162244
Implantable Lead Connection Status: 753985
Implantable Lead Connection Status: 753985
Implantable Lead Implant Date: 20200203
Implantable Lead Implant Date: 20200203
Implantable Lead Location: 753859
Implantable Lead Location: 753860
Implantable Lead Model: 5076
Implantable Lead Model: 5076
Implantable Pulse Generator Implant Date: 20200203
Lead Channel Impedance Value: 418 Ohm
Lead Channel Impedance Value: 418 Ohm
Lead Channel Impedance Value: 475 Ohm
Lead Channel Impedance Value: 551 Ohm
Lead Channel Pacing Threshold Amplitude: 0.625 V
Lead Channel Pacing Threshold Amplitude: 0.75 V
Lead Channel Pacing Threshold Pulse Width: 0.4 ms
Lead Channel Pacing Threshold Pulse Width: 0.4 ms
Lead Channel Sensing Intrinsic Amplitude: 5 mV
Lead Channel Sensing Intrinsic Amplitude: 5 mV
Lead Channel Sensing Intrinsic Amplitude: 7.125 mV
Lead Channel Sensing Intrinsic Amplitude: 7.5 mV
Lead Channel Setting Pacing Amplitude: 2 V
Lead Channel Setting Pacing Amplitude: 2 V
Lead Channel Setting Pacing Pulse Width: 0.4 ms
Lead Channel Setting Sensing Sensitivity: 0.9 mV
Zone Setting Status: 755011

## 2023-03-08 MED ORDER — AMLODIPINE BESYLATE 10 MG PO TABS: 10.0000 mg | ORAL_TABLET | Freq: Every day | ORAL | 3 refills | Status: AC

## 2023-03-08 MED ORDER — CLONIDINE HCL 0.1 MG PO TABS
0.1000 mg | ORAL_TABLET | Freq: Once | ORAL | Status: AC
Start: 2023-03-08 — End: 2023-03-08
  Administered 2023-03-08: 0.1 mg via ORAL

## 2023-03-08 MED ORDER — CARVEDILOL 12.5 MG PO TABS: 12.5000 mg | ORAL_TABLET | Freq: Two times a day (BID) | ORAL | 3 refills | Status: AC

## 2023-03-08 NOTE — Progress Notes (Signed)
Patient Care Team: Michael Carson, MD as PCP - General (Family Medicine) Michael Morton Michael Frieze, MD as PCP - Cardiology (Cardiology) Michael Salvia, MD as PCP - Electrophysiology (Clinical Cardiac Electrophysiology)   HPI  Michael Morton is a 82 y.o. male Seen in followup for pacemaker implanted at [facility] that was complicated by excessive rates and exercise associated chest discomfort.  This was addressed by treating office rate response with resolution of his chest discomfort.  He also has episodes where he feels like his heart rate decreases.  A ZIO patch was utilized and And shows some abrupt changes in heart rate but not clearly associated with exercise.  Dr. Marsa Morton had stopped his metoprolol.  His fatigue is better.     He has had increasing fatigue and dyspnea on exertion.  He also has limitations of activity related to a leg problem.  Denies chest pain.    DATE TEST EF    1/20  Echo  60-65%    1/20 MYOVIEW  74 % No Ischemia  4/20 LHC   LIMA-LAD; SVG PD patent SVG OM1 & OM2 occluded OM1 and OM2 stents patent   9/23 Echo  60-65%      Date Cr K Hgb  4/20 1.85 4.5 15.9   6/2}0 2.09  4.8    3/22 2.12 5.3   9/24 2.11 4.6 16.1     He is Michael Willifords father  Records and Results Reviewed   Past Medical History:  Diagnosis Date   Coronary artery disease    Diabetes mellitus without complication ([facility])    Hypercholesteremia    Hypertension    Pneumonia     Past Surgical History:  Procedure Laterality Date   APPENDECTOMY     BACK SURGERY     CARDIAC SURGERY     CORONARY ARTERY BYPASS GRAFT     KNEE SURGERY     POSTERIOR CERVICAL LAMINECTOMY FOR EPIDURAL ABSCESS N/A 11/03/2013   Procedure: POSTERIOR CERVICAL AND THORACIC EVACUATION OF EPIDURAL ABSCESS, CERVICAL SIX AND THORACIC FOUR LAMINECTOMIES;  Surgeon: Michael Hurt, MD;  Location: [facility] NEURO ORS;  Service: Neurosurgery;  Laterality: N/A;   ROTATOR CUFF REPAIR     TEE WITHOUT CARDIOVERSION N/A 11/12/2013    Procedure: TRANSESOPHAGEAL ECHOCARDIOGRAM (TEE);  Surgeon: Michael Masson, MD;  Location: [facility] ENDOSCOPY;  Service: Cardiovascular;  Laterality: N/A;    Current Meds  Medication Sig   acetaminophen (TYLENOL) 500 MG tablet Take 500 mg by mouth 2 (two) times daily.    acyclovir (ZOVIRAX) 400 MG tablet Take 400 mg by mouth daily.    AgaMatrix Ultra-Thin Lancets MISC Check BS twice daily Dx. 250.60, 250.70   amLODipine (NORVASC) 5 MG tablet Take 1 tablet (5 mg total) by mouth daily.   aspirin 81 MG tablet Take 81 mg by mouth daily.    atorvastatin (LIPITOR) 40 MG tablet TAKE 1 TABLET BY MOUTH ONCE DAILY   ezetimibe (ZETIA) 10 MG tablet TAKE 1 TABLET BY MOUTH DAILY   fenofibrate (TRICOR) 145 MG tablet Take 1 tablet by mouth daily.   furosemide (LASIX) 20 MG tablet Take 20 mg by mouth daily.    gabapentin (NEURONTIN) 300 MG capsule Take 300 mg by mouth as needed (for neuropathy).   glipiZIDE (GLUCOTROL XL) 10 MG 24 hr tablet TAKE 1 TABLET BY MOUTH ONCE DAILY   glucose blood (PRECISION QID TEST) test strip daily in the afternoon.   isosorbide dinitrate (ISORDIL) 30 MG tablet Take 2 tablets (  60 mg total) by mouth daily.   losartan (COZAAR) 25 MG tablet Take 1 tablet (25 mg total) by mouth daily.   metoprolol succinate (TOPROL-XL) 50 MG 24 hr tablet Take 50 mg by mouth in the morning and at bedtime.   nitroGLYCERIN (NITROSTAT) 0.4 MG SL tablet Place 0.4 mg under the tongue every 5 (five) minutes as needed for chest pain.   omeprazole (PRILOSEC) 40 MG capsule Take 1 capsule by mouth daily.   Potassium Citrate 15 MEQ (1620 MG) TBCR    sertraline (ZOLOFT) 50 MG tablet Take 50 mg by mouth daily.   sitaGLIPtin (JANUVIA) 50 MG tablet TAKE 1 TABLET BY MOUTH ONCE DAILY   tolterodine (DETROL LA) 4 MG 24 hr capsule Take 1 capsule by mouth daily.   traZODone (DESYREL) 100 MG tablet Take 100 mg by mouth at bedtime.    Allergies  Allergen Reactions   Ace Inhibitors Other (See Comments)    Unknown     Oxycodone Other (See Comments)    Patient states it makes him feel disoriented   Ticagrelor Other (See Comments)    Didn't like how he felt when taking      Review of Systems negative except from HPI and PMH  Physical Exam BP (!) 172/84   Pulse 60   Ht 6\' 2"  (1.88 m)   Wt 193 lb 9.6 oz (87.8 kg)   SpO2 98%   BMI 24.86 kg/m  Well developed and well nourished in no acute distress HENT normal Neck supple with JVP-flat Clear Device pocket well healed; without hematoma or erythema.  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  This has been inactivated previously to good benefit, after discussion we will try to activated again with very ginger settings as outlined above.  Depending on his response we will either revert to no rate response, keep or augment rate response.  His chronotropic incompetence is aggravated by his beta-blocker.  Given his blood pressure issues however, I am loath to stop it, we will discontinue his metoprolol however use carvedilol Soch that the alpha blockade be further improve blood pressure control.  His blood pressure is very high; we will give him a dose of clonidine and in addition to the beta-blocker change we will increase his amlodipine from 5--10.  Will Follow it, but I suspect he will need augmented blood pressure control probably in the form of hydralazine.   Will need to get renal involved with send make sure we maintain renal perfusion  Other potential explanations for his dyspnea and worsening fatigue could be progressive ischemia so we will check a Myoview and left ventricular dysfunction related to his pacing I will check an echocardiogram not withstanding the fact that he year ago it was okay  I am surprised that his hemoglobin I would be expected with his renal disease for it to be low and  with it being 16 I wonder whether he could have polycythemia.  I will curbside hematology

## 2023-03-08 NOTE — Patient Instructions (Addendum)
Medication Instructions:  Your physician has recommended you make the following change in your medication:   Your physician has recommended you make the following change in your medication:   ** Stop Amlodipine 5mg   ** Begin Amlodipine 10mg  - 1 tablet by mouth daily  ** Stop Metoprolol  ** Begin Carvedilol 12.5mg  - 1 tablet by mouth twice daily.   *If you need a refill on your cardiac medications before your next appointment, please call your pharmacy*   Lab Work: None ordered.  If you have labs (blood work) drawn today and your tests are completely normal, you will receive your results only by: MyChart Message (if you have MyChart) OR A paper copy in the mail If you have any lab test that is abnormal or we need to change your treatment, we will call you to review the results.   Testing/Procedures: Your physician has requested that you have a lexiscan myoview. For further information please visit [email or web address] Please follow instruction sheet, as given.   Your physician has requested that you have an echocardiogram. Echocardiography is a painless test that uses sound waves to create images of your heart. It provides your doctor with information about the size and shape of your heart and how well your heart's chambers and valves are working. This procedure takes approximately one hour. There are no restrictions for this procedure. Please do NOT wear cologne, perfume, aftershave, or lotions (deodorant is allowed). Please arrive 15 minutes prior to your appointment time.    Follow-Up: At [facility], you and your health needs are our priority.  As part of our continuing mission to provide you with exceptional heart care, we have created designated Provider Care Teams.  These Care Teams include your primary Cardiologist (physician) and Advanced Practice Providers (APPs -  Physician Assistants and Nurse Practitioners) who all work together to provide you with the care  you need, when you need it.  We recommend signing up for the patient portal called "MyChart".  Sign up information is provided on this After Visit Summary.  MyChart is used to connect with patients for Virtual Visits (Telemedicine).  Patients are able to view lab/test results, encounter notes, upcoming appointments, etc.  Non-urgent messages can be sent to your provider as well.   To learn more about what you can do with MyChart, go to [email or web address].    Your next appointment:   3 months with Virgina Norfolk or Francis Dowse, PA-C  Other Instructions If you do not like the changes made to your pacemaker today please call our device clinic at [phone].  Marland Kitchen

## 2023-03-17 ENCOUNTER — Telehealth (HOSPITAL_COMMUNITY): Payer: Self-pay | Admitting: *Deleted

## 2023-03-17 NOTE — Telephone Encounter (Signed)
Instructions for MPI study given to wife.

## 2023-03-21 ENCOUNTER — Ambulatory Visit (INDEPENDENT_AMBULATORY_CARE_PROVIDER_SITE_OTHER): Payer: Medicare Other

## 2023-03-21 DIAGNOSIS — I442 Atrioventricular block, complete: Secondary | ICD-10-CM | POA: Diagnosis not present

## 2023-03-23 LAB — CUP PACEART REMOTE DEVICE CHECK
Battery Remaining Longevity: 103 mo
Battery Voltage: 2.99 V
Brady Statistic AP VP Percent: 60.55 %
Brady Statistic AP VS Percent: 39.29 %
Brady Statistic AS VP Percent: 0.14 %
Brady Statistic AS VS Percent: 0.02 %
Brady Statistic RA Percent Paced: 99.83 %
Brady Statistic RV Percent Paced: 60.69 %
Date Time Interrogation Session: 20241014041313
Implantable Lead Connection Status: 753985
Implantable Lead Connection Status: 753985
Implantable Lead Implant Date: 20200203
Implantable Lead Implant Date: 20200203
Implantable Lead Location: 753859
Implantable Lead Location: 753860
Implantable Lead Model: 5076
Implantable Lead Model: 5076
Implantable Pulse Generator Implant Date: 20200203
Lead Channel Impedance Value: 361 Ohm
Lead Channel Impedance Value: 399 Ohm
Lead Channel Impedance Value: 456 Ohm
Lead Channel Impedance Value: 475 Ohm
Lead Channel Pacing Threshold Amplitude: 0.625 V
Lead Channel Pacing Threshold Amplitude: 0.75 V
Lead Channel Pacing Threshold Pulse Width: 0.4 ms
Lead Channel Pacing Threshold Pulse Width: 0.4 ms
Lead Channel Sensing Intrinsic Amplitude: 5.75 mV
Lead Channel Sensing Intrinsic Amplitude: 5.75 mV
Lead Channel Sensing Intrinsic Amplitude: 7 mV
Lead Channel Sensing Intrinsic Amplitude: 7 mV
Lead Channel Setting Pacing Amplitude: 2 V
Lead Channel Setting Pacing Amplitude: 2 V
Lead Channel Setting Pacing Pulse Width: 0.4 ms
Lead Channel Setting Sensing Sensitivity: 0.9 mV
Zone Setting Status: 755011

## 2023-03-24 ENCOUNTER — Ambulatory Visit (HOSPITAL_COMMUNITY): Payer: Medicare Other | Attending: Internal Medicine

## 2023-03-24 ENCOUNTER — Ambulatory Visit (HOSPITAL_BASED_OUTPATIENT_CLINIC_OR_DEPARTMENT_OTHER): Payer: Medicare Other

## 2023-03-24 ENCOUNTER — Telehealth: Payer: Self-pay | Admitting: Internal Medicine

## 2023-03-24 DIAGNOSIS — R079 Chest pain, unspecified: Secondary | ICD-10-CM | POA: Diagnosis present

## 2023-03-24 LAB — ECHOCARDIOGRAM COMPLETE
Area-P 1/2: 2.78 cm2
S' Lateral: 2.8 cm

## 2023-03-24 LAB — MYOCARDIAL PERFUSION IMAGING
LV dias vol: 76 mL (ref 62–150)
LV sys vol: 33 mL
Nuc Stress EF: 57 %
Peak HR: 60 {beats}/min
Rest HR: 60 {beats}/min
Rest Nuclear Isotope Dose: 9.7 mCi
SDS: 2
SRS: 0
SSS: 2
ST Depression (mm): 0 mm
Stress Nuclear Isotope Dose: 31.4 mCi
TID: 0.95

## 2023-03-24 MED ORDER — TECHNETIUM TC 99M TETROFOSMIN IV KIT
31.4000 | PACK | Freq: Once | INTRAVENOUS | Status: AC | PRN
  Administered 2023-03-24: 31.4 via INTRAVENOUS

## 2023-03-24 MED ORDER — TECHNETIUM TC 99M TETROFOSMIN IV KIT
9.7000 | PACK | Freq: Once | INTRAVENOUS | Status: AC | PRN
  Administered 2023-03-24: 9.7 via INTRAVENOUS

## 2023-03-24 MED ORDER — REGADENOSON 0.4 MG/5ML IV SOLN
0.4000 mg | Freq: Once | INTRAVENOUS | Status: AC
  Administered 2023-03-24: 0.4 mg via INTRAVENOUS

## 2023-03-24 MED ORDER — PERFLUTREN LIPID MICROSPHERE
1.0000 mL | INTRAVENOUS | Status: AC | PRN
  Administered 2023-03-24: 2 mL via INTRAVENOUS

## 2023-03-24 NOTE — Telephone Encounter (Signed)
Patient dropped off his BP reading for Dr. Graciela Husbands.

## 2023-03-25 NOTE — Telephone Encounter (Signed)
Received and will have Dr Graciela Husbands review.

## 2023-04-01 ENCOUNTER — Other Ambulatory Visit: Payer: Self-pay | Admitting: Cardiology

## 2023-04-01 DIAGNOSIS — E782 Mixed hyperlipidemia: Secondary | ICD-10-CM

## 2023-04-06 NOTE — Progress Notes (Signed)
Remote pacemaker transmission.   

## 2023-06-07 NOTE — Progress Notes (Signed)
  Electrophysiology Office Note:   ID:  Michael Morton, DOB Apr 26, 1941, MRN [id number]  Primary Cardiologist: Redell Shallow, MD Electrophysiologist: Elspeth Sage, MD      History of Present Illness:   Michael Morton is a 82 y.o. 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   Since last being seen in our clinic the patient reports doing about the same. BP has been 120-130s primarily at home. Initially elevated today and improved with rest. Not sure if taking losartan  am or pm. .  he denies chest pain, palpitations, dyspnea, PND, orthopnea, nausea, vomiting, dizziness, syncope, edema, weight gain, or early satiety.   Review of systems complete and found to be negative unless listed in HPI.   EP Information / Studies Reviewed:    EKG is not ordered today. EKG from 03/08/2023 reviewed which showed A pacing with 1st degree AV block at 60 bpm       PPM Interrogation-  reviewed in detail today,  See PACEART report.  Device History: Medtronic Dual Chamber PPM implanted 07/2018 for CHB  Echo 03/24/2023 LVEF 55-60%, grade 1 DD, normal RV, mild LAE, trivial MR  Myoview  03/24/2023 Low risk.   Physical Exam:   VS:  BP 138/72   Pulse 67   Ht 6' 2 (1.88 m)   Wt 194 lb (88 kg)   SpO2 97%   BMI 24.91 kg/m    Wt Readings from Last 3 Encounters:  06/09/23 194 lb (88 kg)  03/24/23 193 lb (87.5 kg)  03/08/23 193 lb 9.6 oz (87.8 kg)     GEN: Well nourished, well developed in no acute distress NECK: No JVD; No carotid bruits CARDIAC: Regular rate and rhythm, no murmurs, rubs, gallops RESPIRATORY:  Clear to auscultation without rales, wheezing or rhonchi  ABDOMEN: Soft, non-tender, non-distended EXTREMITIES:  No edema; No deformity   ASSESSMENT AND PLAN:    CHB s/p Medtronic PPM  Normal PPM function See Pace Art report No changes today  CAD s/p CABG Denies s/s ischemia  CKD III Follow  HTN Stable  on current regimen  Keep tracking BP, if > 140 regularly can go up on coreg  or losartan    Disposition:   Follow up with EP APP in 6 months  Signed, Ozell Prentice Passey, PA-C

## 2023-06-09 ENCOUNTER — Ambulatory Visit: Payer: Medicare Other | Attending: Student | Admitting: Student

## 2023-06-09 ENCOUNTER — Encounter: Payer: Self-pay | Admitting: Student

## 2023-06-09 VITALS — BP 138/72 | HR 67 | Ht 74.0 in | Wt 194.0 lb

## 2023-06-09 DIAGNOSIS — R079 Chest pain, unspecified: Secondary | ICD-10-CM

## 2023-06-09 DIAGNOSIS — I10 Essential (primary) hypertension: Secondary | ICD-10-CM

## 2023-06-09 DIAGNOSIS — Z950 Presence of cardiac pacemaker: Secondary | ICD-10-CM | POA: Diagnosis not present

## 2023-06-09 DIAGNOSIS — I495 Sick sinus syndrome: Secondary | ICD-10-CM | POA: Diagnosis not present

## 2023-06-09 DIAGNOSIS — I442 Atrioventricular block, complete: Secondary | ICD-10-CM | POA: Diagnosis not present

## 2023-06-09 DIAGNOSIS — R0609 Other forms of dyspnea: Secondary | ICD-10-CM

## 2023-06-09 LAB — CUP PACEART INCLINIC DEVICE CHECK
Battery Remaining Longevity: 102 mo
Battery Voltage: 2.99 V
Brady Statistic AP VP Percent: 61.83 %
Brady Statistic AP VS Percent: 37.22 %
Brady Statistic AS VP Percent: 0.61 %
Brady Statistic AS VS Percent: 0.34 %
Brady Statistic RA Percent Paced: 99.03 %
Brady Statistic RV Percent Paced: 62.43 %
Date Time Interrogation Session: 20250102084441
Implantable Lead Connection Status: 753985
Implantable Lead Connection Status: 753985
Implantable Lead Implant Date: 20200203
Implantable Lead Implant Date: 20200203
Implantable Lead Location: 753859
Implantable Lead Location: 753860
Implantable Lead Model: 5076
Implantable Lead Model: 5076
Implantable Pulse Generator Implant Date: 20200203
Lead Channel Impedance Value: 399 Ohm
Lead Channel Impedance Value: 399 Ohm
Lead Channel Impedance Value: 475 Ohm
Lead Channel Impedance Value: 532 Ohm
Lead Channel Pacing Threshold Amplitude: 0.625 V
Lead Channel Pacing Threshold Amplitude: 0.875 V
Lead Channel Pacing Threshold Pulse Width: 0.4 ms
Lead Channel Pacing Threshold Pulse Width: 0.4 ms
Lead Channel Sensing Intrinsic Amplitude: 4.25 mV
Lead Channel Sensing Intrinsic Amplitude: 4.25 mV
Lead Channel Sensing Intrinsic Amplitude: 5.875 mV
Lead Channel Sensing Intrinsic Amplitude: 5.875 mV
Lead Channel Setting Pacing Amplitude: 2 V
Lead Channel Setting Pacing Amplitude: 2 V
Lead Channel Setting Pacing Pulse Width: 0.4 ms
Lead Channel Setting Sensing Sensitivity: 0.9 mV
Zone Setting Status: 755011

## 2023-06-09 NOTE — Patient Instructions (Signed)
 Medication Instructions:  Your physician recommends that you continue on your current medications as directed. Please refer to the Current Medication list given to you today.  *If you need a refill on your cardiac medications before your next appointment, please call your pharmacy*   Lab Work: None ordered.  If you have labs (blood work) drawn today and your tests are completely normal, you will receive your results only by: MyChart Message (if you have MyChart) OR A paper copy in the mail If you have any lab test that is abnormal or we need to change your treatment, we will call you to review the results.   Testing/Procedures: None ordered.    Follow-Up: At [facility], you and your health needs are our priority.  As part of our continuing mission to provide you with exceptional heart care, we have created designated Provider Care Teams.  These Care Teams include your primary Cardiologist (physician) and Advanced Practice Providers (APPs -  Physician Assistants and Nurse Practitioners) who all work together to provide you with the care you need, when you need it.  We recommend signing up for the patient portal called "MyChart".  Sign up information is provided on this After Visit Summary.  MyChart is used to connect with patients for Virtual Visits (Telemedicine).  Patients are able to view lab/test results, encounter notes, upcoming appointments, etc.  Non-urgent messages can be sent to your provider as well.   To learn more about what you can do with MyChart, go to [email or web address].    Your next appointment:   6 months with Otilio Saber, PA-C

## 2023-06-20 ENCOUNTER — Ambulatory Visit (INDEPENDENT_AMBULATORY_CARE_PROVIDER_SITE_OTHER): Payer: Medicare Other

## 2023-06-20 DIAGNOSIS — I442 Atrioventricular block, complete: Secondary | ICD-10-CM

## 2023-06-21 LAB — CUP PACEART REMOTE DEVICE CHECK
Battery Remaining Longevity: 101 mo
Battery Voltage: 2.98 V
Brady Statistic AP VP Percent: 57.98 %
Brady Statistic AP VS Percent: 41.3 %
Brady Statistic AS VP Percent: 0.38 %
Brady Statistic AS VS Percent: 0.33 %
Brady Statistic RA Percent Paced: 99.27 %
Brady Statistic RV Percent Paced: 58.37 %
Date Time Interrogation Session: 20250113061157
Implantable Lead Connection Status: 753985
Implantable Lead Connection Status: 753985
Implantable Lead Implant Date: 20200203
Implantable Lead Implant Date: 20200203
Implantable Lead Location: 753859
Implantable Lead Location: 753860
Implantable Lead Model: 5076
Implantable Lead Model: 5076
Implantable Pulse Generator Implant Date: 20200203
Lead Channel Impedance Value: 380 Ohm
Lead Channel Impedance Value: 399 Ohm
Lead Channel Impedance Value: 494 Ohm
Lead Channel Impedance Value: 513 Ohm
Lead Channel Pacing Threshold Amplitude: 0.625 V
Lead Channel Pacing Threshold Amplitude: 0.875 V
Lead Channel Pacing Threshold Pulse Width: 0.4 ms
Lead Channel Pacing Threshold Pulse Width: 0.4 ms
Lead Channel Sensing Intrinsic Amplitude: 4.25 mV
Lead Channel Sensing Intrinsic Amplitude: 4.25 mV
Lead Channel Sensing Intrinsic Amplitude: 7.5 mV
Lead Channel Sensing Intrinsic Amplitude: 7.5 mV
Lead Channel Setting Pacing Amplitude: 2 V
Lead Channel Setting Pacing Amplitude: 2 V
Lead Channel Setting Pacing Pulse Width: 0.4 ms
Lead Channel Setting Sensing Sensitivity: 0.9 mV
Zone Setting Status: 755011

## 2023-07-29 ENCOUNTER — Other Ambulatory Visit: Payer: Self-pay

## 2023-08-03 ENCOUNTER — Encounter: Payer: Self-pay | Admitting: Internal Medicine

## 2023-08-03 NOTE — Progress Notes (Signed)
 Remote pacemaker transmission.

## 2023-08-03 NOTE — Addendum Note (Signed)
 Addended by: Geralyn Flash D on: 08/03/2023 10:17 AM   Modules accepted: Orders

## 2023-09-19 ENCOUNTER — Ambulatory Visit (INDEPENDENT_AMBULATORY_CARE_PROVIDER_SITE_OTHER): Payer: Self-pay

## 2023-09-19 DIAGNOSIS — I442 Atrioventricular block, complete: Secondary | ICD-10-CM

## 2023-09-21 LAB — CUP PACEART REMOTE DEVICE CHECK
Battery Remaining Longevity: 92 mo
Battery Voltage: 2.98 V
Brady Statistic AP VP Percent: 76.2 %
Brady Statistic AP VS Percent: 21.74 %
Brady Statistic AS VP Percent: 1.6 %
Brady Statistic AS VS Percent: 0.46 %
Brady Statistic RA Percent Paced: 97.9 %
Brady Statistic RV Percent Paced: 77.8 %
Date Time Interrogation Session: 20250414063429
Implantable Lead Connection Status: 753985
Implantable Lead Connection Status: 753985
Implantable Lead Implant Date: 20200203
Implantable Lead Implant Date: 20200203
Implantable Lead Location: 753859
Implantable Lead Location: 753860
Implantable Lead Model: 5076
Implantable Lead Model: 5076
Implantable Pulse Generator Implant Date: 20200203
Lead Channel Impedance Value: 380 Ohm
Lead Channel Impedance Value: 399 Ohm
Lead Channel Impedance Value: 475 Ohm
Lead Channel Impedance Value: 494 Ohm
Lead Channel Pacing Threshold Amplitude: 0.625 V
Lead Channel Pacing Threshold Amplitude: 1 V
Lead Channel Pacing Threshold Pulse Width: 0.4 ms
Lead Channel Pacing Threshold Pulse Width: 0.4 ms
Lead Channel Sensing Intrinsic Amplitude: 4.25 mV
Lead Channel Sensing Intrinsic Amplitude: 4.25 mV
Lead Channel Sensing Intrinsic Amplitude: 6.25 mV
Lead Channel Sensing Intrinsic Amplitude: 6.25 mV
Lead Channel Setting Pacing Amplitude: 2 V
Lead Channel Setting Pacing Amplitude: 2 V
Lead Channel Setting Pacing Pulse Width: 0.4 ms
Lead Channel Setting Sensing Sensitivity: 0.9 mV
Zone Setting Status: 755011

## 2023-09-29 ENCOUNTER — Encounter: Payer: Self-pay | Admitting: Internal Medicine

## 2023-11-09 NOTE — Progress Notes (Signed)
 Remote pacemaker transmission.

## 2023-11-09 NOTE — Addendum Note (Signed)
 Addended by: Edra Govern D on: 11/09/2023 05:24 PM   Modules accepted: Orders

## 2023-12-19 ENCOUNTER — Ambulatory Visit (INDEPENDENT_AMBULATORY_CARE_PROVIDER_SITE_OTHER): Payer: Self-pay

## 2023-12-19 DIAGNOSIS — I442 Atrioventricular block, complete: Secondary | ICD-10-CM

## 2023-12-20 LAB — CUP PACEART REMOTE DEVICE CHECK
Battery Remaining Longevity: 87 mo
Battery Voltage: 2.98 V
Brady Statistic AP VP Percent: 79.99 %
Brady Statistic AP VS Percent: 17.73 %
Brady Statistic AS VP Percent: 1.88 %
Brady Statistic AS VS Percent: 0.4 %
Brady Statistic RA Percent Paced: 97.69 %
Brady Statistic RV Percent Paced: 81.87 %
Date Time Interrogation Session: 20250714054258
Implantable Lead Connection Status: 753985
Implantable Lead Connection Status: 753985
Implantable Lead Implant Date: 20200203
Implantable Lead Implant Date: 20200203
Implantable Lead Location: 753859
Implantable Lead Location: 753860
Implantable Lead Model: 5076
Implantable Lead Model: 5076
Implantable Pulse Generator Implant Date: 20200203
Lead Channel Impedance Value: 380 Ohm
Lead Channel Impedance Value: 380 Ohm
Lead Channel Impedance Value: 456 Ohm
Lead Channel Impedance Value: 475 Ohm
Lead Channel Pacing Threshold Amplitude: 0.625 V
Lead Channel Pacing Threshold Amplitude: 1.125 V
Lead Channel Pacing Threshold Pulse Width: 0.4 ms
Lead Channel Pacing Threshold Pulse Width: 0.4 ms
Lead Channel Sensing Intrinsic Amplitude: 5.125 mV
Lead Channel Sensing Intrinsic Amplitude: 5.125 mV
Lead Channel Sensing Intrinsic Amplitude: 7.375 mV
Lead Channel Sensing Intrinsic Amplitude: 7.375 mV
Lead Channel Setting Pacing Amplitude: 2 V
Lead Channel Setting Pacing Amplitude: 2.25 V
Lead Channel Setting Pacing Pulse Width: 0.4 ms
Lead Channel Setting Sensing Sensitivity: 0.9 mV
Zone Setting Status: 755011

## 2024-03-15 NOTE — Progress Notes (Signed)
 Remote PPM Transmission

## 2024-03-19 ENCOUNTER — Ambulatory Visit: Payer: Self-pay

## 2024-03-19 DIAGNOSIS — I442 Atrioventricular block, complete: Secondary | ICD-10-CM

## 2024-03-19 LAB — CUP PACEART REMOTE DEVICE CHECK
Battery Remaining Longevity: 84 mo
Battery Voltage: 2.98 V
Brady Statistic AP VP Percent: 59.37 %
Brady Statistic AP VS Percent: 38.45 %
Brady Statistic AS VP Percent: 0.79 %
Brady Statistic AS VS Percent: 1.39 %
Brady Statistic RA Percent Paced: 97.79 %
Brady Statistic RV Percent Paced: 60.16 %
Date Time Interrogation Session: 20251013071729
Implantable Lead Connection Status: 753985
Implantable Lead Connection Status: 753985
Implantable Lead Implant Date: 20200203
Implantable Lead Implant Date: 20200203
Implantable Lead Location: 753859
Implantable Lead Location: 753860
Implantable Lead Model: 5076
Implantable Lead Model: 5076
Implantable Pulse Generator Implant Date: 20200203
Lead Channel Impedance Value: 380 Ohm
Lead Channel Impedance Value: 399 Ohm
Lead Channel Impedance Value: 456 Ohm
Lead Channel Impedance Value: 475 Ohm
Lead Channel Pacing Threshold Amplitude: 0.625 V
Lead Channel Pacing Threshold Amplitude: 0.875 V
Lead Channel Pacing Threshold Pulse Width: 0.4 ms
Lead Channel Pacing Threshold Pulse Width: 0.4 ms
Lead Channel Sensing Intrinsic Amplitude: 4.625 mV
Lead Channel Sensing Intrinsic Amplitude: 4.625 mV
Lead Channel Sensing Intrinsic Amplitude: 6.5 mV
Lead Channel Sensing Intrinsic Amplitude: 6.5 mV
Lead Channel Setting Pacing Amplitude: 2 V
Lead Channel Setting Pacing Amplitude: 2 V
Lead Channel Setting Pacing Pulse Width: 0.4 ms
Lead Channel Setting Sensing Sensitivity: 0.9 mV
Zone Setting Status: 755011

## 2024-03-21 NOTE — Progress Notes (Signed)
 Remote PPM Transmission

## 2024-04-14 ENCOUNTER — Ambulatory Visit: Payer: Self-pay | Admitting: Cardiology

## 2024-04-15 ENCOUNTER — Ambulatory Visit: Payer: Self-pay | Admitting: Student in an Organized Health Care Education/Training Program

## 2024-06-18 ENCOUNTER — Ambulatory Visit (INDEPENDENT_AMBULATORY_CARE_PROVIDER_SITE_OTHER): Payer: Self-pay

## 2024-06-18 DIAGNOSIS — I442 Atrioventricular block, complete: Secondary | ICD-10-CM

## 2024-06-19 LAB — CUP PACEART REMOTE DEVICE CHECK
Battery Remaining Longevity: 76 mo
Battery Voltage: 2.97 V
Brady Statistic AP VP Percent: 81.52 %
Brady Statistic AP VS Percent: 18.02 %
Brady Statistic AS VP Percent: 0.27 %
Brady Statistic AS VS Percent: 0.19 %
Brady Statistic RA Percent Paced: 99.55 %
Brady Statistic RV Percent Paced: 81.79 %
Date Time Interrogation Session: 20260111180322
Implantable Lead Connection Status: 753985
Implantable Lead Connection Status: 753985
Implantable Lead Implant Date: 20200203
Implantable Lead Implant Date: 20200203
Implantable Lead Location: 753859
Implantable Lead Location: 753860
Implantable Lead Model: 5076
Implantable Lead Model: 5076
Implantable Pulse Generator Implant Date: 20200203
Lead Channel Impedance Value: 361 Ohm
Lead Channel Impedance Value: 380 Ohm
Lead Channel Impedance Value: 418 Ohm
Lead Channel Impedance Value: 475 Ohm
Lead Channel Pacing Threshold Amplitude: 0.625 V
Lead Channel Pacing Threshold Amplitude: 0.875 V
Lead Channel Pacing Threshold Pulse Width: 0.4 ms
Lead Channel Pacing Threshold Pulse Width: 0.4 ms
Lead Channel Sensing Intrinsic Amplitude: 4.5 mV
Lead Channel Sensing Intrinsic Amplitude: 4.5 mV
Lead Channel Sensing Intrinsic Amplitude: 6.625 mV
Lead Channel Sensing Intrinsic Amplitude: 6.625 mV
Lead Channel Setting Pacing Amplitude: 2 V
Lead Channel Setting Pacing Amplitude: 2 V
Lead Channel Setting Pacing Pulse Width: 0.4 ms
Lead Channel Setting Sensing Sensitivity: 0.9 mV
Zone Setting Status: 755011

## 2024-06-20 ENCOUNTER — Ambulatory Visit: Payer: Self-pay | Admitting: Student in an Organized Health Care Education/Training Program

## 2024-06-22 NOTE — Progress Notes (Signed)
 Remote PPM Transmission
